# Patient Record
Sex: Female | Born: 1940 | Race: White | Hispanic: No | Marital: Married | Smoking: Never smoker
Health system: Southern US, Community
[De-identification: ages and names within clinical notes are randomized; demographics above are authoritative.]

## PROBLEM LIST (undated history)

## (undated) DIAGNOSIS — C801 Malignant (primary) neoplasm, unspecified: Secondary | ICD-10-CM

## (undated) DIAGNOSIS — Z923 Personal history of irradiation: Secondary | ICD-10-CM

## (undated) DIAGNOSIS — R112 Nausea with vomiting, unspecified: Secondary | ICD-10-CM

## (undated) DIAGNOSIS — E785 Hyperlipidemia, unspecified: Secondary | ICD-10-CM

## (undated) DIAGNOSIS — R002 Palpitations: Secondary | ICD-10-CM

## (undated) DIAGNOSIS — E1140 Type 2 diabetes mellitus with diabetic neuropathy, unspecified: Secondary | ICD-10-CM

## (undated) DIAGNOSIS — I10 Essential (primary) hypertension: Secondary | ICD-10-CM

## (undated) DIAGNOSIS — M24669 Ankylosis, unspecified knee: Secondary | ICD-10-CM

## (undated) DIAGNOSIS — D519 Vitamin B12 deficiency anemia, unspecified: Secondary | ICD-10-CM

## (undated) DIAGNOSIS — Z9889 Other specified postprocedural states: Secondary | ICD-10-CM

## (undated) DIAGNOSIS — M1990 Unspecified osteoarthritis, unspecified site: Secondary | ICD-10-CM

## (undated) HISTORY — PX: JOINT REPLACEMENT: SHX530

## (undated) HISTORY — DX: Personal history of irradiation: Z92.3

## (undated) HISTORY — DX: Malignant (primary) neoplasm, unspecified: C80.1

## (undated) HISTORY — PX: ABDOMINAL HYSTERECTOMY: SHX81

## (undated) HISTORY — PX: OTHER SURGICAL HISTORY: SHX169

## (undated) HISTORY — DX: Hyperlipidemia, unspecified: E78.5

---

## 1974-07-08 HISTORY — PX: TUBAL LIGATION: SHX77

## 1974-07-08 HISTORY — PX: URETHRAL DILATION: SUR417

## 1974-07-08 HISTORY — PX: OTHER SURGICAL HISTORY: SHX169

## 1987-07-09 HISTORY — PX: TOTAL ABDOMINAL HYSTERECTOMY W/ BILATERAL SALPINGOOPHORECTOMY: SHX83

## 1994-07-08 HISTORY — PX: CERVICAL FUSION: SHX112

## 1997-07-08 HISTORY — PX: KNEE ARTHROSCOPY: SUR90

## 1999-07-09 HISTORY — PX: CARDIAC CATHETERIZATION: SHX172

## 1999-09-13 ENCOUNTER — Ambulatory Visit (HOSPITAL_COMMUNITY): Admission: RE | Admit: 1999-09-13 | Discharge: 1999-09-13 | Payer: Self-pay | Admitting: Interventional Cardiology

## 1999-10-01 ENCOUNTER — Encounter: Admission: RE | Admit: 1999-10-01 | Discharge: 1999-12-30 | Payer: Self-pay

## 2000-06-11 ENCOUNTER — Other Ambulatory Visit: Admission: RE | Admit: 2000-06-11 | Discharge: 2000-06-11 | Payer: Self-pay | Admitting: Obstetrics and Gynecology

## 2001-08-27 ENCOUNTER — Encounter: Admission: RE | Admit: 2001-08-27 | Discharge: 2001-11-25 | Payer: Self-pay | Admitting: Internal Medicine

## 2004-08-15 ENCOUNTER — Ambulatory Visit (HOSPITAL_COMMUNITY): Admission: RE | Admit: 2004-08-15 | Discharge: 2004-08-15 | Payer: Self-pay | Admitting: Gastroenterology

## 2006-02-03 ENCOUNTER — Inpatient Hospital Stay (HOSPITAL_COMMUNITY): Admission: RE | Admit: 2006-02-03 | Discharge: 2006-02-06 | Payer: Self-pay | Admitting: Orthopedic Surgery

## 2006-02-03 HISTORY — PX: TOTAL KNEE ARTHROPLASTY: SHX125

## 2006-04-02 ENCOUNTER — Ambulatory Visit (HOSPITAL_COMMUNITY): Admission: RE | Admit: 2006-04-02 | Discharge: 2006-04-02 | Payer: Self-pay | Admitting: Orthopedic Surgery

## 2006-04-02 HISTORY — PX: MANIPULATION KNEE JOINT: SUR841

## 2007-07-08 ENCOUNTER — Encounter: Admission: RE | Admit: 2007-07-08 | Discharge: 2007-07-08 | Payer: Self-pay | Admitting: Internal Medicine

## 2007-07-16 ENCOUNTER — Encounter: Admission: RE | Admit: 2007-07-16 | Discharge: 2007-07-16 | Payer: Self-pay | Admitting: Internal Medicine

## 2007-07-27 ENCOUNTER — Ambulatory Visit (HOSPITAL_COMMUNITY): Admission: RE | Admit: 2007-07-27 | Discharge: 2007-07-27 | Payer: Self-pay | Admitting: General Surgery

## 2007-07-27 ENCOUNTER — Encounter (INDEPENDENT_AMBULATORY_CARE_PROVIDER_SITE_OTHER): Payer: Self-pay | Admitting: General Surgery

## 2007-07-27 HISTORY — PX: BREAST LUMPECTOMY: SHX2

## 2007-08-14 ENCOUNTER — Ambulatory Visit: Payer: Self-pay | Admitting: Oncology

## 2007-08-20 ENCOUNTER — Ambulatory Visit (HOSPITAL_COMMUNITY): Admission: RE | Admit: 2007-08-20 | Discharge: 2007-08-20 | Payer: Self-pay | Admitting: General Surgery

## 2007-08-25 ENCOUNTER — Ambulatory Visit: Admission: RE | Admit: 2007-08-25 | Discharge: 2007-09-22 | Payer: Self-pay | Admitting: Radiation Oncology

## 2007-09-14 LAB — COMPREHENSIVE METABOLIC PANEL
ALT: 15 U/L (ref 0–35)
AST: 17 U/L (ref 0–37)
Albumin: 3.8 g/dL (ref 3.5–5.2)
Alkaline Phosphatase: 56 U/L (ref 39–117)
BUN: 20 mg/dL (ref 6–23)
CO2: 22 mEq/L (ref 19–32)
Calcium: 8.9 mg/dL (ref 8.4–10.5)
Chloride: 104 mEq/L (ref 96–112)
Creatinine, Ser: 0.91 mg/dL (ref 0.40–1.20)
Glucose, Bld: 120 mg/dL — ABNORMAL HIGH (ref 70–99)
Potassium: 4.2 mEq/L (ref 3.5–5.3)
Sodium: 141 mEq/L (ref 135–145)
Total Bilirubin: 0.5 mg/dL (ref 0.3–1.2)
Total Protein: 6.5 g/dL (ref 6.0–8.3)

## 2007-09-14 LAB — CBC WITH DIFFERENTIAL/PLATELET
BASO%: 0.4 % (ref 0.0–2.0)
Basophils Absolute: 0 10*3/uL (ref 0.0–0.1)
EOS%: 1.7 % (ref 0.0–7.0)
Eosinophils Absolute: 0.1 10*3/uL (ref 0.0–0.5)
HCT: 34.2 % — ABNORMAL LOW (ref 34.8–46.6)
HGB: 11.8 g/dL (ref 11.6–15.9)
LYMPH%: 25.8 % (ref 14.0–48.0)
MCH: 30.2 pg (ref 26.0–34.0)
MCHC: 34.3 g/dL (ref 32.0–36.0)
MCV: 87.8 fL (ref 81.0–101.0)
MONO#: 0.5 10*3/uL (ref 0.1–0.9)
MONO%: 6.9 % (ref 0.0–13.0)
NEUT#: 5.1 10*3/uL (ref 1.5–6.5)
NEUT%: 65.2 % (ref 39.6–76.8)
Platelets: 295 10*3/uL (ref 145–400)
RBC: 3.9 10*6/uL (ref 3.70–5.32)
RDW: 13.9 % (ref 11.3–14.5)
WBC: 7.8 10*3/uL (ref 3.9–10.0)
lymph#: 2 10*3/uL (ref 0.9–3.3)

## 2007-09-15 LAB — VITAMIN D 25 HYDROXY (VIT D DEFICIENCY, FRACTURES): Vit D, 25-Hydroxy: 13 ng/mL — ABNORMAL LOW (ref 30–89)

## 2007-09-17 LAB — VITAMIN D 1,25 DIHYDROXY: Vit D, 1,25-Dihydroxy: 25 pg/mL (ref 6–62)

## 2007-09-29 ENCOUNTER — Ambulatory Visit (HOSPITAL_COMMUNITY): Admission: RE | Admit: 2007-09-29 | Discharge: 2007-09-30 | Payer: Self-pay | Admitting: General Surgery

## 2007-09-29 ENCOUNTER — Encounter (INDEPENDENT_AMBULATORY_CARE_PROVIDER_SITE_OTHER): Payer: Self-pay | Admitting: General Surgery

## 2007-09-29 HISTORY — PX: OTHER SURGICAL HISTORY: SHX169

## 2007-12-15 ENCOUNTER — Ambulatory Visit: Payer: Self-pay | Admitting: Oncology

## 2007-12-22 ENCOUNTER — Ambulatory Visit (HOSPITAL_COMMUNITY): Admission: RE | Admit: 2007-12-22 | Discharge: 2007-12-22 | Payer: Self-pay | Admitting: Oncology

## 2007-12-22 LAB — COMPREHENSIVE METABOLIC PANEL
ALT: 19 U/L (ref 0–35)
AST: 15 U/L (ref 0–37)
Albumin: 4.1 g/dL (ref 3.5–5.2)
Alkaline Phosphatase: 54 U/L (ref 39–117)
BUN: 17 mg/dL (ref 6–23)
CO2: 26 mEq/L (ref 19–32)
Calcium: 10.5 mg/dL (ref 8.4–10.5)
Chloride: 103 mEq/L (ref 96–112)
Creatinine, Ser: 0.88 mg/dL (ref 0.40–1.20)
Glucose, Bld: 142 mg/dL — ABNORMAL HIGH (ref 70–99)
Potassium: 4.1 mEq/L (ref 3.5–5.3)
Sodium: 142 mEq/L (ref 135–145)
Total Bilirubin: 0.5 mg/dL (ref 0.3–1.2)
Total Protein: 6.9 g/dL (ref 6.0–8.3)

## 2007-12-22 LAB — CBC WITH DIFFERENTIAL/PLATELET
BASO%: 0.4 % (ref 0.0–2.0)
Basophils Absolute: 0 10*3/uL (ref 0.0–0.1)
EOS%: 0.9 % (ref 0.0–7.0)
Eosinophils Absolute: 0.1 10*3/uL (ref 0.0–0.5)
HCT: 35.6 % (ref 34.8–46.6)
HGB: 12.3 g/dL (ref 11.6–15.9)
LYMPH%: 29.5 % (ref 14.0–48.0)
MCH: 30.4 pg (ref 26.0–34.0)
MCHC: 34.5 g/dL (ref 32.0–36.0)
MCV: 88.1 fL (ref 81.0–101.0)
MONO#: 0.6 10*3/uL (ref 0.1–0.9)
MONO%: 7.6 % (ref 0.0–13.0)
NEUT#: 5.2 10*3/uL (ref 1.5–6.5)
NEUT%: 61.6 % (ref 39.6–76.8)
Platelets: 283 10*3/uL (ref 145–400)
RBC: 4.04 10*6/uL (ref 3.70–5.32)
RDW: 14.6 % — ABNORMAL HIGH (ref 11.3–14.5)
WBC: 8.4 10*3/uL (ref 3.9–10.0)
lymph#: 2.5 10*3/uL (ref 0.9–3.3)

## 2007-12-23 LAB — VITAMIN D 25 HYDROXY (VIT D DEFICIENCY, FRACTURES): Vit D, 25-Hydroxy: 34 ng/mL (ref 30–89)

## 2008-01-19 ENCOUNTER — Encounter: Admission: RE | Admit: 2008-01-19 | Discharge: 2008-01-19 | Payer: Self-pay | Admitting: Oncology

## 2008-07-07 ENCOUNTER — Ambulatory Visit: Payer: Self-pay | Admitting: Oncology

## 2008-07-12 ENCOUNTER — Ambulatory Visit (HOSPITAL_COMMUNITY): Admission: RE | Admit: 2008-07-12 | Discharge: 2008-07-12 | Payer: Self-pay | Admitting: Oncology

## 2008-07-12 LAB — COMPREHENSIVE METABOLIC PANEL
ALT: 19 U/L (ref 0–35)
AST: 20 U/L (ref 0–37)
Albumin: 3.6 g/dL (ref 3.5–5.2)
Alkaline Phosphatase: 55 U/L (ref 39–117)
BUN: 14 mg/dL (ref 6–23)
CO2: 25 mEq/L (ref 19–32)
Calcium: 9 mg/dL (ref 8.4–10.5)
Chloride: 102 mEq/L (ref 96–112)
Creatinine, Ser: 0.86 mg/dL (ref 0.40–1.20)
Glucose, Bld: 207 mg/dL — ABNORMAL HIGH (ref 70–99)
Potassium: 3.6 mEq/L (ref 3.5–5.3)
Sodium: 138 mEq/L (ref 135–145)
Total Bilirubin: 0.7 mg/dL (ref 0.3–1.2)
Total Protein: 6.7 g/dL (ref 6.0–8.3)

## 2008-07-12 LAB — CBC WITH DIFFERENTIAL/PLATELET
BASO%: 0.3 % (ref 0.0–2.0)
Basophils Absolute: 0 10*3/uL (ref 0.0–0.1)
EOS%: 0.9 % (ref 0.0–7.0)
Eosinophils Absolute: 0.1 10*3/uL (ref 0.0–0.5)
HCT: 37.1 % (ref 34.8–46.6)
HGB: 12.5 g/dL (ref 11.6–15.9)
LYMPH%: 30.3 % (ref 14.0–48.0)
MCH: 30 pg (ref 26.0–34.0)
MCHC: 33.8 g/dL (ref 32.0–36.0)
MCV: 88.7 fL (ref 81.0–101.0)
MONO#: 0.6 10*3/uL (ref 0.1–0.9)
MONO%: 7.6 % (ref 0.0–13.0)
NEUT#: 4.6 10*3/uL (ref 1.5–6.5)
NEUT%: 60.9 % (ref 39.6–76.8)
Platelets: 282 10*3/uL (ref 145–400)
RBC: 4.18 10*6/uL (ref 3.70–5.32)
RDW: 14.8 % — ABNORMAL HIGH (ref 11.3–14.5)
WBC: 7.6 10*3/uL (ref 3.9–10.0)
lymph#: 2.3 10*3/uL (ref 0.9–3.3)

## 2008-07-13 LAB — VITAMIN D 25 HYDROXY (VIT D DEFICIENCY, FRACTURES): Vit D, 25-Hydroxy: 39 ng/mL (ref 30–89)

## 2008-07-25 ENCOUNTER — Ambulatory Visit (HOSPITAL_BASED_OUTPATIENT_CLINIC_OR_DEPARTMENT_OTHER): Admission: RE | Admit: 2008-07-25 | Discharge: 2008-07-26 | Payer: Self-pay | Admitting: Plastic Surgery

## 2008-07-25 ENCOUNTER — Encounter (INDEPENDENT_AMBULATORY_CARE_PROVIDER_SITE_OTHER): Payer: Self-pay | Admitting: Plastic Surgery

## 2008-11-28 ENCOUNTER — Ambulatory Visit (HOSPITAL_BASED_OUTPATIENT_CLINIC_OR_DEPARTMENT_OTHER): Admission: RE | Admit: 2008-11-28 | Discharge: 2008-11-29 | Payer: Self-pay | Admitting: Plastic Surgery

## 2008-11-28 ENCOUNTER — Encounter (INDEPENDENT_AMBULATORY_CARE_PROVIDER_SITE_OTHER): Payer: Self-pay | Admitting: Plastic Surgery

## 2008-11-28 HISTORY — PX: OTHER SURGICAL HISTORY: SHX169

## 2008-12-13 ENCOUNTER — Ambulatory Visit: Payer: Self-pay | Admitting: Oncology

## 2008-12-15 LAB — CBC WITH DIFFERENTIAL/PLATELET
BASO%: 0.4 % (ref 0.0–2.0)
Basophils Absolute: 0 10*3/uL (ref 0.0–0.1)
EOS%: 1.2 % (ref 0.0–7.0)
Eosinophils Absolute: 0.1 10*3/uL (ref 0.0–0.5)
HCT: 33.4 % — ABNORMAL LOW (ref 34.8–46.6)
HGB: 11.8 g/dL (ref 11.6–15.9)
LYMPH%: 31.1 % (ref 14.0–49.7)
MCH: 31.4 pg (ref 25.1–34.0)
MCHC: 35.2 g/dL (ref 31.5–36.0)
MCV: 89.1 fL (ref 79.5–101.0)
MONO#: 0.6 10*3/uL (ref 0.1–0.9)
MONO%: 8.6 % (ref 0.0–14.0)
NEUT#: 3.8 10*3/uL (ref 1.5–6.5)
NEUT%: 58.7 % (ref 38.4–76.8)
Platelets: 246 10*3/uL (ref 145–400)
RBC: 3.75 10*6/uL (ref 3.70–5.45)
RDW: 14.7 % — ABNORMAL HIGH (ref 11.2–14.5)
WBC: 6.5 10*3/uL (ref 3.9–10.3)
lymph#: 2 10*3/uL (ref 0.9–3.3)

## 2008-12-15 LAB — COMPREHENSIVE METABOLIC PANEL
ALT: 15 U/L (ref 0–35)
AST: 14 U/L (ref 0–37)
Albumin: 3.6 g/dL (ref 3.5–5.2)
Alkaline Phosphatase: 57 U/L (ref 39–117)
BUN: 14 mg/dL (ref 6–23)
CO2: 25 mEq/L (ref 19–32)
Calcium: 9 mg/dL (ref 8.4–10.5)
Chloride: 102 mEq/L (ref 96–112)
Creatinine, Ser: 0.8 mg/dL (ref 0.40–1.20)
Glucose, Bld: 272 mg/dL — ABNORMAL HIGH (ref 70–99)
Potassium: 4.4 mEq/L (ref 3.5–5.3)
Sodium: 140 mEq/L (ref 135–145)
Total Bilirubin: 0.4 mg/dL (ref 0.3–1.2)
Total Protein: 6.3 g/dL (ref 6.0–8.3)

## 2009-02-02 ENCOUNTER — Encounter: Admission: RE | Admit: 2009-02-02 | Discharge: 2009-02-02 | Payer: Self-pay | Admitting: Oncology

## 2009-07-11 ENCOUNTER — Ambulatory Visit (HOSPITAL_COMMUNITY): Admission: RE | Admit: 2009-07-11 | Discharge: 2009-07-11 | Payer: Self-pay | Admitting: Oncology

## 2009-07-11 ENCOUNTER — Ambulatory Visit: Payer: Self-pay | Admitting: Oncology

## 2009-07-11 LAB — CBC WITH DIFFERENTIAL/PLATELET
BASO%: 0.5 % (ref 0.0–2.0)
Basophils Absolute: 0 10*3/uL (ref 0.0–0.1)
EOS%: 1.5 % (ref 0.0–7.0)
Eosinophils Absolute: 0.1 10*3/uL (ref 0.0–0.5)
HCT: 37.2 % (ref 34.8–46.6)
HGB: 12.7 g/dL (ref 11.6–15.9)
LYMPH%: 31.1 % (ref 14.0–49.7)
MCH: 30.3 pg (ref 25.1–34.0)
MCHC: 34.2 g/dL (ref 31.5–36.0)
MCV: 88.6 fL (ref 79.5–101.0)
MONO#: 0.6 10*3/uL (ref 0.1–0.9)
MONO%: 7.3 % (ref 0.0–14.0)
NEUT#: 4.6 10*3/uL (ref 1.5–6.5)
NEUT%: 59.6 % (ref 38.4–76.8)
Platelets: 312 10*3/uL (ref 145–400)
RBC: 4.2 10*6/uL (ref 3.70–5.45)
RDW: 14.2 % (ref 11.2–14.5)
WBC: 7.7 10*3/uL (ref 3.9–10.3)
lymph#: 2.4 10*3/uL (ref 0.9–3.3)

## 2009-07-11 LAB — COMPREHENSIVE METABOLIC PANEL
ALT: 14 U/L (ref 0–35)
AST: 14 U/L (ref 0–37)
Albumin: 3.8 g/dL (ref 3.5–5.2)
Alkaline Phosphatase: 59 U/L (ref 39–117)
BUN: 10 mg/dL (ref 6–23)
CO2: 21 mEq/L (ref 19–32)
Calcium: 9.8 mg/dL (ref 8.4–10.5)
Chloride: 102 mEq/L (ref 96–112)
Creatinine, Ser: 0.68 mg/dL (ref 0.40–1.20)
Glucose, Bld: 128 mg/dL — ABNORMAL HIGH (ref 70–99)
Potassium: 4.2 mEq/L (ref 3.5–5.3)
Sodium: 140 mEq/L (ref 135–145)
Total Bilirubin: 0.4 mg/dL (ref 0.3–1.2)
Total Protein: 6.3 g/dL (ref 6.0–8.3)

## 2009-07-14 ENCOUNTER — Ambulatory Visit (HOSPITAL_COMMUNITY): Admission: RE | Admit: 2009-07-14 | Discharge: 2009-07-14 | Payer: Self-pay | Admitting: Oncology

## 2009-09-04 IMAGING — PT NM PET TUM IMG SKULL BASE T - THIGH
6 series · 25 of 25 positions shown · non-contrast
Comparison: Chest, abdomen, and pelvis same day.

CLINICAL DATA: Left breast cancer/sarcoma. 
FDG PET-CT TUMOR IMAGING (SKULL BASE TO THIGHS):

Fasting Blood Glucose:  159
TECHNIQUE: 16.9 mCi F-18 FDG were administered via the right antecubital fossa.  Full ring PET imaging was performed from the skull base through the mid-thighs 55 minutes after injection.  CT data was obtained and used for attenuation correction and anatomic localization only.  (This was not acquired as a diagnostic CT examination.)

[Series 1: pet ac · axial · 3.3mm · 4.69mm/px · z∈[-877,-7]mm · 6 of 267 slices shown]
[im 1/267]
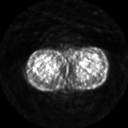
[im 54/267]
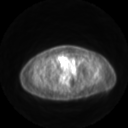
[im 107/267]
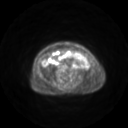
[im 160/267]
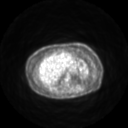
[im 213/267]
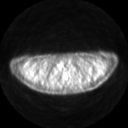
[im 267/267]
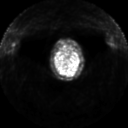

[Series 2: ct images · axial · 3.8mm · 0.98mm/px · z∈[-877,-8]mm · 6 of 267 slices shown]
[im 1/267]
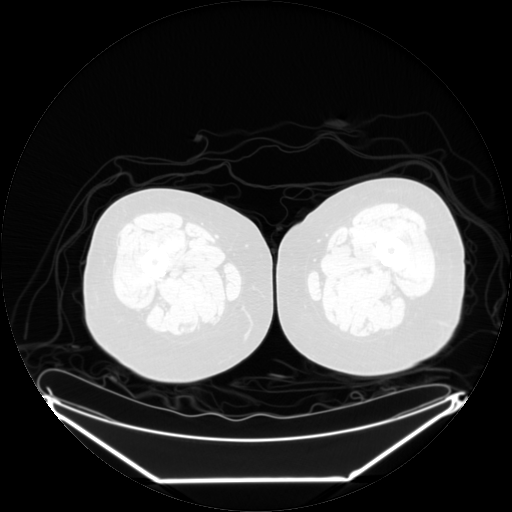
[im 54/267]
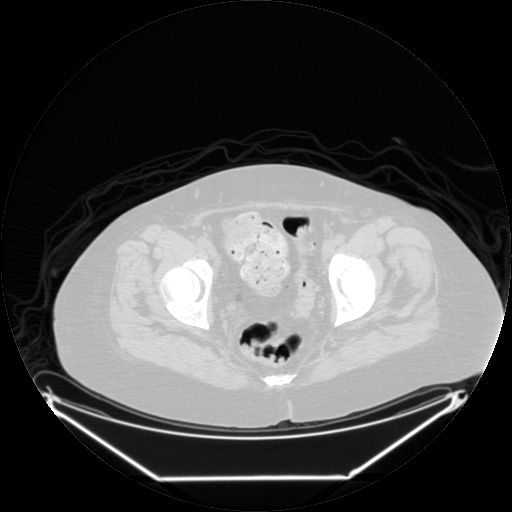
[im 107/267]
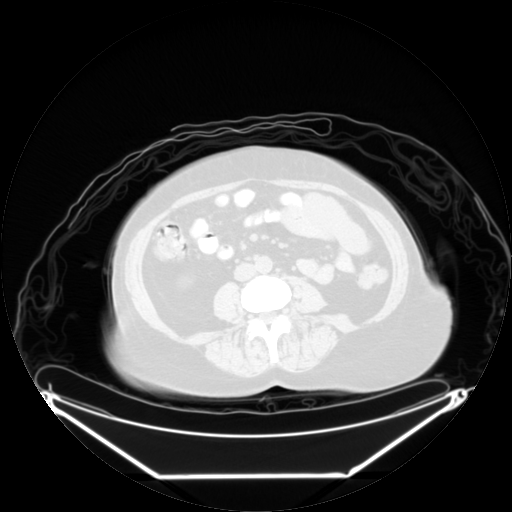
[im 160/267]
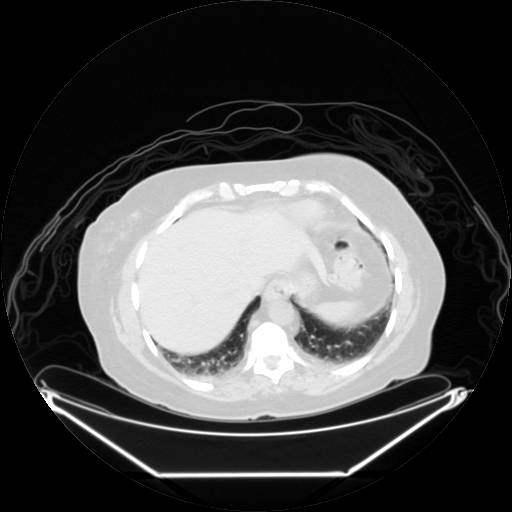
[im 213/267]
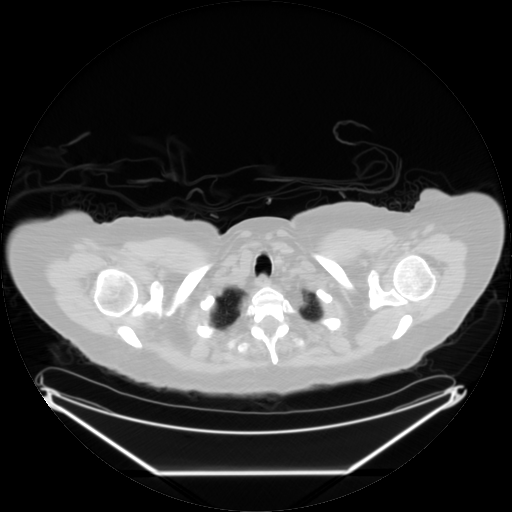
[im 267/267]
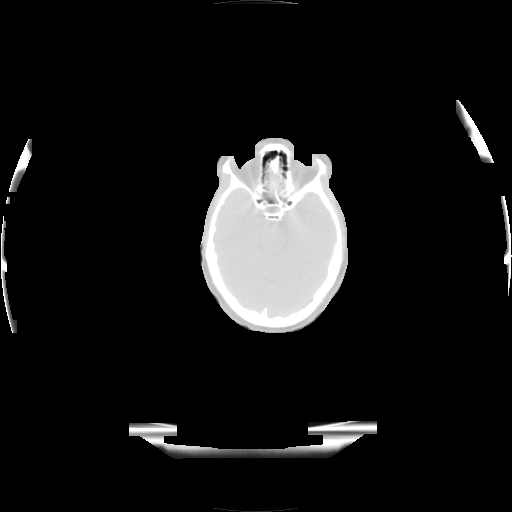

[Series 2: pet nac · axial · 3.3mm · 4.69mm/px · z∈[-877,-7]mm · 6 of 267 slices shown]
[im 1/267]
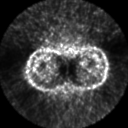
[im 54/267]
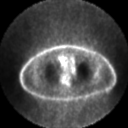
[im 107/267]
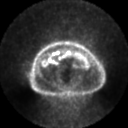
[im 160/267]
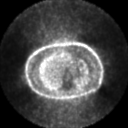
[im 213/267]
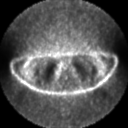
[im 267/267]
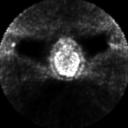

[Series 123: mip · coronal · 3.3mm · 4.69mm/px · 1 of 30 slices shown]
[im 1/30]
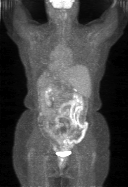

[Series 151: reformatted · axial · 3.3mm · 3.91mm/px · z∈[-786,-43]mm · 5 of 226 slices shown (1 of 2)]
[im 1/226]
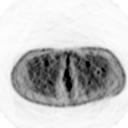
[im 57/226]
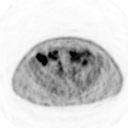
[im 113/226]
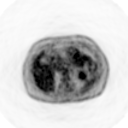
[im 169/226]
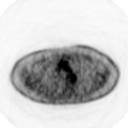
[im 226/226]
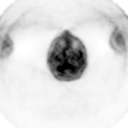

[Series 153: reformatted · coronal · 4.7mm · 6.98mm/px · 1 of 58 slices shown (2 of 2)]
[im 1/58]
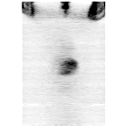

[25 of 25 positions shown; findings below may reference images not displayed]

FINDINGS: Neck:  No hypermetabolic lymph nodes within the neck. 
Chest: There is peripheral metabolic activity associated with the left breast postsurgical fluid collection, which is felt to be postsurgical inflammation.  No evidence of axillary lymphadenopathy.  No evidence of metastatic hypermetabolic lymphadenopathy.  
Abdomen:  No hypermetabolic focal activity within the solid organs. There is focal hypermetabolic activity within the second portion of the duodenum, which is intense (SUV max equal 7.8); however, there is no associated finding on the CT portion of the PET or the contrast enhanced CT of the same day. This is felt to be physiologic activity. Of note, there is intense physiologic activity in more distal small bowel additionally.
No hypermetabolic lymph nodes within the abdomen. 
Pelvis:  No hypermetabolic lymph nodes within the pelvis. 
Skeleton:  No focal activity within the skeleton to suggest skeletal metastasis.
IMPRESSION: 1.  No evidence of breast cancer metastasis. 
2.  Postsurgical hypermetabolic inflammation in the left breast.

## 2010-02-05 ENCOUNTER — Encounter: Admission: RE | Admit: 2010-02-05 | Discharge: 2010-02-05 | Payer: Self-pay | Admitting: Internal Medicine

## 2010-03-29 ENCOUNTER — Ambulatory Visit (HOSPITAL_COMMUNITY): Admission: RE | Admit: 2010-03-29 | Discharge: 2010-03-29 | Payer: Self-pay | Admitting: Gastroenterology

## 2010-05-10 ENCOUNTER — Ambulatory Visit: Payer: Self-pay | Admitting: Oncology

## 2010-05-16 LAB — CBC WITH DIFFERENTIAL/PLATELET
BASO%: 0.3 % (ref 0.0–2.0)
Basophils Absolute: 0 10*3/uL (ref 0.0–0.1)
EOS%: 1.1 % (ref 0.0–7.0)
Eosinophils Absolute: 0.1 10*3/uL (ref 0.0–0.5)
HCT: 38.3 % (ref 34.8–46.6)
HGB: 12.7 g/dL (ref 11.6–15.9)
LYMPH%: 29.3 % (ref 14.0–49.7)
MCH: 29.4 pg (ref 25.1–34.0)
MCHC: 33.1 g/dL (ref 31.5–36.0)
MCV: 88.8 fL (ref 79.5–101.0)
MONO#: 0.6 10*3/uL (ref 0.1–0.9)
MONO%: 7.6 % (ref 0.0–14.0)
NEUT#: 4.9 10*3/uL (ref 1.5–6.5)
NEUT%: 61.7 % (ref 38.4–76.8)
Platelets: 286 10*3/uL (ref 145–400)
RBC: 4.31 10*6/uL (ref 3.70–5.45)
RDW: 13.8 % (ref 11.2–14.5)
WBC: 7.9 10*3/uL (ref 3.9–10.3)
lymph#: 2.3 10*3/uL (ref 0.9–3.3)

## 2010-05-16 LAB — COMPREHENSIVE METABOLIC PANEL
ALT: 21 U/L (ref 0–35)
AST: 23 U/L (ref 0–37)
Albumin: 3.4 g/dL — ABNORMAL LOW (ref 3.5–5.2)
Alkaline Phosphatase: 64 U/L (ref 39–117)
BUN: 15 mg/dL (ref 6–23)
CO2: 28 mEq/L (ref 19–32)
Calcium: 9.6 mg/dL (ref 8.4–10.5)
Chloride: 102 mEq/L (ref 96–112)
Creatinine, Ser: 1.08 mg/dL (ref 0.40–1.20)
Glucose, Bld: 163 mg/dL — ABNORMAL HIGH (ref 70–99)
Potassium: 4 mEq/L (ref 3.5–5.3)
Sodium: 141 mEq/L (ref 135–145)
Total Bilirubin: 0.8 mg/dL (ref 0.3–1.2)
Total Protein: 6.5 g/dL (ref 6.0–8.3)

## 2010-05-17 LAB — VITAMIN D 25 HYDROXY (VIT D DEFICIENCY, FRACTURES): Vit D, 25-Hydroxy: 49 ng/mL (ref 30–89)

## 2010-05-21 ENCOUNTER — Ambulatory Visit (HOSPITAL_COMMUNITY): Admission: RE | Admit: 2010-05-21 | Discharge: 2010-05-21 | Payer: Self-pay | Admitting: Oncology

## 2010-10-16 LAB — GLUCOSE, CAPILLARY
Glucose-Capillary: 180 mg/dL — ABNORMAL HIGH (ref 70–99)
Glucose-Capillary: 190 mg/dL — ABNORMAL HIGH (ref 70–99)
Glucose-Capillary: 217 mg/dL — ABNORMAL HIGH (ref 70–99)
Glucose-Capillary: 228 mg/dL — ABNORMAL HIGH (ref 70–99)
Glucose-Capillary: 235 mg/dL — ABNORMAL HIGH (ref 70–99)
Glucose-Capillary: 327 mg/dL — ABNORMAL HIGH (ref 70–99)

## 2010-10-16 LAB — BASIC METABOLIC PANEL
BUN: 16 mg/dL (ref 6–23)
CO2: 26 mEq/L (ref 19–32)
Calcium: 9.2 mg/dL (ref 8.4–10.5)
Chloride: 102 mEq/L (ref 96–112)
Creatinine, Ser: 0.88 mg/dL (ref 0.4–1.2)
GFR calc Af Amer: >60 mL/min (ref >60)
GFR calc non Af Amer: >60 mL/min (ref >60)
Glucose, Bld: 293 mg/dL — ABNORMAL HIGH (ref 70–99)
Potassium: 4 mEq/L (ref 3.5–5.1)
Sodium: 139 mEq/L (ref 135–145)

## 2010-10-16 LAB — POCT HEMOGLOBIN-HEMACUE: Hemoglobin: 12.6 g/dL (ref 12.0–15.0)

## 2010-10-22 LAB — POCT I-STAT, CHEM 8
BUN: 17 mg/dL (ref 6–23)
Calcium, Ion: 1.17 mmol/L (ref 1.12–1.32)
Chloride: 105 mEq/L (ref 96–112)
Creatinine, Ser: 0.7 mg/dL (ref 0.4–1.2)
Glucose, Bld: 150 mg/dL — ABNORMAL HIGH (ref 70–99)
HCT: 37 % (ref 36.0–46.0)
Hemoglobin: 12.6 g/dL (ref 12.0–15.0)
Potassium: 3.9 mEq/L (ref 3.5–5.1)
Sodium: 140 mEq/L (ref 135–145)
TCO2: 26 mmol/L (ref 0–100)

## 2010-10-22 LAB — GLUCOSE, CAPILLARY: Glucose-Capillary: 143 mg/dL — ABNORMAL HIGH (ref 70–99)

## 2010-10-22 LAB — BASIC METABOLIC PANEL
BUN: 12 mg/dL (ref 6–23)
CO2: 27 mEq/L (ref 19–32)
Calcium: 9.6 mg/dL (ref 8.4–10.5)
Chloride: 104 mEq/L (ref 96–112)
Creatinine, Ser: 0.81 mg/dL (ref 0.4–1.2)
GFR calc Af Amer: >60 mL/min (ref >60)
GFR calc non Af Amer: >60 mL/min (ref >60)
Glucose, Bld: 205 mg/dL — ABNORMAL HIGH (ref 70–99)
Potassium: 4.2 mEq/L (ref 3.5–5.1)
Sodium: 143 mEq/L (ref 135–145)

## 2010-11-20 NOTE — Op Note (Signed)
NAMEPERCY, COMP                   ACCOUNT NO.:  [id number]   MEDICAL RECORD NO.:  [id number]          PATIENT TYPE:  AMB   LOCATION:  DSC                          FACILITY:  [facility]   PHYSICIAN:  Loreta Ave, MD DATE OF BIRTH:  [birth year]   DATE OF PROCEDURE:  11/28/2008  DATE OF DISCHARGE:                               OPERATIVE REPORT   PREOPERATIVE DIAGNOSIS:  Left breast cancer, status post delayed  placement of the tissue expander.   POSTOPERATIVE DIAGNOSIS:  Left breast cancer, status post delayed  placement of the tissue expander.   SURGEON:  Loreta Ave, MD.   PROCEDURE PERFORMED:  1. Exchange of left breast implant and capsulotomy.  2. Right mastopexy.   DRAINS:  None.   COMPLICATIONS:  None.   ESTIMATED BLOOD LOSS:  Minimal.   URINE OUTPUT:  Not recorded.   INTRAVENOUS FLUIDS:  Per Anesthesia.   COMPLICATIONS:  None.   CLINICAL INDICATIONS:  [name] is a 70 year old Caucasian female with  a history of left-sided breast cancer.  She underwent delayed breast  reconstruction with placement of a tissue expander a few months ago.  She has completed her expansion process and presents at this time for  exchange for a silicone breast implant on the left and a right mastopexy  to improve breast asymmetry.   After discussion of the risks of surgery, which included, but not  limited to bleeding, infection, damage to the nearby structures, bad  scarring, ongoing breast asymmetry, delayed healing, implant failure,  migration, capsular contracture or rupture, Carleigh understands these risks  and desires to proceed.   DESCRIPTION OF THE OPERATION:  The patient ambulated to the operating  room and was placed in the supine position on the operating room table.  Preoperatively, she received 1 g of IV antibiotic prophylaxis.  After  smooth and routine induction of general anesthesia with a laryngeal mask  airway, the patient's chest was prepped with ChloraPrep, 3  minutes  elapsed, and the patient's chest was draped in the usual sterile  fashion.  The patient had been marked in the preoperative holding area  for her right mastopexy.  Of note, her left inframammary fold was  approximately 1.5 cm higher than the lateral right and this was marked  as well preoperatively.  The left breast was operated first.   A 6 mL of 0.5% Marcaine plain were injected along the lateral aspect of  the left mastectomy scar.  The scar was excised with a 15-blade and  electrocautery was used to dissect inferiorly and moderately around the  inferior aspect of the pectoralis muscle.  The capsule was entered in  this position with electrocautery and the implant was removed from its  capsule with blunt finger dissection.  Next, electrocautery was used to  release the medial aspect of the capsule and the inferior aspect of the  capsule as well allowing the left breast to medialize and to equal the  location of the inframammary fold on the right side.  Next, a new trial  Allergan implant, reference [id number], serial [id number] was brought  onto  the field and maintained in a bath of bacitracin containing normal  saline.  The left breast pocket was irrigated with the same antibiotic  solution, which was not aspirated.  Gloves and instruments were wiped  clean with the same solution.  Next, the implant was placed in the left  breast defect.  After situating implant, the capsule was closed with  interrupted 3-0 Monocryl sutures.  Care was taken not to touch the  implant with a suture or needle.  At this point, the breast was  inspected and was found to be medialized with a lower inframammary fold  as well.  Next, attention was turned to the right breast.   The projected level of the nipple had been measured preoperatively to  correspond to the anterior most aspect of the reconstructed breast.  A  vertical mastopexy with a superior and medial ellipse was designed.  This allowed the  nipple to migrate medially and superiorly approximately  2.5 cm.  The mastopexy was tailor tacked with 2-0 silk sutures and the  skin marked again.  Next, the sutures were released.  A 38-mm nipple  ring was used to mark the outer aspect of the NAC.  Next, the skin was  incised around the nipple and around the marked areas, both above and  below the nipple-areolar complex.  The skin was then de-epithelialized  with a 10-blade taking care not to injure the nipple.  Next, the dermis  was incised on the parameter of the de-epithelialized portion of the  mastopexy to allow to be mobilized.  Next, the wound was irrigated with  normal saline and hemostasis verified with electrocautery.  Next, the  inferior margin of the mastopexy was closed with interrupted 3-0  Monocryl sutures.  The nipple was then rotated in a counterclockwise  direction superiorly and medially.  The nipple was sewn into place with  interrupted buried 3-0 Monocryl sutures.  Next, a 4-0 running  subcuticular Monocryl stitch was used to close the epidermis starting at  the base of the breast and winding around the nipple.  Sponge and needle  counts were reported as correct x2.  Dermabond and Steri-Strips were  applied to both breasts as well as the circumferential Ace wrap with an  ABD pad in the left axilla to help medialize the left breast.  The  patient was extubated without incident and transported to the recovery  room in stable condition.      Loreta Ave, MD  Electronically Signed     CF/MEDQ  D:  11/28/2008  T:  11/29/2008  Job:  [id number]

## 2010-11-20 NOTE — Group Therapy Note (Signed)
NAME:  [name], FERDINAND                   ACCOUNT NO.:  [id number]   MEDICAL RECORD NO.:  [id number]          PATIENT TYPE:  AMB   LOCATION:  DSC                          FACILITY:  [facility]   PHYSICIAN:  Loreta Ave, MD DATE OF BIRTH:  1941-03-25                                 PROGRESS NOTE   PREOPERATIVE DIAGNOSIS:  Left-sided breast cancer, status post acquired absence of the left  breast.   POSTOPERATIVE DIAGNOSIS:  Left-sided breast cancer, status post acquired absence of the left  breast.   PROCEDURE PERFORMED:  Delayed placement of left breast tissue expander.   SURGEON:  Loreta Ave, MD   ANESTHESIA:  General with LMA.   IV FLUIDS:  1200 cc of crystalloid.   ESTIMATED BLOOD LOSS:  5 cc.   COMPLICATIONS:  None.   DATA REVIEWED:  Jackson-Pratt x2 in the left breast.   CLINICAL IMPRESSION:  [name] is a 70 year old Caucasian female who is status post left  mastectomy.  She presents at this time seeking breast reconstruction via  placement of a tissue expander with subsequent exchange to a permanent  implant in the future.   After discussion of the risks of surgery which include but are not  limited to bleeding, infection, damage to the nearby structures, breast  asymmetry, implant failure or migration and the need for future surgery,  [name] understands these risks and desires to proceed.   DESCRIPTION OF OPERATION:  The patient was brought to the operating room and placed in the supine  position on the operating room table.  After a smooth routine induction  of general anesthesia, the chest was prepped with Betadine and scrub and  paint and draped into a sterile field.  SCDs were on and functioning at  induction.  One gram of Ancef antibiotic was given preoperatively.  Three cc of 0.5% Marcaine with 1:100,000 epinephrine were injected along  the left mastectomy scar.  After waiting for the hemostatic effects of  epinephrine to take place, the medial  portion of the left mastectomy  scar was excised and sent to the Pathology Laboratory labeled left  mastectomy scar.  Electrocautery was used to dissect down to the level  of the pectoralis muscle.  Dissection then proceeded inferiorly and  laterally around the lateral edge of the pectoralis muscle.  The  pectoralis muscle was then raised from lateral to medial, freeing its  costal attachments from lateral to medial to the level of its inferior  attachment at the sternum.  A subpectoral plane was then created with  electrocautery elevating the pectoralis major muscle off the pectoralis  minor muscle and the chest wall.  After assessing the size of the  subpectoral pocket, and confirming that it was large enough for the  proposed tissue expander, the pocket was irrigated with Bacitracin and  polymyxin-containing normal saline and hemostasis was assured with  electrocautery.  One 2-0 Vicryl stitch was placed over a bleeding vessel  at the superolateral aspect of the wound which bled despite  electrocautery.  Next, two 19 [place] round Blake drains were placed in  the  subpectoral plane, one superiorly and one inferiorly via separate  stab incisions.  These were secured to the skin with silk sutures.  Next, Allergan tissue expander, style , reference #[id number], serial  [id number], was brought onto the field and maintained in the same  antibiotic containing solution.  All residual air was aspirated via a 21  gauge butterfly needle on a 60 cc syringe.  The skin was cleansed with  this antibiotic solution and the pocket was once again irrigated without  aspirating the extra solution from the pocket.  Next, the implant was  placed subpectorally with a no-touch technique.  Next, interrupted 3-0  PDS sutures were used in a figure of eight fashion to tack the free edge  of the pectoralis muscle to the inferior mastectomy flap which had been  raised during dissection of the pectoralis muscle.   There was complete  coverage of the implant with muscle.  Care was taken not to touch the  implant with suture or needle at any time.  Next, the deep dermis was  closed with 3-0 PDS interrupted buried sutures.  Next, the superficial  dermis was closed with interrupted, buried 3-0 Monocryl sutures.  The  sponge and needle counts were reported as correct x2.  Dermabond and  Steri-Strips were applied.  The patient was extubated and transported to  the recovery room in stable condition.      Loreta Ave, MD  Electronically Signed     CF/MEDQ  D:  07/25/2008  T:  07/25/2008  Job:  [id number]

## 2010-11-20 NOTE — Op Note (Signed)
NAMEJANEAN, EISCHEN                   ACCOUNT NO.:  [id number]   MEDICAL RECORD NO.:  [id number]          PATIENT TYPE:  AMB   LOCATION:  DAY                          FACILITY:  [facility]   PHYSICIAN:  Sharlet Salina T. Hoxworth, M.D.DATE OF BIRTH:  10-Jan-1941   DATE OF PROCEDURE:  07/27/2007  DATE OF DISCHARGE:                               OPERATIVE REPORT   PREOPERATIVE DIAGNOSIS:  Phylloides tumor, left breast.   POSTOPERATIVE DIAGNOSIS:  Phylloides tumor, left breast.   SURGICAL PROCEDURES:  Left breast lumpectomy.   SURGEON:  Lorne Skeens. Hoxworth, M.D.   ANESTHESIA:  General.   BRIEF HISTORY:  Desarae Placide is a 70 year old female who recently presented  with a palpable mass in the upper left breast.  Imaging has revealed a  2.5-cm mass at the 11-12 o'clock position in the left breast, and large  core needle biopsy has revealed a phylloides tumor.  I have recommended  proceeding with lumpectomy was negative margins.  Nature of procedure,  indications, risks of bleeding, infection, and possible need for further  surgery based on final pathology findings have been discussed and  understood.  Patient now brought to the operating room for this  procedure.   DESCRIPTION OF OPERATION:  The patient brought to the operating room and  placed in supine position on the operating table, and laryngeal mask  general anesthesia was induced.  The left breast was widely sterilely  prepped and draped.  She received preoperative IV antibiotics.  Correct  patient and procedure were verified.  The mass was easily palpable at  the upper left breast.  A curvilinear incision was made directly over  the mass and dissection carried down into the subcutaneous tissue.  Using cautery dissection the mass was then completely excised with gross  normal margins in all directions.  The mass was fairly deep although not  fixed and it was dissected up off the pectoralis major taking the fascia  beneath the mass.  The  superior margin looked possibly a little closely  first and then I did take an extra margin of subcutaneous tissue as a  further margin here.  This was all attached to the specimen.  It  appeared grossly well excised.  Complete hemostasis was obtained in the  wound which was thoroughly irrigated.  The soft tissue was infiltrated  with Marcaine, no epinephrine.  The subcutaneous was then closed with  interrupted 4-0 Monocryl, and the skin with running subcuticular 4-0  Monocryl and Dermabond.  Sponge and needle counts correct.  The patient  taken to recovery in good condition.      Lorne Skeens. Hoxworth, M.D.  Electronically Signed     BTH/MEDQ  D:  07/27/2007  T:  07/27/2007  Job:  [id number]

## 2010-11-20 NOTE — Op Note (Signed)
[name], [name]                   ACCOUNT NO.:  [id number]   MEDICAL RECORD NO.:  [id number]          PATIENT TYPE:  OIB   LOCATION:  0098                         [facility]ACILITY:  [facility]   PHYSICIAN:  Sharlet Salina T. Hoxworth, M.D.DATE OF BIRTH:  1941-02-19   DATE OF PROCEDURE:  09/29/2007  DATE OF DISCHARGE:                               OPERATIVE REPORT   PREOPERATIVE DIAGNOSES:  Sarcoma of left breast.   POSTOPERATIVE DIAGNOSIS:  Sarcoma of left breast.   SURGICAL PROCEDURES:  Left total mastectomy.   SURGEON:  Lorne Skeens. Hoxworth, M.D.   ANESTHESIA:  General.   BRIEF HISTORY:  [name] is a 70 year old female.  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  The lumpectomy margins were negative, but after referral  to medical and radiation oncology, it was felt with this unusual high-  grade tumor that mastectomy offered the best chance at local control.  This has been discussed with the patient who is agreeable and she is now  brought to the operating room for left total mastectomy.  The nature of  the procedure, its indications, risks of bleeding, infection, expected  recovery were discussed and understood.   DESCRIPTION OF OPERATION:  The patient was brought to the operating room  and placed in the supine position on the operating table and general  endotracheal anesthesia was induced.  The left chest and arm were widely  sterilely prepped and draped.  She received preoperative IV antibiotics.  Correct patient and procedure were verified.  An elliptical incision was  used transversely encompassing the nipple-areolar complex and the  previous lumpectomy incision.  This dissection was carried down to the  subcutaneous tissue.  Skin and subcutaneous flaps were then raised  superiorly toward the clavicle, medially toward the sternum, inferiorly  toward the origin of the  rectus muscles and laterally out to the border  of the latissimus.  The breast and fascia were then reflected up off the  pectoral muscle working medial to lateral.  The chest was dissected off  the serratus muscles.  The edge of the pectoralis major was freed and  the pectoralis minor identified, the clavipectoral fascia incised and  the specimen freed from the pectoralis minor.  Further dissection  inferior and laterally was used to clear the specimen off of the  serratus and latissimus and it was freed up to the axilla and at the low  axilla the specimen was divided between [facility] clamps and removed and  tied with 3-0 Vicryl.  The wound was then thoroughly irrigated with warm  saline and complete hemostasis obtained.  Closed suction drain was left  through a lateral stab wound underneath both flaps and the skin was  closed with staples.  Sponge and needle counts correct.  Dry sterile  dressing was applied.  The patient taken recovery in good condition.     Lorne Skeens. Hoxworth, M.D.  Electronically Signed    BTH/MEDQ  D:  09/29/2007  T:  09/29/2007  Job:  [id number]

## 2010-11-23 NOTE — H&P (Signed)
[place]. [facility]  Patient:    [name], [name]                          MRN: [id number] Adm. Date:  [id number] Attending:  Lyn Records. Iii Dictator:   Anselm Lis, N.P. CC:         Pearla Dubonnet, M.D.                         History and Physical  PRIMARY CARE Kataleena Holsapple:  Pearla Dubonnet, M.D. DD:  09/13/99 TD:  09/13/99 Job: [id number]

## 2010-11-23 NOTE — Cardiovascular Report (Signed)
[place]. [facility]  Patient:    [name], [name]                            MRN: [id number] Proc. Date: 09/13/99 Attending:  Darci Needle, M.D. CC:         Pearla Dubonnet, M.D.             Retta Mac, M.D.; [facility] [place], [place]                        Cardiac Catheterization  CINE NO.:  [phone].  PROCEDURES PERFORMED: 1. Left heart catheterization. 2. Selective coronary angiography. 3. Left ventriculography. 4. Perclose arteriotomy closure.  INDICATIONS:  The patient has high cholesterol and had an abnormal stress echocardiogram study done, suggesting inferolateral ischemia.  This study is being done to rule out coronary disease.  DESCRIPTION OF PROCEDURE:  After an informed consent was obtained, a 6-French sheath was started in the right femoral artery using the modified Seldinger technique.  A 6-French A2 multipurpose catheter was used for hand injection of hemodynamic recordings, left ventriculography, and left coronary angiography.  Right coronary angiography was also performed.  Engagement of the right coronary artery caused proximal kinking at the catheter tip.  We ultimately used a no-torque 6-French right coronary catheter, and angiography was completed.  Perclose was sed for arteriotomy closure, without complications.  A right femoral angiogram was performed by hand injection through the sheath prior to Perclose; documenting absence of bifurcation or other anatomic abnormalities.  HEMODYNAMIC DATA: 1. Aortic pressure: 141/79. 2. Left ventricular pressure:  131/9 mmHg.  LEFT VENTRICULOGRAPHY:  Normal in size and normal contractility.  The ejection fraction was 65%.  No mitral regurgitation.  CORONARY ANGIOGRAPHY: 1. LEFT MAIN: Normal. 2. LEFT ANTERIOR DESCENDING:  Was a large vessel that wraps around the left    ventricular apex.  It gives origin to three small diagonal branches.  No    abnormalities are noted. 3. CIRCUMFLEX  ARTERY:  Is a large vessel, giving origin to a dominant obtuse    marginal branch.  No abnormalities are noted in the circumflex system. 4. RIGHT CORONARY:  A dominant vessel, giving origin to the PDA and a    bifurcating branch off the PDA.  No significant abnormalities are noted in    the right coronary.  CONCLUSIONS: 1. Essentially normal coronary arteries for age.  Minimal plaquing is noted in    the LAD, circumflex and right coronary.  No gradable lesions are present. 2. Normal left ventricular function. 3. Perclose arteriotomy closure.  RECOMMENDATION:  No further cardiac evaluation. DD:  09/13/99 TD:  09/14/99 Job: [id number]

## 2010-11-23 NOTE — H&P (Signed)
[name], [name]                   ACCOUNT NO.:  [id number]   MEDICAL RECORD NO.:  [id number]           PATIENT TYPE:   LOCATION:                                 FACILITY:   PHYSICIAN:  Ollen Gross, M.D.    DATE OF BIRTH:  1940/07/24   DATE OF ADMISSION:  02/03/2006  DATE OF DISCHARGE:                                HISTORY & PHYSICAL   CHIEF COMPLAINT:  Right knee pain.   HISTORY OF PRESENT ILLNESS:  This 70 year old female has been seen by Dr.  Lequita Halt for ongoing right knee pain.  She has known arthritis with bone-on-  bone in medial compartment with also patellofemoral changes and moderate  lateral compartment arthritis.  It has been refractory to nonoperative  management.  She is seen by Dr. Lequita Halt, would like to proceed to surgery.  Risks and benefits discussed.  The patient was subsequently admitted to the  hospital.  She has been seen preoperatively by Dr. Johnella Moloney and felt  medically stable for her right knee surgery.   ALLERGIES:  1. SULFA CAUSES A RASH.  2. IVP DYE CAUSES NAUSEA AND VOMITING.  3. BYETTA CAUSED SEVERE NAUSEA.   CURRENT MEDICATIONS:  Actos, Estrace, metformin, Crestor, glimepiride,  lisinopril, also a history of iron supplement, Claritin and aspirin.   PAST MEDICAL HISTORY:  1. Hypertension.  2. Hypercholesterolemia.  3. History of diverticulosis.  4. Noninsulin dependent diabetes mellitus.  5. Vitamin B12 deficiency anemia.  6. Postmenopausal, currently on hormone replacement therapy.  7. Mild obesity.  8. SEASONAL ALLERGIC RHINITIS.  9. Cervical degenerative disk disease.  10.History of actinic keratoses.   PAST SURGICAL HISTORY:  1. Colonoscopy, 2006.  2. Anterior cervical surgery, 1996.  3. Right knee surgery, which was an arthroscopy.  4. Hysterectomy.  5. Tubal ligation.  6. Breast biopsy.   SOCIAL HISTORY:  Married, retired, nonsmoker, no alcohol.  Two children.   FAMILY HISTORY:  Father deceased at age 12 with history of  stroke.  Mother  deceased at age 28 with MI and diabetes.  Sister deceased age 56 with  history of stomach cancer, another sister with diabetes and a niece with  ovarian cancer.   REVIEW OF SYSTEMS:  GENERAL:  No fevers, chills, night sweats.  NEURO:  No  seizures, syncope, paralysis.  RESPIRATORY:  No shortness of breath,  productive cough or hemoptysis.  CARDIOVASCULAR:  No chest pain, no  orthopnea.  GI:  No nausea, vomiting, diarrhea, constipation.  GU:  No  dysuria, hematuria or discharge.  MUSCULOSKELETAL:  Right knee.   PHYSICAL EXAM:  VITAL SIGNS:  Pulse 98, respirations 14, blood pressure  122/78.  GENERAL:  A 70 year old white female well-nourished and well-developed, in  no acute distress.  She is alert, oriented, cooperative.  HEENT:  Normocephalic, atraumatic.  Pupils are round and reactive.  Oropharynx clear, EOMs intact.  NECK:  Supple.  She does have a lower partial plate.  Neck supple.  CHEST:  Clear.  Anterior and posterior chest walls no rhonchi, rubs or  wheezing.  HEART:  Regular rate and  rhythm.  S1 and S2 noted.  No rubs, thrills,  palpitations or murmur.  ABDOMEN:  Soft, nontender, bowel sounds present.  Slightly round.  RECTAL/BREASTS/GENITALIA:  Not done.  Not pertinent to present illness.  EXTREMITIES:  Right knee.  Right knee shows range of motion from 5-110,  trace effusion, patellofemoral crepitus noted on passive range of motion.   IMPRESSION:  1. Osteoarthritis right knee.  2. Type 2 diabetes mellitus.  3. Hypertension.  4. Mild obesity.  5. Postmenopausal and currently on hormone replacement therapy.  6. Hypercholesterolemia.  7. SEASONAL ALLERGIC RHINITIS.  8. Cervical degenerative disk disease.  9. History of diverticulosis.  10.History of actinic keratoses.  11.History of a B12 deficiency, anemia.   PLAN:  Patient admitted to [facility] to undergo a right total  knee arthroplasty.  Surgery will be performed by Dr. Ollen Gross.   Her  medical physician is Dr. Johnella Moloney.  He will be notified of the  admission and be consulted, if needed, for medical assistance with the  patient throughout the hospital course.     Alexzandrew L. Julien Girt, P.A.      Ollen Gross, M.D.  Electronically Signed   ALP/MEDQ  D:  02/02/2006  T:  02/02/2006  Job:  [id number]

## 2010-11-23 NOTE — Discharge Summary (Signed)
[name], [name]                   ACCOUNT NO.:  [id number]   MEDICAL RECORD NO.:  [id number]          PATIENT TYPE:  INP   LOCATION:  1505                         FACILITY:  [facility]   PHYSICIAN:  Ollen Gross, M.D.    DATE OF BIRTH:  09-11-1940   DATE OF ADMISSION:  02/03/2006  DATE OF DISCHARGE:  02/06/2006                                 DISCHARGE SUMMARY   Make sure the H&P that is on her chart also goes to [employer].  I do not  see where it was sent over to him so copy of the H&P to [employer].   ADMITTING DIAGNOSES:  1. Osteoarthritis right knee.  2. Type 2 diabetes mellitus.  3. Hypertension.  4. Mild obesity.  5. Postmenopausal currently on hormone replacement therapy.  6. Hypercholesterolemia.  7. Seasonal allergic rhinitis.  8. Cervical degenerative disk disease.  9. History of diverticulosis.  10.History of actinic keratoses.  11.History of B12 deficiency anemia.   DISCHARGE DIAGNOSES:  1. Osteoarthritis right knee status post right total knee arthroplasty.  2. Mild acute blood loss anemia, did not require transfusion.  3. Postoperative hyponatremia, improving.  4. Type 2 diabetes mellitus.  5. Hypertension.  6. Mild obesity.  7. Postmenopausal currently on hormone replacement therapy.  8. Hypercholesterolemia.  9. Seasonal allergic rhinitis.  10.Cervical degenerative disk disease.  11.History of diverticulosis.  12.History of actinic keratoses.  13.History of B12 deficiency anemia.  14.Urethral stricture status post stricture dilatation.   PROCEDURES:  February 03, 2006 right total knee.  Surgeon Dr. Lequita Halt.  Assistant Avel Peace, P.A.-C.  Anesthesia spinal.   CONSULTS:  Urology, Dr. Vernie Ammons   BRIEF HISTORY:  [name] is a 70 year old female with end-stage arthritis of  the right knee with intractable pain failed on management.  Now presents for  total knee arthroplasty.   LABORATORY DATA:  CBC on admission showed hemoglobin 12.3, hematocrit of  36.6, white count of 6.3.  Serial CBCs were followed.  Hemoglobin dropped  down to 10 __________.  Last noted H&H was 9.3 and 27.4.  PT/PTT  preoperative 13.3 and 29, respectively.  INR 1.  Serial pro times followed.  Last noted PT/INR 20 and 1.6.  Chem panel on admission:  Elevated glucose  177, low albumin of 3.3.  Remaining chem panel all within normal limits.  Serial BMETs are followed.  Sodium did drop 139 to 133, back up to 134.  UA:  Trace hemoglobin, small leukocyte esterase, 3-6 white cells, 3-6 red cells,  rare epithelials.  Blood group type A+.   Chest x-ray two-views January 23, 2006:  No acute cardiopulmonary disease.   EKG January 23, 2006:  Normal sinus rhythm, low voltage QRS.  No previous EKGs  available.  Confirmed by Dr. Dani Gobble.   HOSPITAL COURSE:  Patient admitted to [facility].  Taken to the  OR.  Underwent above stated procedure without complication.  Patient  tolerated procedure well.  At the beginning of surgery she had a difficult  Foley placement, therefore, urology consult was called for difficult Foley  insertion.  Found to have urethral stricture.  She underwent dilatation and  placement of Foley.  I did not recommend any further treatment.  Later  returned to recovery room, then orthopedic floor.  Started on PCA and  analgesic pain control following surgery.  Seen on the morning of day one.  Had a little bit of low urinary output.  Was given fluids.  Had some  hyponatremia.  Fluids were switched over.  Hemovac drain placed at time of  surgery was pulled without difficulty.  Started getting up out of bed to a  chair.  Did a few steps.  By day two she was found to have been hurting in  the machine the night before and did not tolerate well so therefore we  discontinued the machine but she continued with physical therapy for  aggressive range of motion and gait training.  She actually did very well  with her physical therapy and ambulated  approximately 80 feet and then later  100 feet on day two.  Dressing was changed on day two.  Incision looked  good.  By day three she was doing well, tolerating her medications, and was  discharged home.   DISCHARGE PLAN:  Patient discharged home on February 06, 2006.   DISCHARGE DIAGNOSES:  Please see above.   DISCHARGE MEDICATIONS:  Coumadin, Percocet, Robaxin, Nu-Iron.   FOLLOW-UP:  Two weeks.   ACTIVITY:  Total knee protocol.  Weightbearing as tolerated.  Home health  PT, home health nursing.   DISPOSITION:  Home.   CONDITION ON DISCHARGE:  Improved.      Alexzandrew L. Julien Girt, P.A.      Ollen Gross, M.D.  Electronically Signed    ALP/MEDQ  D:  02/06/2006  T:  02/06/2006  Job:  [id number]   cc:   Candyce Churn, M.D.  Fax: [phone]   Veverly Fells. Vernie Ammons, M.D.  Fax: [phone]

## 2010-11-23 NOTE — Op Note (Signed)
NAMESTEPHANA, [name]                   ACCOUNT NO.:  [id number]   MEDICAL RECORD NO.:  [id number]          PATIENT TYPE:  INP   LOCATION:  0003                         [facility]ACILITY:  [facility]   PHYSICIAN:  Ollen Gross, M.D.    DATE OF BIRTH:  09-24-1940   DATE OF PROCEDURE:  02/03/2006  DATE OF DISCHARGE:                                 OPERATIVE REPORT   PREOPERATIVE DIAGNOSIS:  Osteoarthritis, right knee.   POSTOPERATIVE DIAGNOSIS:  Osteoarthritis, right knee.   PROCEDURE:  Right total knee arthroplasty.   SURGEON:  Ollen Gross, M.D.   ASSISTANT:  Alexzandrew L. Julien Girt, P.A.   ANESTHESIA:  Spinal.   ESTIMATED BLOOD LOSS:  Minimal.   DRAIN:  Hemovac x1.   TOURNIQUET TIME:  35 minutes 300 mmHg.   COMPLICATIONS:  None.   CONDITION:  Stable to recovery.   BRIEF CLINICAL NOTE:  [name] is a 70 year old female with end-stage  arthritis of the right knee with intractable pain.  She has failed  nonoperative management and presents for total knee arthroplasty.   PROCEDURE IN DETAIL:  After successful administration of spinal anesthetic,  a tourniquet placed on the right thigh and right lower extremity prepped and  draped in the usual sterile fashion.  Extremity was wrapped in Esmarch, knee  flexed, tourniquet inflated to 300 mmHg.  Midline incision is made with a 10  blade through subcutaneous tissue to the level of the extensor mechanism.  Fresh blade is used to make a medial parapatellar arthrotomy.  Soft tissue  of the proximal medial tibia was subperiosteally elevated to the joint line  with the knife and into the semimembranosus bursa with a Cobb elevator.  Soft tissue of the proximal lateral tibia is elevated attention being paid  to avoid patellar tendon on tibial tubercle.  Patella subluxed laterally,  knee flexed 90 degrees, ACL and PCL removed.  Drill was used for a starting  hole in the distal femur canal was thoroughly irrigated.  5 degrees right  valgus alignment  guide is placed referencing off the posterior condyles  rotations marked and a block pinned to remove 11 mm of the distal femur.  I  took 11 because of flexion contracture.  Distal femoral resection is made  with an oscillating saw.  Sizing blocks placed and size 3 is most  appropriate.  Size 3 cutting block is placed and the anterior, posterior and  chamfer cuts were made.   Tibia subluxed forward and the menisci removed.  Extramedullary tibial  alignment guide is placed referencing proximally at the medial aspect of the  tibial tubercle and distally along the second metatarsal axis and tibial  crest.  Blocks pinned to remove 10 mm off the non deficient lateral side.  Tibial resection is made with an oscillating saw.  Size 3 was the most  appropriate tibial component and the proximal tibia was prepared with the  modular drill and keel punch for a size 3.  Femoral preparation is completed  the intercondylar cut.   Size 3 mobile bearing tibial trial with a size 3 posterior  stabilized  femoral trial and a 10 mm posterior stabilized rotating platform insert  trial are placed.  With the 10, full extension was achieved with excellent  varus valgus balance throughout full range of motion.  Patella was everted,  thickness measured to be 17 mm.  She had a lot of central erosion of the  patella.  She also had a bipartite patella.  Resected down to 11 mm  thickness.  I removed the bipartite component.  35 patella was most  suitable.  A trial template is placed, lug holes were drilled, trial patella  was placed and it tracks normally.  Osteophytes and remove the posterior  femur with the trial placed.  All trials were removed and the cut bone  surfaces are prepared with pulsatile lavage.  Cement is mixed and once ready  for implantation, the size 3 mobile bearing tibial tray, size 3 posterior  stabilized femur and the 35 patella are cemented into place.  The patella  was held the clamp.  Trial  10-mm inserts placed, knee held in full extension  and all extruded cement removed.  Once the cement fully hardened then the  permanent 10 mm posterior stabilized rotating platform insert is placed into  the tibial tray.  Wound was copiously irrigated with saline solution and the  extensor mechanism closed over Hemovac drain with interrupted #1 PDS.  Flexion against gravity to 135 degrees.  Tourniquet was released with a  total time of 35 minutes.  Subcu closed with interrupted 2-0 Vicryl and  subcuticular running 4-0 Monocryl.  Incisions cleaned and dried and Steri-  Strips and bulky sterile dressing applied.  Drains hooked to suction and she  is placed into a knee immobilizer, awakened, transferred to recovery in  stable condition.      Ollen Gross, M.D.  Electronically Signed     FA/MEDQ  D:  02/03/2006  T:  02/03/2006  Job:  [id number]

## 2010-11-23 NOTE — H&P (Signed)
[place]  Patient:    [name], [name]                          MRN: [id number] Adm. Date:  [id number] Attending:  Lyn Records. Iii Dictator:   Anselm Lis, N.P. CC:         Pearla Dubonnet, M.D.             Darci Needle, M.D.                         History and Physical  PRIMARY CARE Keanon Bevins:  Pearla Dubonnet, M.D.  NARRATIVE:  Ms. Biddinger is a very pleasant 70 year old diabetic female with history of hypertension, who was seen by cardiologist in [facility] (Dr. Luberta Robertson) for management of dyslipidemia.  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  The patient was counseled o undergo diagnostic coronary angiography, with possible percutaneous intervention. She was referred to [facility] as she wished to have the procedure done here in [place] with Dr. Verdis Prime.  The patient is without symptoms of angina, nor does she have DOE; though she did have episode of atypical chest discomfort; described as anterior chest tightness with some radiation to left lateral side, not clearly related to exertion. Otherwise, denies orthopnea, PND, and pedal edema.  MEDICAL HISTORY: 1. Dyslipidemia (markedly elevated LDL greater than 2300; decreased HDL of 9.0,    corrected with Zocor and Niaspan). 2. Diabetes mellitus type 2 x 5 years. 3. Knee injury with subsequent right knee arthroscopy, March 1999 -- good results.    Cannot totally straighten that right leg out. 4. Hysterectomy 1989, with bilateral salpingo-oophorectomy, secondary to atypical    cells. 5. Left breast biopsy x 2 -- 1976. 6. Urethral dilation -- 1976. 7. Cervical fusion -- 1996; with donor from left hip site. 8. Denies problems with hypertension, thyroid disease nor asthma.  ALLERGIES:  IVP DYE (causing nausea and  emesis; sulfa causing a rash).  MEDICATIONS: 1. Enteric-coated aspirin 325 mg po q.d. (since September 06, 1999). 2. Toprol 50 mg 1/2 tablet q.d. (since September 06, 1999). 3. Estraderm patch twice weekly. 4. Glucophage 1 g p.o. b.i.d. (last dose evening of September 12, 1999). 5. Glucotrol 5 mg p.o. q.h.s. (last dose last evening). 6. Allegra p.r.n. 7. Niaspan 1 g q.h.s. 8. Zocor 20 mg q.h.s. 9. Prednisone 60 mg (last evening at 5 p.m. and 60 mg this morning;    pre-catheterization medication in view of history of prior nausea    and emesis, with exposure to IVP dye).  HABITS:  ETOH/tobacco negative.  Caffeine not excessive.  SOCIAL HISTORY:  The patient has been married for 27 years.  Has one child with  this marriage and a child from a previous marriage; without coronary artery disease.  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  FAMILY HISTORY:  Father is deceased at age 87 of stroke with hypertension. Mother deceased at age 33 of myocardial infarction; she did suffer with diabetes mellitus type 2 and may have had a prior MI.  Two brothers; one died at nine months of colitis, one alive at age 65 (is a bilateral amputee and has diabetes mellitus ype 1).  Three sisters; one died at 41 of stomach  cancer, the other two are doing fine but have high blood pressure.  REVIEW OF SYSTEMS:  Does wear glasses.  She denies problems with hearing. ______ ______ ______ ______ fluid.  Negative symptoms of GERD.  Negative constipation, diarrhea, melena, bright red blood per rectum.  Negative dysuria, no hematuria or PA.  Negative abdominal pain.  She does suffer from arthritis, both thumbs and the right knee.  Complains of numbness/tingling of the feet bilaterally, thought secondary to diabetes.  Negative for pedal edema, orthopnea and PND (as  described above).  PHYSICAL EXAMINATION:  VITAL SIGNS:  Blood pressure 129/67, heart rate 91, respiratory rate  18, afebrile. Height 5 feet 7.5 inches, weight 161 pounds.  GENERAL:  She is a well-nourished, pleasant and conversant 70 year old female; n no apparent distress.  She is accompanied today by her husband and friend.  HEENT:  Brisk bilateral carotid upstroke, without bruits.  NECK:  Supple, no evidence of JVD.  No thyromegaly.  CHEST:  Lung sounds clear after cough, with equal bilateral excursion. Negative CPA tenderness.  CARDIAC:  Regular rate and rhythm without murmurs, rubs or gallops.  Normal S1 nd S2.  ABDOMEN:  Soft, nondistended, normoactive bowel sounds.  Negative abdominal aorta. No left femoral bruit.  Negative masses or organomegaly.  Nontender to palpation.  EXTREMITIES:  Bilateral radial, femoral, dorsalis pedis pulses +2/4; and +1/4 bilateral posterior tibial.  Negative pedal edema.  NEUROLOGIC:  Cranial nerves II-XII grossly intact; alert and oriented x3.  GENITAL/RECTAL:  Deferred.  LABORATORY DATA:  Stress echocardiogram (September 06, 1999; by Dr. Retta Mac) -- Revealed questioned old inferobasilar and inferolateral scar.  Questioned anterolateral ischemia.  EKG (September 11, 1999) -- Revealed sinus tachycardia at 101 beats per minute, with  small Q-wave inferior leads; otherwise without ischemic changes.  (September 07, 1999) -- Hemoglobin A1C 6.2.  LFT within normal range.  TSH within normal range at 2.07.  (September 13, 1999) -- Prothrombin time 13.1, PTT 27, INR 1.0.  Hemoglobin 13.5, hematocrit 40.0, WC 12.4 (elevated from 8.1 from prior; probably secondary to prednisone), platelets 329.  Sodium 137, potassium 3.8, chloride 102, CO2 27, BUN 16, creatinine 0.8, glucose 150.  Calcium 9.4.  IMPRESSION: 76. A 70 year old diabetic female with history of dyslipidemia (corrected on    antihyperlipidemics), who is status post recent stress echocardiogram    (Dr. Luberta Robertson) which was suspicious for old inferobasilar and inferolateral scar    and possible  anterolateral wall ischemia. 2. Dyslipidemia.  Zocor and Niaspan. 3. Diabetes mellitus.  Recent hemoglobin A1C okay at 6.4.  4. Question allergy to IVP dye (has been premedicated with prednisone 60 mg last    night and again this morning; will get Benadryl on call).  PLAN:  The patient has been counseled to undergo, and has accepted plans for, cardiac catheterization and coronary angiography; with possible percutaneous intervention if indicated and able.  Risks of potential complications, benefits and alternatives of the procedure were discussed in detail.  Ms. Wilmouth and her husband indicate the questions and concerns have been addressed; are agreeable to proceed.  DD:  09/13/99 TD:  09/13/99 Job: [id number]

## 2010-11-23 NOTE — Op Note (Signed)
NAMEHAJER, DWYER                   ACCOUNT NO.:  [id number]   MEDICAL RECORD NO.:  [id number]          PATIENT TYPE:  AMB   LOCATION:  DAY                          FACILITY[facility]  [facility]   PHYSICIAN:  Ollen Gross, M.D.    DATE OF BIRTH:  13-Dec-1940   DATE OF PROCEDURE:  04/02/2006  DATE OF DISCHARGE:  04/02/2006                                 OPERATIVE REPORT   PREOPERATIVE DIAGNOSIS:  Right knee arthrofibrosis.   POSTOPERATIVE DIAGNOSIS:  Right knee arthrofibrosis.   PROCEDURE:  Right knee closed manipulation.   SURGEON:  Ollen Gross, M.D.   ANESTHESIA:  General.   COMPLICATIONS:  None.   PREMANIPULATION:  Range of motion at 15-90.   POST MANIPULATION:  Range of motion 0-130.   CONDITION:  Stable to recovery room.   BRIEF CLINICAL NOTE:  Ms. Berquist is a 70 year old female who had a right total  knee arthroplasty done approximately 8 weeks ago.  She had very good pain  relief but unfortunately with physical therapy, has not progressed with  range of motion.  She has been stuck at 90 degrees of flexion for the past 4  weeks.  She presents now for closed manipulation.   PROCEDURE IN DETAIL:  After successful administration of general anesthetic,  exam under anesthesia performed, range of motion 15-90.  I then placed my  chest on her proximal tibia and flexed the knee.  On easily able to achieve  130 degrees of flexion.  We then mobilized the patella and was able to get  her out to full extension.  We then checked flexion against gravity and it  was a 130.  She was then awakened and transported to recovery in stable  condition.      Ollen Gross, M.D.  Electronically Signed     FA/MEDQ  D:  04/02/2006  T:  04/04/2006  Job:  [id number]

## 2010-11-23 NOTE — Op Note (Signed)
NAMEKASHAUNA, CELMER                   ACCOUNT NO.:  [id number]   MEDICAL RECORD NO.:  [id number]          PATIENT TYPE:  AMB   LOCATION:  ENDO                         FACILITY:  [facility]   PHYSICIAN:  Danise Edge, M.D.   DATE OF BIRTH:  29-Dec-1940   DATE OF PROCEDURE:  08/15/2004  DATE OF DISCHARGE:                                 OPERATIVE REPORT   PROCEDURES:  Esophagogastroduodenoscopy and colonoscopy.   PROCEDURE INDICATIONS:  Ms. Salah Burlison is a 70 year old female born Jan 13, 1941.  Ms. Headings has colonic diverticulosis and has been treated for  diverticulitis. She has type 2 diabetes mellitus and has epigastric  discomfort.   ENDOSCOPIST:  Danise Edge, M.D.   PREMEDICATION:  Versed 8 mg, Demerol 70 mg.   PROCEDURE:  Diagnostic esophagogastroduodenoscopy:  After obtaining informed  consent, Ms. Penafiel was placed in the left lateral decubitus position.  I  administered intravenous Demerol and intravenous Versed to achieve conscious  sedation for the procedure.  The patient's blood pressure, oxygen saturation  and cardiac rhythm were monitored throughout the procedure and documented  medical record.   The Olympus gastroscope was passed through the posterior hypopharynx into  the proximal esophagus without difficulty.  The hypopharynx, larynx and  vocal cords appeared normal.   Esophagoscopy:  The proximal, mid and lower segments of the esophageal  mucosa appear normal.  The squamocolumnar junction and esophagogastric junction are noted at 40 cm  from the incisor teeth.   Gastroscopy:  Retroflexed view of the gastric cardia and fundus was normal.  The gastric body, antrum and pylorus appear normal.   Duodenoscopy:  The duodenal bulb and descending duodenum appear normal.   ASSESSMENT:  Normal esophagogastroduodenoscopy.  No endoscopic evidence for  the presence of Barrett's esophagus or peptic ulcer disease.   PROCEDURE:  Diagnostic colonoscopy:  Anal inspection and  digital rectal exam  were normal.  The Olympus adjustable pediatric colonoscope was introduced  into the rectum and advanced to the cecum.  Colonic preparation for the exam  today was excellent.   Rectum normal.  Sigmoid colon and descending colon:  Left colonic diverticulosis.  Splenic flexure normal.  Transverse colon normal.  Hepatic flexure normal.  Ascending colon normal.  Cecum and ileocecal valve normal.   ASSESSMENT:  Normal proctocolonoscopy to the cecum.  Left colonic  diverticulosis present.  No endoscopic evidence for the presence of  colorectal neoplasia.      MJ/MEDQ  D:  08/15/2004  T:  08/15/2004  Job:  [id number]   cc:   Candyce Churn, M.D.  [address] [place]  [place] [address]  Fax: [phone]   Reather Littler, M.D.  [address]  [place]  [place] [address]  Fax: [phone]

## 2011-01-22 ENCOUNTER — Other Ambulatory Visit: Payer: Self-pay | Admitting: Internal Medicine

## 2011-01-22 DIAGNOSIS — Z9010 Acquired absence of unspecified breast and nipple: Secondary | ICD-10-CM

## 2011-02-07 ENCOUNTER — Ambulatory Visit
Admission: RE | Admit: 2011-02-07 | Discharge: 2011-02-07 | Disposition: A | Discharge status: Home / Self Care | Payer: Medicare Other | Source: Ambulatory Visit | Attending: Internal Medicine | Admitting: Internal Medicine

## 2011-02-07 DIAGNOSIS — Z9010 Acquired absence of unspecified breast and nipple: Secondary | ICD-10-CM

## 2011-03-27 ENCOUNTER — Other Ambulatory Visit: Payer: Self-pay | Admitting: Orthopedic Surgery

## 2011-03-27 ENCOUNTER — Encounter (HOSPITAL_COMMUNITY): Payer: Medicare Other

## 2011-03-27 LAB — COMPREHENSIVE METABOLIC PANEL
ALT: 13 U/L (ref 0–35)
AST: 15 U/L (ref 0–37)
Albumin: 3.5 g/dL (ref 3.5–5.2)
Alkaline Phosphatase: 59 U/L (ref 39–117)
BUN: 18 mg/dL (ref 6–23)
CO2: 27 mEq/L (ref 19–32)
Calcium: 9.9 mg/dL (ref 8.4–10.5)
Chloride: 104 mEq/L (ref 96–112)
Creatinine, Ser: 0.73 mg/dL (ref 0.50–1.10)
GFR calc Af Amer: >60 mL/min (ref >60)
GFR calc non Af Amer: >60 mL/min (ref >60)
Glucose, Bld: 87 mg/dL (ref 70–99)
Potassium: 3.9 mEq/L (ref 3.5–5.1)
Sodium: 141 mEq/L (ref 135–145)
Total Bilirubin: 0.5 mg/dL (ref 0.3–1.2)
Total Protein: 7.1 g/dL (ref 6.0–8.3)

## 2011-03-27 LAB — CBC
HCT: 38.6 % (ref 36.0–46.0)
Hemoglobin: 13.2 g/dL (ref 12.0–15.0)
MCH: 29.5 pg (ref 26.0–34.0)
MCHC: 34.2 g/dL (ref 30.0–36.0)
MCV: 86.4 fL (ref 78.0–100.0)
Platelets: 294 10*3/uL (ref 150–400)
RBC: 4.47 MIL/uL (ref 3.87–5.11)
RDW: 13.5 % (ref 11.5–15.5)
WBC: 8.9 10*3/uL (ref 4.0–10.5)

## 2011-03-27 LAB — URINALYSIS, ROUTINE W REFLEX MICROSCOPIC
Bilirubin Urine: NEGATIVE
Glucose, UA: NEGATIVE mg/dL
Hgb urine dipstick: NEGATIVE
Ketones, ur: NEGATIVE mg/dL
Leukocytes, UA: NEGATIVE
Nitrite: NEGATIVE
Protein, ur: NEGATIVE mg/dL
Specific Gravity, Urine: 1.014 (ref 1.005–1.030)
Urobilinogen, UA: 0.2 mg/dL (ref 0.0–1.0)
pH: 5 (ref 5.0–8.0)

## 2011-03-27 LAB — PROTIME-INR
INR: 0.95 (ref 0.00–1.49)
Prothrombin Time: 12.9 seconds (ref 11.6–15.2)

## 2011-03-27 LAB — SURGICAL PCR SCREEN
MRSA, PCR: NEGATIVE
Staphylococcus aureus: NEGATIVE

## 2011-03-27 LAB — APTT: aPTT: 29 seconds (ref 24–37)

## 2011-03-28 LAB — COMPREHENSIVE METABOLIC PANEL
ALT: 16
AST: 37
Albumin: 3 — ABNORMAL LOW
Alkaline Phosphatase: 49
BUN: 15
CO2: 25
Calcium: 8.8
Chloride: 106
Creatinine, Ser: 0.8
GFR calc Af Amer: >60
GFR calc non Af Amer: >60
Glucose, Bld: 199 — ABNORMAL HIGH
Potassium: 4.1
Sodium: 140
Total Bilirubin: 0.8
Total Protein: 5.7 — ABNORMAL LOW

## 2011-03-28 LAB — URINALYSIS, ROUTINE W REFLEX MICROSCOPIC
Bilirubin Urine: NEGATIVE
Glucose, UA: NEGATIVE
Hgb urine dipstick: NEGATIVE
Ketones, ur: NEGATIVE
Nitrite: NEGATIVE
Protein, ur: NEGATIVE
Specific Gravity, Urine: 1.025
Urobilinogen, UA: 0.2
pH: 5.5

## 2011-03-28 LAB — CBC
HCT: 34.2 — ABNORMAL LOW
Hemoglobin: 11.8 — ABNORMAL LOW
MCHC: 34.4
MCV: 88.1
Platelets: 267
RBC: 3.88
RDW: 13.9
WBC: 5.9

## 2011-03-28 LAB — DIFFERENTIAL
Basophils Absolute: 0
Basophils Relative: 1
Eosinophils Absolute: 0.1
Eosinophils Relative: 2
Lymphocytes Relative: 31
Lymphs Abs: 1.8
Monocytes Absolute: 0.5
Monocytes Relative: 9
Neutro Abs: 3.4
Neutrophils Relative %: 58

## 2011-04-01 LAB — URINALYSIS, ROUTINE W REFLEX MICROSCOPIC
Bilirubin Urine: NEGATIVE
Glucose, UA: NEGATIVE
Hgb urine dipstick: NEGATIVE
Ketones, ur: NEGATIVE
Nitrite: NEGATIVE
Protein, ur: NEGATIVE
Specific Gravity, Urine: 1.024
Urobilinogen, UA: 0.2
pH: 5.5

## 2011-04-01 LAB — CBC
HCT: 34.8 — ABNORMAL LOW
Hemoglobin: 11.6 — ABNORMAL LOW
MCHC: 33.4
MCV: 88
Platelets: 301
RBC: 3.95
RDW: 14.3
WBC: 6.4

## 2011-04-01 LAB — BASIC METABOLIC PANEL
BUN: 15
CO2: 25
Calcium: 9.2
Chloride: 104
Creatinine, Ser: 0.79
GFR calc Af Amer: >60
GFR calc non Af Amer: >60
Glucose, Bld: 212 — ABNORMAL HIGH
Potassium: 4.4
Sodium: 138

## 2011-04-01 LAB — DIFFERENTIAL
Basophils Absolute: 0.1
Basophils Relative: 1
Eosinophils Absolute: 0.1
Eosinophils Relative: 2
Lymphocytes Relative: 31
Lymphs Abs: 2
Monocytes Absolute: 0.6
Monocytes Relative: 9
Neutro Abs: 3.7
Neutrophils Relative %: 58

## 2011-04-03 ENCOUNTER — Ambulatory Visit (HOSPITAL_COMMUNITY)
Admission: RE | Admit: 2011-04-03 | Discharge: 2011-04-04 | Disposition: A | Discharge status: Home Health | Payer: Medicare Other | Source: Ambulatory Visit | Attending: Orthopedic Surgery | Admitting: Orthopedic Surgery

## 2011-04-03 DIAGNOSIS — Z79899 Other long term (current) drug therapy: Secondary | ICD-10-CM | POA: Insufficient documentation

## 2011-04-03 DIAGNOSIS — Z01812 Encounter for preprocedural laboratory examination: Secondary | ICD-10-CM | POA: Insufficient documentation

## 2011-04-03 DIAGNOSIS — E119 Type 2 diabetes mellitus without complications: Secondary | ICD-10-CM | POA: Insufficient documentation

## 2011-04-03 DIAGNOSIS — M2469 Ankylosis, other specified joint: Secondary | ICD-10-CM | POA: Insufficient documentation

## 2011-04-03 DIAGNOSIS — Y831 Surgical operation with implant of artificial internal device as the cause of abnormal reaction of the patient, or of later complication, without mention of misadventure at the time of the procedure: Secondary | ICD-10-CM | POA: Insufficient documentation

## 2011-04-03 DIAGNOSIS — M24669 Ankylosis, unspecified knee: Secondary | ICD-10-CM | POA: Insufficient documentation

## 2011-04-03 DIAGNOSIS — Z96659 Presence of unspecified artificial knee joint: Secondary | ICD-10-CM | POA: Insufficient documentation

## 2011-04-03 DIAGNOSIS — I10 Essential (primary) hypertension: Secondary | ICD-10-CM | POA: Insufficient documentation

## 2011-04-03 DIAGNOSIS — Z01810 Encounter for preprocedural cardiovascular examination: Secondary | ICD-10-CM | POA: Insufficient documentation

## 2011-04-03 DIAGNOSIS — T8489XA Other specified complication of internal orthopedic prosthetic devices, implants and grafts, initial encounter: Secondary | ICD-10-CM | POA: Insufficient documentation

## 2011-04-03 DIAGNOSIS — M259 Joint disorder, unspecified: Secondary | ICD-10-CM | POA: Insufficient documentation

## 2011-04-03 HISTORY — PX: KNEE ARTHROTOMY: SUR107

## 2011-04-03 LAB — GLUCOSE, CAPILLARY
Glucose-Capillary: 115 mg/dL — ABNORMAL HIGH (ref 70–99)
Glucose-Capillary: 167 mg/dL — ABNORMAL HIGH (ref 70–99)
Glucose-Capillary: 182 mg/dL — ABNORMAL HIGH (ref 70–99)

## 2011-04-03 NOTE — Op Note (Signed)
NAMEKLARA, [name]                   ACCOUNT NO.:  [id number]  MEDICAL RECORD NO.:  [id number]  LOCATION:  DAYL                         FACILITY:  [facility]  PHYSICIAN:  Ollen Gross, M.D.    DATE OF BIRTH:  09-22-1940  DATE OF PROCEDURE:  04/03/2011 DATE OF DISCHARGE:                              OPERATIVE REPORT   PREOPERATIVE DIAGNOSIS:  Right knee arthrofibrosis.  POSTOPERATIVE DIAGNOSIS:  Right knee arthrofibrosis.  PROCEDURE:  Right knee arthrotomy, scar excision, polyethylene exchange.  SURGEON:  Ollen Gross, M.D.  ASSISTANT:  Alexzandrew L. Perkins, P.A.C.  ANESTHESIA:  Spinal.  ESTIMATED BLOOD LOSS:  Minimal.  DRAINS:  Hemovac x1.  TOURNIQUET TIME:  16 minutes at 300 mmHg.  COMPLICATIONS:  None.  CONDITION:  Stable to Recovery.  CLINICAL NOTE:  [name] is a 70 year old female, had a right total knee arthroplasty done several years ago and did fine for first few years and then has progressively developed scarring and decreased range of motion. Subsequently, she has had pain.  Workup has been negative for infection or loosening.  She presents now for arthrotomy and scar excision with revision of the tibial polyethylene to remove the posterior scar.  She has had nonoperative management including physical therapy which has not helped with the range of motion.  PROCEDURE IN DETAIL:  After successful administration of spinal anesthetic, a tourniquet was placed on her right thigh and her right lower extremity, prepped and draped in usual sterile fashion. Extremities wrapped in Esmarch and tourniquet inflated to 300 mmHg. Exam under anesthesia shows range of motion 5-80 degrees.  A midline incision was made with a 10 blade through subcutaneous tissue to the level of the extensor mechanism.  A fresh blade was used to make a medial parapatellar arthrotomy.  We did not encounter any fluid in the joint.  The tissues did not show signs of inflammation.  There was  thick exuberant scar underneath the extensor mechanism, and this was removed with a fresh blade.  Soft tissue on the proximal medial tibia was then subperiosteally elevated to joint line with the knife into the semimembranosus bursa with a Cobb elevator.  I was then able to flex the knee and evert the patella and then subluxed the tibia forward and removed the tibial polyethylene.  It was size 3, 10 mm thickness.  We removed the scar posteriorly carefully retracting down to avoid any of the neurovascular structures.  The surgical assistant provided this care for retraction.  The scar was excised and then a new 10-mm thick size 3 rotating platform posterior stabilized tibial polyethylene was placed. The knee was reduced.  I then everted the patella again and removed the scar from around the patella and in both the medial and lateral gutters. We were then able to flex against gravity and 125 degrees of flexion was achieved.  Full extension was also achieved.  The wound was then copiously irrigated with saline solution and the arthrotomy closed over Hemovac drain with interrupted #1 PDS.  Flexion against gravity again in the 125 degrees.  Tourniquet was released total time of 16 minutes. Subcu was closed with interrupted 2-0 Vicryl and subcuticular running  4- 0 Monocryl.  The drains hooked to suction.  Incision cleaned and dried, and Steri-Strips and bulky sterile dressing were applied.  She then placed into a knee immobilizer, awakened and transported to recovery in stable condition.  Please note that the use of the surgical assistant was a medical necessity to ensure the safe and expeditious performance of this procedure.  Surgical assistant was necessary to retract the ligaments and vital neurovascular structures to prevent injury to them while separating the scar from them and removing the scar.     Ollen Gross, M.D.     FA/MEDQ  D:  04/03/2011  T:  04/03/2011  Job:   [id number]  Electronically Signed by Ollen Gross M.D. on 04/03/2011 04:54:09 PM

## 2011-04-04 LAB — GLUCOSE, CAPILLARY
Glucose-Capillary: 167 mg/dL — ABNORMAL HIGH (ref 70–99)
Glucose-Capillary: 182 mg/dL — ABNORMAL HIGH (ref 70–99)
Glucose-Capillary: 189 mg/dL — ABNORMAL HIGH (ref 70–99)

## 2011-04-05 LAB — GLUCOSE, CAPILLARY: Glucose-Capillary: 127 mg/dL — ABNORMAL HIGH (ref 70–99)

## 2011-05-15 ENCOUNTER — Other Ambulatory Visit: Payer: Self-pay | Admitting: Oncology

## 2011-05-15 ENCOUNTER — Other Ambulatory Visit: Payer: Medicare Other | Admitting: Lab

## 2011-05-15 DIAGNOSIS — C50219 Malignant neoplasm of upper-inner quadrant of unspecified female breast: Secondary | ICD-10-CM

## 2011-05-15 LAB — COMPREHENSIVE METABOLIC PANEL
ALT: 10 U/L (ref 0–35)
AST: 12 U/L (ref 0–37)
Albumin: 4.1 g/dL (ref 3.5–5.2)
Alkaline Phosphatase: 50 U/L (ref 39–117)
BUN: 17 mg/dL (ref 6–23)
CO2: 26 mEq/L (ref 19–32)
Calcium: 9.6 mg/dL (ref 8.4–10.5)
Chloride: 103 mEq/L (ref 96–112)
Creatinine, Ser: 0.91 mg/dL (ref 0.50–1.10)
Glucose, Bld: 215 mg/dL — ABNORMAL HIGH (ref 70–99)
Potassium: 4.5 mEq/L (ref 3.5–5.3)
Sodium: 139 mEq/L (ref 135–145)
Total Bilirubin: 0.5 mg/dL (ref 0.3–1.2)
Total Protein: 7 g/dL (ref 6.0–8.3)

## 2011-05-15 LAB — CBC WITH DIFFERENTIAL/PLATELET
BASO%: 0.2 % (ref 0.0–2.0)
Basophils Absolute: 0 10*3/uL (ref 0.0–0.1)
EOS%: 0.8 % (ref 0.0–7.0)
Eosinophils Absolute: 0.1 10*3/uL (ref 0.0–0.5)
HCT: 37.3 % (ref 34.8–46.6)
HGB: 12.5 g/dL (ref 11.6–15.9)
LYMPH%: 24.5 % (ref 14.0–49.7)
MCH: 29.4 pg (ref 25.1–34.0)
MCHC: 33.5 g/dL (ref 31.5–36.0)
MCV: 87.7 fL (ref 79.5–101.0)
MONO#: 0.6 10*3/uL (ref 0.1–0.9)
MONO%: 7.3 % (ref 0.0–14.0)
NEUT#: 5.6 10*3/uL (ref 1.5–6.5)
NEUT%: 67.2 % (ref 38.4–76.8)
Platelets: 310 10*3/uL (ref 145–400)
RBC: 4.25 10*6/uL (ref 3.70–5.45)
RDW: 14.1 % (ref 11.2–14.5)
WBC: 8.4 10*3/uL (ref 3.9–10.3)
lymph#: 2.1 10*3/uL (ref 0.9–3.3)

## 2011-05-22 ENCOUNTER — Ambulatory Visit (HOSPITAL_BASED_OUTPATIENT_CLINIC_OR_DEPARTMENT_OTHER): Payer: Medicare Other | Admitting: Oncology

## 2011-05-22 ENCOUNTER — Telehealth: Payer: Self-pay | Admitting: Oncology

## 2011-05-22 VITALS — BP 121/76 | HR 81 | Temp 98.2°F | Ht 67.0 in | Wt 181.6 lb

## 2011-05-22 DIAGNOSIS — H9319 Tinnitus, unspecified ear: Secondary | ICD-10-CM

## 2011-05-22 DIAGNOSIS — E119 Type 2 diabetes mellitus without complications: Secondary | ICD-10-CM

## 2011-05-22 DIAGNOSIS — C50919 Malignant neoplasm of unspecified site of unspecified female breast: Secondary | ICD-10-CM

## 2011-05-22 DIAGNOSIS — C50219 Malignant neoplasm of upper-inner quadrant of unspecified female breast: Secondary | ICD-10-CM

## 2011-05-22 NOTE — Progress Notes (Signed)
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 The anal history significant for having had a right total knee replacement under Dr Lequita Halt. She is doing very well with rehabilitation and currently having very little pain. The other item of that is new is separation between her son and his wife. His her son is currently living with her. There is a 72-year-old grandson that she is spending a lot of time with her  ROS:  Otherwise detailed review of systems was noncontributory she needs new glasses she has tinnitus her dentures don't fit perfectly and of course she is continues to struggle with her diabetes although she tells me she is losing a little bit of weight and her most recent hemoglobin A1c was favorable. Certainly there have been no unusual headaches visual changes cough phlegm production pleurisy shortness of breath or change in bowel or bladder habits, no rash no bleeding no fever and no systemic symptoms  Medications: I have reviewed the patient's current medications.   No current outpatient prescriptions on file.     Objective: Vital signs in last 24 hours: BP 121/76  Pulse 81  Temp 98.2 F (36.8 C)  Ht 5\' 7"  (1.702 m)  Wt 181 lb 9.6 oz (82.373 kg)  BMI 28.44 kg/m2   Physical Exam:    Sclerae unicteric  Oropharynx clear  No peripheral adenopathy  Lungs clear -- no rales or rhonchi  Heart regular rate and rhythm  Abdomen benign  MSK no focal spinal tenderness, no peripheral edema  Neuro nonfocal  Breast exam: Right breast no suspicious finding left breast status post mastectomy with implant reconstruction no evidence of local recurrence  Lab Results:   BMET    Component Value Date/Time   NA 139 05/15/2011 1325   K 4.5 05/15/2011 1325   CL 103 05/15/2011 1325   CO2 26 05/15/2011 1325   GLUCOSE 215* 05/15/2011 1325   BUN 17 05/15/2011 1325   CREATININE 0.91 05/15/2011 1325   CALCIUM 9.6 05/15/2011 1325   GFRNONAA >60 03/27/2011  1110   GFRAA >60 03/27/2011 1110     CMP     Component Value Date/Time   NA 139 05/15/2011 1325   K 4.5 05/15/2011 1325   CL 103 05/15/2011 1325   CO2 26 05/15/2011 1325   GLUCOSE 215* 05/15/2011 1325   BUN 17 05/15/2011 1325   CREATININE 0.91 05/15/2011 1325   CALCIUM 9.6 05/15/2011 1325   PROT 7.0 05/15/2011 1325   ALBUMIN 4.1 05/15/2011 1325   AST 12 05/15/2011 1325   ALT 10 05/15/2011 1325   ALKPHOS 50 05/15/2011 1325   BILITOT 0.5 05/15/2011 1325   GFRNONAA >60 03/27/2011 1110   GFRAA >60 03/27/2011 1110    CBC    Component Value Date/Time   WBC 8.9 03/27/2011 1110   RBC 4.47 03/27/2011 1110   HGB 12.5 05/15/2011 1325   HGB 13.2 03/27/2011 1110   HCT 37.3 05/15/2011 1325   HCT 38.6 03/27/2011 1110   PLT 310 05/15/2011 1325   PLT 294 03/27/2011 1110   MCV 87.7 05/15/2011 1325   MCV 86.4 03/27/2011 1110   MCH 29.5 03/27/2011 1110   MCHC 34.2 03/27/2011 1110   RDW 13.5 03/27/2011 1110   LYMPHSABS 2.0 09/29/2007 1015   MONOABS 0.6 09/29/2007 1015   EOSABS 0.1 05/15/2011 1325   EOSABS 0.1 09/29/2007 1015   BASOSABS 0.0 05/15/2011 1325   BASOSABS 0.1 09/29/2007 1015  Studies/Results: She had a chest x-ray in November of last year which was unremarkable and a right mammogram this August which showed no suspicious finding   Assessment:  70 year old [place] woman status post left lumpectomy January of 2009 for a 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 I will see her then one more time in March of 2014, a little bit beyond her 5 year date. At this point I am delighted that there is no evidence of disease recurrence  [name]  C 05/22/2011

## 2011-05-22 NOTE — Telephone Encounter (Signed)
gv pt appt for aug2013.  Pt will have chest xray done @ [facility] as a walk-in

## 2011-06-14 ENCOUNTER — Ambulatory Visit (INDEPENDENT_AMBULATORY_CARE_PROVIDER_SITE_OTHER): Payer: Medicare Other | Admitting: General Surgery

## 2011-06-14 ENCOUNTER — Encounter (INDEPENDENT_AMBULATORY_CARE_PROVIDER_SITE_OTHER): Payer: Self-pay | Admitting: General Surgery

## 2011-06-14 ENCOUNTER — Other Ambulatory Visit (INDEPENDENT_AMBULATORY_CARE_PROVIDER_SITE_OTHER): Payer: Self-pay

## 2011-06-14 DIAGNOSIS — L989 Disorder of the skin and subcutaneous tissue, unspecified: Secondary | ICD-10-CM

## 2011-06-14 DIAGNOSIS — L820 Inflamed seborrheic keratosis: Secondary | ICD-10-CM

## 2011-06-14 DIAGNOSIS — C50919 Malignant neoplasm of unspecified site of unspecified female breast: Secondary | ICD-10-CM | POA: Insufficient documentation

## 2011-06-14 NOTE — Progress Notes (Signed)
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 She is over 2-1/2 years post surgical treatment. There was no adjuvant treatment. She reports she is feeling well. She's had no rest lumps or pain or changes in her reconstructed breast.  She does have an irritated skin lesion up near the left axilla which has been present for some time.  Exam: Gen.: Well-developed, appears well Skin: On the lateral aspect of her reconstructed breast near the axilla is a 1 cm x 0.5 cm raised slightly irritated pigmented skin lesion. Breasts: No masses in either breast. Reconstruction on the left without chest wall masses. Lymph nodes: No cervical, supraclavicular, or axillary nodes palpable Lungs: Clear without wheezing or increased work of breathing Abdomen: Soft and nontender without masses or organomegaly  Mammogram in August was negative.  Assessment and plan: Doing well following left mastectomy for sarcoma with no clinical evidence of recurrence at 2-1/2 years  Skin lesion left chest wall. This was excised under local anesthesia and sent for pathology. All color with results.  2 return in 6 months for followup.

## 2011-06-18 ENCOUNTER — Telehealth (INDEPENDENT_AMBULATORY_CARE_PROVIDER_SITE_OTHER): Payer: Self-pay

## 2011-06-18 NOTE — Telephone Encounter (Signed)
Called patient with her pathology results, patient aware 06/18/11

## 2011-06-18 NOTE — Progress Notes (Signed)
Patient aware of pathology results 06/18/11/cm

## 2011-07-18 ENCOUNTER — Other Ambulatory Visit: Payer: Self-pay | Admitting: Orthopedic Surgery

## 2011-07-26 ENCOUNTER — Encounter (HOSPITAL_BASED_OUTPATIENT_CLINIC_OR_DEPARTMENT_OTHER): Payer: Self-pay | Admitting: *Deleted

## 2011-07-26 NOTE — Progress Notes (Signed)
NPO AFTER MN. ARRIVES AT 0945. NEEDS ISTAT. CURRENT EKG W/ CHART , 03-27-2011.

## 2011-07-30 ENCOUNTER — Ambulatory Visit (HOSPITAL_COMMUNITY)
Admission: RE | Admit: 2011-07-30 | Discharge: 2011-07-30 | Disposition: A | Discharge status: Home / Self Care | Payer: Medicare Other | Source: Ambulatory Visit | Attending: Oncology | Admitting: Oncology

## 2011-07-30 DIAGNOSIS — C50919 Malignant neoplasm of unspecified site of unspecified female breast: Secondary | ICD-10-CM

## 2011-07-31 ENCOUNTER — Encounter (HOSPITAL_BASED_OUTPATIENT_CLINIC_OR_DEPARTMENT_OTHER): Payer: Self-pay | Admitting: Anesthesiology

## 2011-07-31 ENCOUNTER — Ambulatory Visit (HOSPITAL_BASED_OUTPATIENT_CLINIC_OR_DEPARTMENT_OTHER): Payer: Medicare Other | Admitting: Anesthesiology

## 2011-07-31 ENCOUNTER — Ambulatory Visit (HOSPITAL_BASED_OUTPATIENT_CLINIC_OR_DEPARTMENT_OTHER)
Admission: RE | Admit: 2011-07-31 | Discharge: 2011-07-31 | Disposition: A | Discharge status: Home / Self Care | Payer: Medicare Other | Source: Ambulatory Visit | Attending: Orthopedic Surgery | Admitting: Orthopedic Surgery

## 2011-07-31 ENCOUNTER — Encounter (HOSPITAL_BASED_OUTPATIENT_CLINIC_OR_DEPARTMENT_OTHER): Payer: Self-pay | Admitting: *Deleted

## 2011-07-31 ENCOUNTER — Encounter (HOSPITAL_BASED_OUTPATIENT_CLINIC_OR_DEPARTMENT_OTHER)
Admission: RE | Disposition: A | Discharge status: Home / Self Care | Payer: Self-pay | Source: Ambulatory Visit | Attending: Orthopedic Surgery

## 2011-07-31 DIAGNOSIS — Z853 Personal history of malignant neoplasm of breast: Secondary | ICD-10-CM | POA: Insufficient documentation

## 2011-07-31 DIAGNOSIS — M25561 Pain in right knee: Secondary | ICD-10-CM

## 2011-07-31 DIAGNOSIS — Z01818 Encounter for other preprocedural examination: Secondary | ICD-10-CM | POA: Insufficient documentation

## 2011-07-31 DIAGNOSIS — Z79899 Other long term (current) drug therapy: Secondary | ICD-10-CM | POA: Insufficient documentation

## 2011-07-31 DIAGNOSIS — E119 Type 2 diabetes mellitus without complications: Secondary | ICD-10-CM | POA: Insufficient documentation

## 2011-07-31 DIAGNOSIS — E785 Hyperlipidemia, unspecified: Secondary | ICD-10-CM | POA: Insufficient documentation

## 2011-07-31 DIAGNOSIS — E538 Deficiency of other specified B group vitamins: Secondary | ICD-10-CM | POA: Insufficient documentation

## 2011-07-31 DIAGNOSIS — M2469 Ankylosis, other specified joint: Secondary | ICD-10-CM | POA: Insufficient documentation

## 2011-07-31 DIAGNOSIS — Z7982 Long term (current) use of aspirin: Secondary | ICD-10-CM | POA: Insufficient documentation

## 2011-07-31 DIAGNOSIS — M24669 Ankylosis, unspecified knee: Secondary | ICD-10-CM | POA: Insufficient documentation

## 2011-07-31 DIAGNOSIS — I10 Essential (primary) hypertension: Secondary | ICD-10-CM | POA: Insufficient documentation

## 2011-07-31 HISTORY — DX: Other specified postprocedural states: R11.2

## 2011-07-31 HISTORY — DX: Essential (primary) hypertension: I10

## 2011-07-31 HISTORY — DX: Type 2 diabetes mellitus with diabetic neuropathy, unspecified: E11.40

## 2011-07-31 HISTORY — DX: Ankylosis, unspecified knee: M24.669

## 2011-07-31 HISTORY — DX: Other specified postprocedural states: Z98.890

## 2011-07-31 HISTORY — DX: Vitamin B12 deficiency anemia, unspecified: D51.9

## 2011-07-31 HISTORY — DX: Palpitations: R00.2

## 2011-07-31 HISTORY — PX: KNEE CLOSED REDUCTION: SHX995

## 2011-07-31 LAB — POCT I-STAT 4, (NA,K, GLUC, HGB,HCT)
Glucose, Bld: 164 mg/dL — ABNORMAL HIGH (ref 70–99)
HCT: 38 % (ref 36.0–46.0)
Hemoglobin: 12.9 g/dL (ref 12.0–15.0)
Potassium: 4.4 mEq/L (ref 3.5–5.1)
Sodium: 141 mEq/L (ref 135–145)

## 2011-07-31 LAB — GLUCOSE, CAPILLARY: Glucose-Capillary: 124 mg/dL — ABNORMAL HIGH (ref 70–99)

## 2011-07-31 SURGERY — MANIPULATION, KNEE, CLOSED
Anesthesia: General | Site: Knee | Laterality: Right | Wound class: Clean

## 2011-07-31 MED ORDER — FENTANYL CITRATE 0.05 MG/ML IJ SOLN
INTRAMUSCULAR | Status: DC | PRN
  Administered 2011-07-31: 100 ug via INTRAVENOUS

## 2011-07-31 MED ORDER — LACTATED RINGERS IV SOLN: INTRAVENOUS | Status: DC

## 2011-07-31 MED ORDER — PROMETHAZINE HCL 25 MG/ML IJ SOLN: 6.2500 mg | INTRAMUSCULAR | Status: DC | PRN

## 2011-07-31 MED ORDER — CHLORHEXIDINE GLUCONATE 4 % EX LIQD: 60.0000 mL | Freq: Once | CUTANEOUS | Status: DC

## 2011-07-31 MED ORDER — LACTATED RINGERS IV SOLN
INTRAVENOUS | Status: DC
  Administered 2011-07-31: 100 mL/h via INTRAVENOUS

## 2011-07-31 MED ORDER — FENTANYL CITRATE 0.05 MG/ML IJ SOLN
25.0000 ug | INTRAMUSCULAR | Status: DC | PRN
  Administered 2011-07-31 (×2): 25 ug via INTRAVENOUS

## 2011-07-31 MED ORDER — PROPOFOL 10 MG/ML IV EMUL
INTRAVENOUS | Status: DC | PRN
  Administered 2011-07-31: 200 mg via INTRAVENOUS

## 2011-07-31 MED ORDER — HYDROCODONE-ACETAMINOPHEN 5-325 MG PO TABS: 1.0000 | ORAL_TABLET | Freq: Four times a day (QID) | ORAL | Status: AC | PRN

## 2011-07-31 MED ORDER — LIDOCAINE HCL (CARDIAC) 20 MG/ML IV SOLN
INTRAVENOUS | Status: DC | PRN
  Administered 2011-07-31: 60 mg via INTRAVENOUS

## 2011-07-31 MED ORDER — METHOCARBAMOL 500 MG PO TABS: 500.0000 mg | ORAL_TABLET | Freq: Four times a day (QID) | ORAL | Status: AC

## 2011-07-31 MED ORDER — SODIUM CHLORIDE 0.9 % IV SOLN: INTRAVENOUS | Status: DC

## 2011-07-31 MED ORDER — HYDROCODONE-ACETAMINOPHEN 5-325 MG PO TABS
1.0000 | ORAL_TABLET | Freq: Four times a day (QID) | ORAL | Status: DC | PRN
  Administered 2011-07-31: 1 via ORAL

## 2011-07-31 MED ORDER — KETOROLAC TROMETHAMINE 30 MG/ML IJ SOLN
INTRAMUSCULAR | Status: DC | PRN
  Administered 2011-07-31: 30 mg via INTRAVENOUS

## 2011-07-31 MED ORDER — ONDANSETRON HCL 4 MG/2ML IJ SOLN
INTRAMUSCULAR | Status: DC | PRN
  Administered 2011-07-31: 4 mg via INTRAVENOUS

## 2011-07-31 NOTE — Anesthesia Procedure Notes (Signed)
Date/Time: 07/31/2011 11:40 AM Performed by: Huel Coventry Pre-anesthesia Checklist: Patient identified, Emergency Drugs available, Suction available, Patient being monitored and Timeout performed Patient Re-evaluated:Patient Re-evaluated prior to inductionOxygen Delivery Method: Simple face mask and Circle System Utilized Preoxygenation: Pre-oxygenation with 100% oxygen Intubation Type: IV induction Ventilation: Mask ventilation without difficulty, Mask ventilation throughout procedure and Oral airway inserted - appropriate to patient size Placement Confirmation: positive ETCO2

## 2011-07-31 NOTE — H&P (Signed)
CC- [name] is a 71 y.o. female who presents with right knee stiffness.  HPI- . Knee Pain: Patient presents with stiffness involving the  right knee. Onset of the symptoms was several months ago. Inciting event: none known. Current symptoms include stiffness. Pain is aggravated by rising after sitting.  Patient has had prior knee problems. Evaluation to date: PT evaluation attempted to increase ROM but failed. Treatment to date: PT which was not very effective.  Past Medical History  Diagnosis Date  . Hyperlipidemia   . 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 Diabetes mellitus ORAL AND INSULIN MEDS  . Heart palpitations SINCE 2008 TAKES BETA BLOCKER  . PONV (postoperative nausea and vomiting)   . B12 deficiency anemia   . Hypertension   . Neuropathy, diabetic RIGHT FOOT MILD NUMBNESS  . Fibrosis of knee joint RIGHT    Past Surgical History  Procedure Date  . Knee arthrotomy 04-03-2011    RIGHT KNEE/ SCAR EXCISION/ POLYTHYLENE EXCHANGE  . Exchange left breast implant/ capsulotomy/ right mastopexy 11-28-2008  . Total left mastectomy 09-29-2007  . Breast lumpectomy 07-27-2007    LEFT   . Manipulation knee joint 04-02-2006    CLOSED --  RIGHT   . Total knee arthroplasty 02-03-2006    RIGHT  . Cardiac catheterization 2001    MIMINAL PLAQUE RCA, CIRCUMFLEX, LAD;   NORMAL LVF  . Knee arthroscopy 1999    RIGHT  . Total abdominal hysterectomy w/ bilateral salpingoophorectomy 1989    AND APPENDECTOMY  . Left breast bx ;  x2 1976  . Tubal ligation 1976  . Urethral dilation 1976  . Cervical fusion 1996    C3 - 5    Prior to Admission medications   Medication Sig Start Date End Date Taking? Authorizing Provider  aspirin 81 MG tablet Take 81 mg by mouth daily.    Yes Historical Provider, MD  cholecalciferol (VITAMIN D) 400 UNITS TABS Take 2,000 Units by mouth daily.    Yes  Historical Provider, MD  Cyanocobalamin (VITAMIN B-12 IJ) Inject as directed every 30 (thirty) days.   Yes Historical Provider, MD  estradiol (ESTRACE) 0.5 MG tablet Take 0.5 mg by mouth daily.    Yes Historical Provider, MD  furosemide (LASIX) 20 MG tablet Take 20 mg by mouth daily.    Yes Historical Provider, MD  insulin glargine (LANTUS) 100 UNIT/ML injection Inject 65 Units into the skin every morning.    Yes Historical Provider, MD  metFORMIN (GLUCOPHAGE-XR) 500 MG 24 hr tablet Take 500 mg by mouth 2 (two) times daily.    Yes Historical Provider, MD  metoprolol (LOPRESSOR) 50 MG tablet Take 50 mg by mouth every evening.    Yes Historical Provider, MD  rosuvastatin (CRESTOR) 10 MG tablet Take 10 mg by mouth every evening.    Yes Historical Provider, MD  sitaGLIPtin (JANUVIA) 100 MG tablet Take 100 mg by mouth daily.    Yes Historical Provider, MD   Knee Exam Range of motion 5-85. No warmth, swelling or tenderness.  Physical Examination: General appearance - alert, well appearing, and in no distress Mental status - alert, oriented to person, place, and time Chest - clear to auscultation, no wheezes, rales or rhonchi, symmetric air entry Heart - normal rate, regular rhythm, normal S1, S2, no murmurs, rubs, clicks or gallops Abdomen - soft, nontender, nondistended,  no masses or organomegaly Neurological - alert, oriented, normal speech, no focal findings or movement disorder noted   Asessment/Plan--- Right knee arthrofibrosis- - Plan right knee closed manipulation. Procedure risks and potential comps discussed with patient who elects to proceed. Goals are decreased pain and increased function with a high likelihood of achieving both

## 2011-07-31 NOTE — Interval H&P Note (Signed)
History and Physical Interval Note:  07/31/2011 11:30 AM  [name]  has presented today for surgery, with the diagnosis of ARTHOFIBROSIS OF THE RIGHT KNEE  The various methods of treatment have been discussed with the patient and family. After consideration of risks, benefits and other options for treatment, the patient has consented to  Procedure(s): CLOSED MANIPULATION KNEE as a surgical intervention .  The patients' history has been reviewed, patient examined, no change in status, stable for surgery.  I have reviewed the patients' chart and labs.  Questions were answered to the patient's satisfaction.     Loanne Drilling

## 2011-07-31 NOTE — Brief Op Note (Signed)
07/31/2011  11:45 AM  PATIENT:  Rodolph Bong  71 y.o. female  PRE-OPERATIVE DIAGNOSIS:  ARTHOFIBROSIS OF THE RIGHT KNEE  POST-OPERATIVE DIAGNOSIS:  ARTHROFIBROSIS OF THE RIGHT KNEE  PROCEDURE:  Procedure(s): CLOSED MANIPULATION KNEE-Right  SURGEON:  Surgeon(s): Loanne Drilling, MD  PHYSICIAN ASSISTANT:   ASSISTANTS: none   ANESTHESIA:   general  Pre-manipulation ROM-  10-90 Post-manipulation ROM-  5-125  DICTATION: .Other Dictation: Dictation Number [id number]  PLAN OF CARE: Discharge to home after PACU  PATIENT DISPOSITION:  PACU - hemodynamically stable.   Gus Rankin Ethin Drummond, MD    07/31/2011, 11:46 AM

## 2011-07-31 NOTE — Transfer of Care (Signed)
Immediate Anesthesia Transfer of Care Note  Patient: [name]  Procedure(s) Performed:  CLOSED MANIPULATION KNEE  Patient Location: PACU  Anesthesia Type: General  Level of Consciousness: awake, sedated, patient cooperative and responds to stimulation  Airway & Oxygen Therapy: Patient Spontanous Breathing and Patient connected to face mask oxygen  Post-op Assessment: Report given to PACU RN, Post -op Vital signs reviewed and stable and Patient moving all extremities  Post vital signs: Reviewed and stable  Complications: No apparent anesthesia complications

## 2011-07-31 NOTE — Anesthesia Preprocedure Evaluation (Signed)
Anesthesia Evaluation  Patient identified by MRN, date of birth, ID band Patient awake    Reviewed: Allergy & Precautions, H&P , NPO status , Patient's Chart, lab work & pertinent test results, reviewed documented beta blocker date and time   History of Anesthesia Complications (+) PONV  Airway Mallampati: II TM Distance: >3 FB     Dental  (+) Caps and Dental Advisory Given,    Pulmonary neg pulmonary ROS,  clear to auscultation  Pulmonary exam normal       Cardiovascular hypertension, Pt. on medications Regular Normal    Neuro/Psych Negative Neurological ROS  Negative Psych ROS   GI/Hepatic negative GI ROS, Neg liver ROS,   Endo/Other  Diabetes mellitus-, Well Controlled, Type 2, Insulin Dependent and Oral Hypoglycemic Agents  Renal/GU negative Renal ROS  Genitourinary negative   Musculoskeletal negative musculoskeletal ROS (+)   Abdominal   Peds negative pediatric ROS (+)  Hematology negative hematology ROS (+)   Anesthesia Other Findings   Reproductive/Obstetrics negative OB ROS                           Anesthesia Physical Anesthesia Plan  ASA: III  Anesthesia Plan: General   Post-op Pain Management:    Induction: Intravenous  Airway Management Planned: LMA  Additional Equipment:   Intra-op Plan:   Post-operative Plan:   Informed Consent: I have reviewed the patients History and Physical, chart, labs and discussed the procedure including the risks, benefits and alternatives for the proposed anesthesia with the patient or authorized representative who has indicated his/her understanding and acceptance.   Dental advisory given  Plan Discussed with: CRNA  Anesthesia Plan Comments:         Anesthesia Quick Evaluation

## 2011-08-01 ENCOUNTER — Encounter (HOSPITAL_BASED_OUTPATIENT_CLINIC_OR_DEPARTMENT_OTHER): Payer: Self-pay | Admitting: Orthopedic Surgery

## 2011-08-01 NOTE — Anesthesia Postprocedure Evaluation (Signed)
Anesthesia Post Note  Patient: [name]  Procedure(s) Performed:  CLOSED MANIPULATION KNEE  Anesthesia type: General  Patient location: PACU  Post pain: Pain level controlled  Post assessment: Post-op Vital signs reviewed  Last Vitals:  Filed Vitals:   07/31/11 1305  BP: 117/65  Pulse: 62  Temp: 36.1 C  Resp: 14    Post vital signs: Reviewed  Level of consciousness: sedated  Complications: No apparent anesthesia complications

## 2011-08-01 NOTE — Op Note (Signed)
[name], [name]                   ACCOUNT NO.:  [id number]  MEDICAL RECORD NO.:  [id number]  LOCATION:                               FACILITY:  [facility]  PHYSICIAN:  Ollen Gross, M.D.    DATE OF BIRTH:  03-28-1941  DATE OF PROCEDURE:  07/31/2011 DATE OF DISCHARGE:                              OPERATIVE REPORT   PREOPERATIVE DIAGNOSIS:  Arthrofibrosis, right knee.  POSTOPERATIVE DIAGNOSIS:  Arthrofibrosis, right knee.  PROCEDURE:  Right knee closed manipulation.  SURGEON:  Ollen Gross, MD  ASSISTANT:  None.  ANESTHESIA:  General.  COMPLICATIONS:  None.  CONDITION:  Stable to Recovery.  Pre-manipulation range of motion is 10-90.  Post-manipulation range of motion 5-125.  PROCEDURE IN DETAIL:  After successful administration of general anesthetic, exam under anesthesia was performed showing range of motion 10-90 degrees.  I then placed my chest against her proximal tibia, flexing the knee with audible lysis of adhesions.  I was easily able to get her to 125 degrees.  I then put her back in extension and with some patellar manipulation and gentle pressure got within 5 degrees of full extension.  She was subsequently awakened and transported to Recovery in stable condition.     Ollen Gross, M.D.     FA/MEDQ  D:  07/31/2011  T:  07/31/2011  Job:  [id number]

## 2012-01-06 ENCOUNTER — Ambulatory Visit (INDEPENDENT_AMBULATORY_CARE_PROVIDER_SITE_OTHER): Payer: Medicare Other | Admitting: General Surgery

## 2012-01-27 ENCOUNTER — Ambulatory Visit (INDEPENDENT_AMBULATORY_CARE_PROVIDER_SITE_OTHER): Payer: Medicare Other | Admitting: General Surgery

## 2012-01-27 ENCOUNTER — Encounter (INDEPENDENT_AMBULATORY_CARE_PROVIDER_SITE_OTHER): Payer: Self-pay | Admitting: General Surgery

## 2012-01-27 VITALS — BP 124/70 | HR 76 | Temp 97.7°F | Resp 14 | Ht 67.0 in | Wt 188.6 lb

## 2012-01-27 DIAGNOSIS — C50919 Malignant neoplasm of unspecified site of unspecified female breast: Secondary | ICD-10-CM

## 2012-01-27 NOTE — Progress Notes (Signed)
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 She has been on clinical followup only. She reports no problems, specifically no chest wall masses or skin change or arm swelling or unusual pain.  Exam: Gen.: Well-appearing Caucasian female Lymph nodes: No cervical, supraclavicular, or axillary nodes palpable Lungs: Clear equal breath sounds bilaterally Breasts: Reconstructed left chest wall without masses or skin changes. Right breast negative.  Assessment and plan: Continues to do well with no evidence of recurrence. She is to return in 6-9 months.

## 2012-02-27 ENCOUNTER — Other Ambulatory Visit (HOSPITAL_BASED_OUTPATIENT_CLINIC_OR_DEPARTMENT_OTHER): Payer: Medicare Other | Admitting: Lab

## 2012-02-27 DIAGNOSIS — E119 Type 2 diabetes mellitus without complications: Secondary | ICD-10-CM

## 2012-02-27 DIAGNOSIS — C50219 Malignant neoplasm of upper-inner quadrant of unspecified female breast: Secondary | ICD-10-CM

## 2012-02-27 DIAGNOSIS — C50919 Malignant neoplasm of unspecified site of unspecified female breast: Secondary | ICD-10-CM

## 2012-02-27 LAB — CBC WITH DIFFERENTIAL/PLATELET
BASO%: 0.3 % (ref 0.0–2.0)
Basophils Absolute: 0 10*3/uL (ref 0.0–0.1)
EOS%: 1.8 % (ref 0.0–7.0)
Eosinophils Absolute: 0.1 10*3/uL (ref 0.0–0.5)
HCT: 37.5 % (ref 34.8–46.6)
HGB: 12.7 g/dL (ref 11.6–15.9)
LYMPH%: 36.7 % (ref 14.0–49.7)
MCH: 29.1 pg (ref 25.1–34.0)
MCHC: 33.9 g/dL (ref 31.5–36.0)
MCV: 85.8 fL (ref 79.5–101.0)
MONO#: 0.7 10*3/uL (ref 0.1–0.9)
MONO%: 8.2 % (ref 0.0–14.0)
NEUT#: 4.2 10*3/uL (ref 1.5–6.5)
NEUT%: 53 % (ref 38.4–76.8)
Platelets: 253 10*3/uL (ref 145–400)
RBC: 4.37 10*6/uL (ref 3.70–5.45)
RDW: 13.6 % (ref 11.2–14.5)
WBC: 7.9 10*3/uL (ref 3.9–10.3)
lymph#: 2.9 10*3/uL (ref 0.9–3.3)

## 2012-02-28 LAB — VITAMIN D 25 HYDROXY (VIT D DEFICIENCY, FRACTURES): Vit D, 25-Hydroxy: 51 ng/mL (ref 30–89)

## 2012-03-05 ENCOUNTER — Ambulatory Visit (HOSPITAL_BASED_OUTPATIENT_CLINIC_OR_DEPARTMENT_OTHER): Payer: Medicare Other | Admitting: Oncology

## 2012-03-05 VITALS — BP 112/70 | HR 85 | Temp 98.1°F | Resp 20 | Ht 67.0 in | Wt 190.5 lb

## 2012-03-05 DIAGNOSIS — C50219 Malignant neoplasm of upper-inner quadrant of unspecified female breast: Secondary | ICD-10-CM

## 2012-03-05 DIAGNOSIS — C50919 Malignant neoplasm of unspecified site of unspecified female breast: Secondary | ICD-10-CM

## 2012-03-05 NOTE — Progress Notes (Signed)
ID: Rodolph Bong   DOB: 1941-02-25  MR#: [id number]  NFA#:[id number]  PCP: Pearla Dubonnet, MD GYN:  SU: Jaclynn Guarneri MD OTHER MD: Antony Blackbird, Theola Sequin, Shirlean Kelly   HISTORY OF PRESENT ILLNESS: The patient palpated a mass in her left breast shortly before Christmas, December 2008.  She brought this to Dr. Kevan Ny attention, and he set her up for a left diagnostic mammogram and ultrasound at the [facility] July 08, 2007.  Dr. Roswell Nickel was able to palpate a firm mass at the 11 o'clock position in the left breast, which on mammography measured 2.5 cm, and was microlobulated.  This was new as compared to the prior mammogram in January 2008.  There was abundant microcalcification associated with this, and by ultrasound this proved to be a hypoechoic mass with posterior shadowing.  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    Bilateral breast MRIs were obtained at [facility].  This showed a 2.8 cm macrolobulated mass in the left breast, corresponding to the recently biopsied tumor.  There were no other masses or areas of abnormal enhancement in either breast, and no abnormal appearing lymph nodes.  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    The patient was then staged with details as noted above, but showing no metastatic disease.  Her case was presented at the Multidisciplinary Wednesday morning Breast Cancer Conference, and although the possibility of proceeding with radiation since clear margins were obtained was discussed, the consensus was the patient would be best served with proceeding to mastectomy since the closest margin was less than 1 cm  (specifically 5 mm).  Her subsequent history is as detailed below  INTERVAL HISTORY: Tekesha returns today for followup of her phyllodes tumor. The interval history is quite unremarkable. Her son in [place] is separating from his wife, and she gets to see her grandson who is 40 years old now more frequently. She is unable to exercise as much as she would like because of knee pain.  REVIEW OF SYSTEMS: She does describe herself as mildly fatigued. Occasionally she has some itching in her ears, or ankles Maxwell, and she has difficulty walking. She never learned to swim. She tells me her sugars are well-controlled. A detailed review of systems was entirely negative otherwise than as stated  PAST MEDICAL HISTORY: Past Medical History  Diagnosis Date  . Hyperlipidemia   . 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 Diabetes mellitus ORAL AND INSULIN MEDS  . Heart palpitations SINCE 2008 TAKES BETA BLOCKER  . PONV (postoperative nausea and vomiting)   . B12 deficiency anemia   . Hypertension   . Neuropathy, diabetic RIGHT FOOT MILD NUMBNESS  . Fibrosis of knee joint RIGHT  The past medical history is significant for diabetes mellitus, hypercholesterolemia, hypertension, history of a TAH/BSO at age 71, history of bilateral cataract surgery, history of cervical fusion, history of right knee replacement status post appendectomy, status post removal of a "benign lump" from her left breast remotely (the patient tells me her physician described this as a "milk gland".  This developed while she was pregnant with her son, who is currently 3, so it could have been duct ectasia or some other benign process.    PAST SURGICAL HISTORY: Past Surgical History  Procedure Date  . Knee arthrotomy 04-03-2011    RIGHT KNEE/ SCAR EXCISION/ POLYTHYLENE EXCHANGE  . Exchange left breast implant/ capsulotomy/ right  mastopexy 11-28-2008  . Total left mastectomy 09-29-2007  . Breast lumpectomy 07-27-2007    LEFT   . Manipulation knee joint 04-02-2006    CLOSED --  RIGHT   . Total knee arthroplasty 02-03-2006    RIGHT  . Cardiac catheterization 2001    MIMINAL PLAQUE RCA, CIRCUMFLEX, LAD;   NORMAL LVF  . Knee arthroscopy 1999    RIGHT  . Total abdominal hysterectomy w/ bilateral salpingoophorectomy 1989    AND APPENDECTOMY  . Left breast bx ;  x2 1976  . Tubal ligation 1976  . Urethral dilation 1976  . Cervical fusion 1996    C3 - 5  . Knee closed reduction 07/31/2011    Procedure: CLOSED MANIPULATION KNEE;  Surgeon: Loanne Drilling, MD;  Location: [facility];  Service: Orthopedics;  Laterality: Right;    FAMILY HISTORY Family History  Problem Relation Age of Onset  . Heart disease Mother   . Stroke Father   . Cancer Sister     stomach  . Cancer Brother     colon  The patient's father died at the age of 82 from a stroke.  The patient's mother died at the age of 61 from a myocardial infarction in the setting of diabetes.  The patient has a brother in his 37s, who was recently diagnosed with colon cancer.  He has severe diabetes.  One sister died at the age of 33 shortly after being diagnosed with stomach cancer.  (She had a total gastrectomy, but lived less than one month after surgery.)  Two sisters in their seventies survive with no cancer.    GYNECOLOGIC HISTORY: She is GX P2, first pregnancy age 71.  As noted, hysterectomy age 71.  She took hormone replacement until recently.  After quitting she did not notice a significant increase in hot flashes.   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  Her husband , Jerilynn Som, did the same thing for [employer].  Currently they are both retired.  They have [place], two dogs, and they generally keep busy.  Their son, Barbara Cower, lives in [place], works  for a Adult nurse in [employer], and has a young son.  Their daughter, Zella Ball, lives in [place], works for [employer] and has two children.  In addition to these three grandchildren, Jerilynn Som has two children from a prior marriage, in particular a son who lives in [place], and whose wife runs the computers for the [employer] (she has been diagnosed with colon cancer and is being treated at [facility]).  She has one grandchild from that son. Calvin's daughter, Shanda Bumps, works in [place] for [employer].    ADVANCED DIRECTIVES:  HEALTH MAINTENANCE: History  Substance Use Topics  . Smoking status: Never Smoker   . Smokeless tobacco: Never Used  . Alcohol Use: No     Colonoscopy:  PAP:  Bone density:  Lipid panel:  Allergies  Allergen Reactions  . Contrast Media (Iodinated Diagnostic Agents) Nausea And Vomiting  . Epinephrine Other (See Comments)    DIZZY AND ALMOST SYNCOPE  .  Sulfa Antibiotics Hives    Current Outpatient Prescriptions  Medication Sig Dispense Refill  . aspirin 81 MG tablet Take 81 mg by mouth daily.       . cholecalciferol (VITAMIN D) 400 UNITS TABS Take 2,000 Units by mouth daily.       . Cyanocobalamin (VITAMIN B-12 IJ) Inject as directed every 30 (thirty) days.      Marland Kitchen estradiol (ESTRACE) 0.5 MG tablet Take 0.5 mg by mouth daily.       . furosemide (LASIX) 10 MG/ML solution Take by mouth daily.      . insulin glargine (LANTUS) 100 UNIT/ML injection Inject 70 Units into the skin at bedtime.       . metFORMIN (GLUCOPHAGE-XR) 500 MG 24 hr tablet Take 500 mg by mouth 2 (two) times daily.       . metoprolol (LOPRESSOR) 50 MG tablet Take 50 mg by mouth 2 (two) times daily.       . rosuvastatin (CRESTOR) 10 MG tablet Take 10 mg by mouth every evening.       . sitaGLIPtin (JANUVIA) 100 MG tablet Take 100 mg by mouth daily.       . TOPROL XL 50 MG 24 hr tablet         OBJECTIVE: Middle-aged white woman who appears well Filed Vitals:   03/05/12 1020  BP: 112/70  Pulse: 85    Temp: 98.1 F (36.7 C)  Resp: 20     Body mass index is 29.84 kg/(m^2).    ECOG FS: 1  Sclerae unicteric Oropharynx clear No cervical or supraclavicular adenopathy Lungs no rales or rhonchi Heart regular rate and rhythm Abd benign MSK no focal spinal tenderness, no peripheral edema Neuro: nonfocal Breasts: The right breast is unremarkable. The left breast is status post mastectomy with reconstruction. There is no evidence of local recurrence. The left axilla is benign.  LAB RESULTS: Lab Results  Component Value Date   WBC 7.9 02/27/2012   NEUTROABS 4.2 02/27/2012   HGB 12.7 02/27/2012   HCT 37.5 02/27/2012   MCV 85.8 02/27/2012   PLT 253 02/27/2012      Chemistry      Component Value Date/Time   NA 141 07/31/2011 1023   K 4.4 07/31/2011 1023   CL 103 05/15/2011 1325   CO2 26 05/15/2011 1325   BUN 17 05/15/2011 1325   CREATININE 0.91 05/15/2011 1325      Component Value Date/Time   CALCIUM 9.6 05/15/2011 1325   ALKPHOS 50 05/15/2011 1325   AST 12 05/15/2011 1325   ALT 10 05/15/2011 1325   BILITOT 0.5 05/15/2011 1325       No results found for this basename: LABCA2    No components found with this basename: LABCA125    No results found for this basename: INR:1;PROTIME:1 in the last 168 hours  Urinalysis    Component Value Date/Time   COLORURINE YELLOW 03/27/2011 1105   APPEARANCEUR CLEAR 03/27/2011 1105   LABSPEC 1.014 03/27/2011 1105   PHURINE 5.0 03/27/2011 1105   GLUCOSEU NEGATIVE 03/27/2011 1105   HGBUR NEGATIVE 03/27/2011 1105   BILIRUBINUR NEGATIVE 03/27/2011 1105   KETONESUR NEGATIVE 03/27/2011 1105   PROTEINUR NEGATIVE 03/27/2011 1105   UROBILINOGEN 0.2 03/27/2011 1105   NITRITE NEGATIVE 03/27/2011 1105   LEUKOCYTESUR NEGATIVE 03/27/2011 1105    STUDIES: No results found. Last mammogram August of 2012. Last chest x-ray January 2013.  ASSESSMENT: 71 y.o. [place] woman status post left lumpectomy January 2009 for a  high-grade phyllodes tumor with an area of  osteosarcoma focally arising out of it, status post definitive left mastectomy March 2009 with no residual disease in the breast, with implant placement  PLAN: Tyjah is doing quite well. She will schedule her mammogram in the next couple of weeks. She will see me one more time, January of 2014, and before that visit we will repeat a chest x-ray and lab work. I have encouraged her to participate in the [employer] program at to see if that increases her functional status some. She knows to call for any problems that may develop before the next visit.   MAGRINAT,GUSTAV C    03/05/2012

## 2012-03-06 ENCOUNTER — Telehealth: Payer: Self-pay | Admitting: *Deleted

## 2012-03-06 NOTE — Telephone Encounter (Signed)
Gave patient appointment for 08-03-2012 at 11:30am gave patient instructions of going to get chest x-ray done a few days before

## 2012-03-23 ENCOUNTER — Other Ambulatory Visit: Payer: Self-pay | Admitting: Oncology

## 2012-04-02 ENCOUNTER — Ambulatory Visit
Admission: RE | Admit: 2012-04-02 | Discharge: 2012-04-02 | Disposition: A | Discharge status: Home / Self Care | Payer: Medicare Other | Source: Ambulatory Visit | Attending: Oncology | Admitting: Oncology

## 2012-04-02 ENCOUNTER — Other Ambulatory Visit: Payer: Self-pay | Admitting: Oncology

## 2012-04-02 DIAGNOSIS — Z9012 Acquired absence of left breast and nipple: Secondary | ICD-10-CM

## 2012-04-02 DIAGNOSIS — Z1231 Encounter for screening mammogram for malignant neoplasm of breast: Secondary | ICD-10-CM

## 2012-07-20 ENCOUNTER — Other Ambulatory Visit (HOSPITAL_BASED_OUTPATIENT_CLINIC_OR_DEPARTMENT_OTHER): Payer: Medicare Other | Admitting: Lab

## 2012-07-20 DIAGNOSIS — C50919 Malignant neoplasm of unspecified site of unspecified female breast: Secondary | ICD-10-CM

## 2012-07-20 DIAGNOSIS — C50219 Malignant neoplasm of upper-inner quadrant of unspecified female breast: Secondary | ICD-10-CM

## 2012-07-20 LAB — COMPREHENSIVE METABOLIC PANEL (CC13)
ALT: 15 U/L (ref 0–55)
AST: 15 U/L (ref 5–34)
Albumin: 3.1 g/dL — ABNORMAL LOW (ref 3.5–5.0)
Alkaline Phosphatase: 56 U/L (ref 40–150)
BUN: 16 mg/dL (ref 7.0–26.0)
CO2: 23 mEq/L (ref 22–29)
Calcium: 9.2 mg/dL (ref 8.4–10.4)
Chloride: 110 mEq/L — ABNORMAL HIGH (ref 98–107)
Creatinine: 0.8 mg/dL (ref 0.6–1.1)
Glucose: 146 mg/dl — ABNORMAL HIGH (ref 70–99)
Potassium: 3.9 mEq/L (ref 3.5–5.1)
Sodium: 141 mEq/L (ref 136–145)
Total Bilirubin: 0.5 mg/dL (ref 0.20–1.20)
Total Protein: 6.7 g/dL (ref 6.4–8.3)

## 2012-07-20 LAB — CBC & DIFF AND RETIC
BASO%: 0.3 % (ref 0.0–2.0)
Basophils Absolute: 0 10*3/uL (ref 0.0–0.1)
EOS%: 2.9 % (ref 0.0–7.0)
Eosinophils Absolute: 0.2 10*3/uL (ref 0.0–0.5)
HCT: 38.3 % (ref 34.8–46.6)
HGB: 12.9 g/dL (ref 11.6–15.9)
Immature Retic Fract: 13.2 % — ABNORMAL HIGH (ref 1.60–10.00)
LYMPH%: 33.9 % (ref 14.0–49.7)
MCH: 29.7 pg (ref 25.1–34.0)
MCHC: 33.7 g/dL (ref 31.5–36.0)
MCV: 88 fL (ref 79.5–101.0)
MONO#: 0.6 10*3/uL (ref 0.1–0.9)
MONO%: 7.4 % (ref 0.0–14.0)
NEUT#: 4.2 10*3/uL (ref 1.5–6.5)
NEUT%: 55.5 % (ref 38.4–76.8)
Platelets: 258 10*3/uL (ref 145–400)
RBC: 4.35 10*6/uL (ref 3.70–5.45)
RDW: 13.8 % (ref 11.2–14.5)
Retic %: 1.89 % (ref 0.70–2.10)
Retic Ct Abs: 82.22 10*3/uL (ref 33.70–90.70)
WBC: 7.5 10*3/uL (ref 3.9–10.3)
lymph#: 2.5 10*3/uL (ref 0.9–3.3)

## 2012-08-03 ENCOUNTER — Ambulatory Visit (HOSPITAL_BASED_OUTPATIENT_CLINIC_OR_DEPARTMENT_OTHER): Payer: Medicare Other | Admitting: Oncology

## 2012-08-03 VITALS — BP 134/70 | HR 75 | Temp 98.0°F | Resp 20 | Ht 67.0 in | Wt 190.3 lb

## 2012-08-03 DIAGNOSIS — C50919 Malignant neoplasm of unspecified site of unspecified female breast: Secondary | ICD-10-CM

## 2012-08-03 DIAGNOSIS — C50219 Malignant neoplasm of upper-inner quadrant of unspecified female breast: Secondary | ICD-10-CM

## 2012-08-03 NOTE — Progress Notes (Signed)
ID: [name]   DOB: 1941/05/06  MR#: [id number]  WUJ#:[id number]  PCP: Pearla Dubonnet, MD GYN:  SU: Jaclynn Guarneri MD OTHER MD: Antony Blackbird, Theola Sequin, Shirlean Kelly   HISTORY OF PRESENT ILLNESS: The patient palpated a mass in her left breast shortly before Christmas, December 2008.  She brought this to Dr. Kevan Ny attention, and he set her up for a left diagnostic mammogram and ultrasound at the [facility] July 08, 2007.  Dr. Roswell Nickel was able to palpate a firm mass at the 11 o'clock position in the left breast, which on mammography measured 2.5 cm, and was microlobulated.  This was new as compared to the prior mammogram in January 2008.  There was abundant microcalcification associated with this, and by ultrasound this proved to be a hypoechoic mass with posterior shadowing.  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    Bilateral breast MRIs were obtained at [facility].  This showed a 2.8 cm macrolobulated mass in the left breast, corresponding to the recently biopsied tumor.  There were no other masses or areas of abnormal enhancement in either breast, and no abnormal appearing lymph nodes.  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    The patient was then staged with details as noted above, but showing no metastatic disease.  Her case was presented at the Multidisciplinary Wednesday morning Breast Cancer Conference, and although the possibility of proceeding with radiation since clear margins were obtained was discussed, the consensus was the patient would be best served with proceeding to mastectomy since the closest margin was less than 1 cm  (specifically 5 mm).  Her subsequent history is as detailed below  INTERVAL HISTORY: [name] returns today for followup of her phyllodes tumor. The interval history is quite unremarkable. Since the last visit here she participated in the [employer] program and really enjoyed it. She has recently signed up for [employer] sne[employer].   REVIEW OF SYSTEMS: Her worse problem is her right knee. At this keeps her from walking as much as she would like, but she is planning to do a variety of exercises that do not involve the knee. She tells me her diabetes is well-controlled. She has seen no changes in her breast, and has had no unusual headaches, visual changes, cough, phlegm production, pleurisy, or shortness of breath. There has been no change in bowel or bladder habits. A detailed review of systems today was noncontributory.  PAST MEDICAL HISTORY: Past Medical History  Diagnosis Date  . Hyperlipidemia   . 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 Diabetes mellitus ORAL AND INSULIN MEDS  . Heart palpitations SINCE 2008 TAKES BETA BLOCKER  . PONV (postoperative nausea and vomiting)   . B12 deficiency anemia   . Hypertension   . Neuropathy, diabetic RIGHT FOOT MILD NUMBNESS  . Fibrosis of knee joint RIGHT  The past medical history is significant for diabetes mellitus, hypercholesterolemia, hypertension, history of a TAH/BSO at age 48, history of bilateral cataract surgery, history of cervical fusion, history of right knee replacement status post appendectomy, status post  removal of a "benign lump" from her left breast remotely (the patient tells me her physician described this as a "milk gland".  This developed while she was pregnant with her son, who is currently 22, so it could have been duct ectasia or some other benign process.    PAST SURGICAL HISTORY: Past Surgical History  Procedure Date  . Knee  arthrotomy 04-03-2011    RIGHT KNEE/ SCAR EXCISION/ POLYTHYLENE EXCHANGE  . Exchange left breast implant/ capsulotomy/ right mastopexy 11-28-2008  . Total left mastectomy 09-29-2007  . Breast lumpectomy 07-27-2007    LEFT   . Manipulation knee joint 04-02-2006    CLOSED --  RIGHT   . Total knee arthroplasty 02-03-2006    RIGHT  . Cardiac catheterization 2001    MIMINAL PLAQUE RCA, CIRCUMFLEX, LAD;   NORMAL LVF  . Knee arthroscopy 1999    RIGHT  . Total abdominal hysterectomy w/ bilateral salpingoophorectomy 1989    AND APPENDECTOMY  . Left breast bx ;  x2 1976  . Tubal ligation 1976  . Urethral dilation 1976  . Cervical fusion 1996    C3 - 5  . Knee closed reduction 07/31/2011    Procedure: CLOSED MANIPULATION KNEE;  Surgeon: Loanne Drilling, MD;  Location: [facility];  Service: Orthopedics;  Laterality: Right;    FAMILY HISTORY Family History  Problem Relation Age of Onset  . Heart disease Mother   . Stroke Father   . Cancer Sister     stomach  . Cancer Brother     colon  The patient's father died at the age of 7 from a stroke.  The patient's mother died at the age of 77 from a myocardial infarction in the setting of diabetes.  The patient has a brother in his 15s, who was recently diagnosed with colon cancer.  He has severe diabetes.  One sister died at the age of 65 shortly after being diagnosed with stomach cancer.  (She had a total gastrectomy, but lived less than one month after surgery.)  Two sisters in their seventies survive with no cancer.    GYNECOLOGIC HISTORY: She is GX P2, first pregnancy age 39.  As noted, hysterectomy age 34.  She took hormone replacement until recently.  After quitting she did not notice a significant increase in hot flashes.   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  Her husband , [name], did the same thing for [employer].  Currently they are both  retired.  They have [place], two dogs, and they generally keep busy.  Their son, Barbara Cower, lives in [place], works for a Adult nurse in [employer], and has a young son.  Their daughter, Zella Ball, lives in [place], works for [employer] and has two children.  In addition to these three grandchildren, [name] has two children from a prior marriage, in particular a son who lives in [place], and whose wife runs the computers for the [employer] (she has been diagnosed with colon cancer and is being treated at [facility]).  She has one grandchild from that son. Calvin's daughter, Shanda Bumps, works in [place] for [employer].    ADVANCED DIRECTIVES:  HEALTH MAINTENANCE: History  Substance Use Topics  . Smoking status: Never Smoker   . Smokeless tobacco: Never Used  . Alcohol Use: No     Colonoscopy:  PAP:  Bone density:  Lipid panel:  Allergies  Allergen Reactions  .  Contrast Media (Iodinated Diagnostic Agents) Nausea And Vomiting  . Epinephrine Other (See Comments)    DIZZY AND ALMOST SYNCOPE  . Sulfa Antibiotics Hives    Current Outpatient Prescriptions  Medication Sig Dispense Refill  . aspirin 81 MG tablet Take 81 mg by mouth daily.       . cholecalciferol (VITAMIN D) 400 UNITS TABS Take 2,000 Units by mouth daily.       . Cyanocobalamin (VITAMIN B-12 IJ) Inject as directed every 30 (thirty) days.      Marland Kitchen estradiol (ESTRACE) 0.5 MG tablet Take 0.5 mg by mouth daily.       . furosemide (LASIX) 10 MG/ML solution Take by mouth daily.      . insulin glargine (LANTUS) 100 UNIT/ML injection Inject 70 Units into the skin at bedtime.       . metFORMIN (GLUCOPHAGE-XR) 500 MG 24 hr tablet Take 500 mg by mouth 2 (two) times daily.       . metoprolol (LOPRESSOR) 50 MG tablet Take 50 mg by mouth 2 (two) times daily.       . rosuvastatin (CRESTOR) 10 MG tablet Take 10 mg by mouth every evening.       . sitaGLIPtin (JANUVIA) 100 MG tablet Take 100 mg by mouth daily.       . TOPROL XL 50 MG 24 hr  tablet         OBJECTIVE: Middle-aged white woman in no acute distress Filed Vitals:   08/03/12 1135  BP: 134/70  Pulse: 75  Temp: 98 F (36.7 C)  Resp: 20     Body mass index is 29.81 kg/(m^2).    ECOG FS: 1  Sclerae unicteric Oropharynx clear No cervical or supraclavicular adenopathy Lungs no rales or rhonchi Heart regular rate and rhythm Abd benign MSK mild kyphosis but no focal spinal tenderness, no peripheral edema Neuro: nonfocal, well oriented, positive affect Breasts: The right breast is unremarkable. The left breast is status post mastectomy with reconstruction. There is no evidence of local recurrence. The left axilla is benign.  LAB RESULTS: Lab Results  Component Value Date   WBC 7.5 07/20/2012   NEUTROABS 4.2 07/20/2012   HGB 12.9 07/20/2012   HCT 38.3 07/20/2012   MCV 88.0 07/20/2012   PLT 258 07/20/2012      Chemistry      Component Value Date/Time   NA 141 07/20/2012 0959   NA 141 07/31/2011 1023   K 3.9 07/20/2012 0959   K 4.4 07/31/2011 1023   CL 110* 07/20/2012 0959   CL 103 05/15/2011 1325   CO2 23 07/20/2012 0959   CO2 26 05/15/2011 1325   BUN 16.0 07/20/2012 0959   BUN 17 05/15/2011 1325   CREATININE 0.8 07/20/2012 0959   CREATININE 0.91 05/15/2011 1325      Component Value Date/Time   CALCIUM 9.2 07/20/2012 0959   CALCIUM 9.6 05/15/2011 1325   ALKPHOS 56 07/20/2012 0959   ALKPHOS 50 05/15/2011 1325   AST 15 07/20/2012 0959   AST 12 05/15/2011 1325   ALT 15 07/20/2012 0959   ALT 10 05/15/2011 1325   BILITOT 0.50 07/20/2012 0959   BILITOT 0.5 05/15/2011 1325       No results found for this basename: LABCA2    No components found with this basename: LABCA125    No results found for this basename: INR:1;PROTIME:1 in the last 168 hours  Urinalysis    Component Value Date/Time   COLORURINE YELLOW 03/27/2011 1105  APPEARANCEUR CLEAR 03/27/2011 1105   LABSPEC 1.014 03/27/2011 1105   PHURINE 5.0 03/27/2011 1105   GLUCOSEU NEGATIVE 03/27/2011 1105   HGBUR  NEGATIVE 03/27/2011 1105   BILIRUBINUR NEGATIVE 03/27/2011 1105   KETONESUR NEGATIVE 03/27/2011 1105   PROTEINUR NEGATIVE 03/27/2011 1105   UROBILINOGEN 0.2 03/27/2011 1105   NITRITE NEGATIVE 03/27/2011 1105   LEUKOCYTESUR NEGATIVE 03/27/2011 1105    STUDIES: Most recent mammogram 04/02/2012 was unremarkable.  ASSESSMENT: 72 y.o. [place] woman status post left lumpectomy January 2009 for a high-grade phyllodes tumor with an area of osteosarcoma focally arising out of it, status post definitive left mastectomy March 2009 with no residual disease in the breast, with implant placement  PLAN: Ashana is doing fine as far as her phyllodes tumor is concerned, with no evidence of disease activity or recurrence. At this point uncomfortable releasing her from followup here, and she is "graduating" today. She knows that we will always be glad to see her on an as needed basis in the future.   MAGRINAT,GUSTAV C    08/03/2012

## 2012-08-04 ENCOUNTER — Telehealth: Payer: Self-pay | Admitting: Oncology

## 2012-08-04 NOTE — Telephone Encounter (Signed)
Sent letter to Dr. Marden Noble office from Dr. Darnelle Catalan

## 2013-01-28 ENCOUNTER — Ambulatory Visit (INDEPENDENT_AMBULATORY_CARE_PROVIDER_SITE_OTHER): Payer: Medicare Other | Admitting: General Surgery

## 2013-01-28 ENCOUNTER — Encounter (INDEPENDENT_AMBULATORY_CARE_PROVIDER_SITE_OTHER): Payer: Self-pay | Admitting: General Surgery

## 2013-01-28 VITALS — BP 128/70 | HR 72 | Resp 14 | Ht 67.0 in | Wt 178.6 lb

## 2013-01-28 DIAGNOSIS — C50919 Malignant neoplasm of unspecified site of unspecified female breast: Secondary | ICD-10-CM

## 2013-01-28 DIAGNOSIS — C50912 Malignant neoplasm of unspecified site of left female breast: Secondary | ICD-10-CM

## 2013-01-28 NOTE — Progress Notes (Signed)
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 She has been on clinical followup only. She reports no problems, specifically no chest wall masses or skin change or arm swelling or unusual pain.  Exam: Gen.: Well-appearing Caucasian female Lymph nodes: No cervical, supraclavicular, or axillary nodes palpable Lungs: Clear equal breath sounds bilaterally Breasts: Reconstructed left chest wall without masses or skin changes. Right breast negative.  Assessment and plan: Continues to do well with no evidence of recurrence. Over 5 years following treatment. At this point she is discharged to return as needed.

## 2013-03-02 ENCOUNTER — Other Ambulatory Visit: Payer: Self-pay

## 2013-03-02 DIAGNOSIS — Z1231 Encounter for screening mammogram for malignant neoplasm of breast: Secondary | ICD-10-CM

## 2013-03-02 DIAGNOSIS — Z9012 Acquired absence of left breast and nipple: Secondary | ICD-10-CM

## 2013-04-06 ENCOUNTER — Ambulatory Visit
Admission: RE | Admit: 2013-04-06 | Discharge: 2013-04-06 | Disposition: A | Discharge status: Home / Self Care | Payer: Medicare Other | Source: Ambulatory Visit

## 2013-04-06 DIAGNOSIS — Z1231 Encounter for screening mammogram for malignant neoplasm of breast: Secondary | ICD-10-CM

## 2013-04-06 DIAGNOSIS — Z9012 Acquired absence of left breast and nipple: Secondary | ICD-10-CM

## 2013-12-27 ENCOUNTER — Other Ambulatory Visit: Payer: Self-pay | Admitting: Internal Medicine

## 2013-12-27 DIAGNOSIS — R339 Retention of urine, unspecified: Secondary | ICD-10-CM

## 2014-01-17 ENCOUNTER — Encounter (INDEPENDENT_AMBULATORY_CARE_PROVIDER_SITE_OTHER): Payer: Self-pay

## 2014-01-17 ENCOUNTER — Ambulatory Visit
Admission: RE | Admit: 2014-01-17 | Discharge: 2014-01-17 | Disposition: A | Discharge status: Home / Self Care | Payer: Medicare Other | Source: Ambulatory Visit | Attending: Internal Medicine | Admitting: Internal Medicine

## 2014-01-17 DIAGNOSIS — R339 Retention of urine, unspecified: Secondary | ICD-10-CM

## 2014-01-18 ENCOUNTER — Other Ambulatory Visit: Payer: Self-pay | Admitting: Internal Medicine

## 2014-01-18 DIAGNOSIS — R339 Retention of urine, unspecified: Secondary | ICD-10-CM

## 2014-01-20 ENCOUNTER — Other Ambulatory Visit: Payer: Self-pay | Admitting: Internal Medicine

## 2014-01-20 ENCOUNTER — Ambulatory Visit
Admission: RE | Admit: 2014-01-20 | Discharge: 2014-01-20 | Disposition: A | Discharge status: Home / Self Care | Payer: Medicare Other | Source: Ambulatory Visit | Attending: Internal Medicine | Admitting: Internal Medicine

## 2014-01-20 DIAGNOSIS — R339 Retention of urine, unspecified: Secondary | ICD-10-CM

## 2014-01-31 ENCOUNTER — Other Ambulatory Visit: Payer: Self-pay | Admitting: Dermatology

## 2014-05-18 ENCOUNTER — Other Ambulatory Visit: Payer: Self-pay

## 2014-05-18 DIAGNOSIS — Z1231 Encounter for screening mammogram for malignant neoplasm of breast: Secondary | ICD-10-CM

## 2014-05-20 ENCOUNTER — Ambulatory Visit
Admission: RE | Admit: 2014-05-20 | Discharge: 2014-05-20 | Disposition: A | Discharge status: Home / Self Care | Payer: Medicare Other | Source: Ambulatory Visit

## 2014-05-20 DIAGNOSIS — Z1231 Encounter for screening mammogram for malignant neoplasm of breast: Secondary | ICD-10-CM

## 2014-05-23 ENCOUNTER — Other Ambulatory Visit: Payer: Self-pay | Admitting: Oncology

## 2014-05-23 DIAGNOSIS — R928 Other abnormal and inconclusive findings on diagnostic imaging of breast: Secondary | ICD-10-CM

## 2014-06-08 ENCOUNTER — Ambulatory Visit
Admission: RE | Admit: 2014-06-08 | Discharge: 2014-06-08 | Disposition: A | Discharge status: Home / Self Care | Payer: Medicare Other | Source: Ambulatory Visit | Attending: Oncology | Admitting: Oncology

## 2014-06-08 DIAGNOSIS — R928 Other abnormal and inconclusive findings on diagnostic imaging of breast: Secondary | ICD-10-CM

## 2015-05-17 ENCOUNTER — Other Ambulatory Visit: Payer: Self-pay | Admitting: Gastroenterology

## 2015-05-17 DIAGNOSIS — R1310 Dysphagia, unspecified: Secondary | ICD-10-CM

## 2015-05-22 ENCOUNTER — Other Ambulatory Visit: Payer: Self-pay

## 2015-05-22 DIAGNOSIS — Z1231 Encounter for screening mammogram for malignant neoplasm of breast: Secondary | ICD-10-CM

## 2015-05-24 ENCOUNTER — Ambulatory Visit
Admission: RE | Admit: 2015-05-24 | Discharge: 2015-05-24 | Disposition: A | Discharge status: Home / Self Care | Payer: Medicare Other | Source: Ambulatory Visit | Attending: Gastroenterology | Admitting: Gastroenterology

## 2015-05-24 DIAGNOSIS — R1310 Dysphagia, unspecified: Secondary | ICD-10-CM

## 2015-05-25 ENCOUNTER — Other Ambulatory Visit: Payer: Self-pay | Admitting: Gastroenterology

## 2015-05-29 ENCOUNTER — Encounter (HOSPITAL_COMMUNITY): Payer: Self-pay | Admitting: *Deleted

## 2015-06-11 NOTE — Anesthesia Preprocedure Evaluation (Addendum)
Anesthesia Evaluation  Patient identified by MRN, date of birth, ID band Patient awake    Reviewed: Allergy & Precautions, NPO status , Patient's Chart, lab work & pertinent test results  History of Anesthesia Complications (+) PONV and history of anesthetic complications  Airway Mallampati: III  TM Distance: >3 FB Neck ROM: Full    Dental no notable dental hx. (+) Partial Lower   Pulmonary neg pulmonary ROS,    Pulmonary exam normal breath sounds clear to auscultation       Cardiovascular hypertension, Pt. on medications negative cardio ROS Normal cardiovascular exam Rhythm:Regular Rate:Normal     Neuro/Psych negative neurological ROS  negative psych ROS   GI/Hepatic negative GI ROS, Neg liver ROS,   Endo/Other  diabetes, Type 2, Insulin Dependent, Oral Hypoglycemic Agents  Renal/GU negative Renal ROS  negative genitourinary   Musculoskeletal negative musculoskeletal ROS (+)   Abdominal   Peds negative pediatric ROS (+)  Hematology negative hematology ROS (+)   Anesthesia Other Findings   Reproductive/Obstetrics negative OB ROS                            Anesthesia Physical Anesthesia Plan  ASA: III  Anesthesia Plan: MAC   Post-op Pain Management:    Induction: Intravenous  Airway Management Planned:   Additional Equipment:   Intra-op Plan:   Post-operative Plan:   Informed Consent: I have reviewed the patients History and Physical, chart, labs and discussed the procedure including the risks, benefits and alternatives for the proposed anesthesia with the patient or authorized representative who has indicated his/her understanding and acceptance.   Dental advisory given  Plan Discussed with: CRNA  Anesthesia Plan Comments:         Anesthesia Quick Evaluation

## 2015-06-12 ENCOUNTER — Ambulatory Visit (HOSPITAL_COMMUNITY)
Admission: RE | Admit: 2015-06-12 | Discharge: 2015-06-12 | Disposition: A | Discharge status: Home / Self Care | Payer: Medicare Other | Source: Ambulatory Visit | Attending: Gastroenterology | Admitting: Gastroenterology

## 2015-06-12 ENCOUNTER — Encounter (HOSPITAL_COMMUNITY)
Admission: RE | Disposition: A | Discharge status: Home / Self Care | Payer: Self-pay | Source: Ambulatory Visit | Attending: Gastroenterology

## 2015-06-12 ENCOUNTER — Ambulatory Visit (HOSPITAL_COMMUNITY): Payer: Medicare Other | Admitting: Anesthesiology

## 2015-06-12 ENCOUNTER — Encounter (HOSPITAL_COMMUNITY): Payer: Self-pay

## 2015-06-12 DIAGNOSIS — E7800 Pure hypercholesterolemia, unspecified: Secondary | ICD-10-CM | POA: Diagnosis not present

## 2015-06-12 DIAGNOSIS — E119 Type 2 diabetes mellitus without complications: Secondary | ICD-10-CM | POA: Diagnosis not present

## 2015-06-12 DIAGNOSIS — Z794 Long term (current) use of insulin: Secondary | ICD-10-CM | POA: Diagnosis not present

## 2015-06-12 DIAGNOSIS — Z96651 Presence of right artificial knee joint: Secondary | ICD-10-CM | POA: Insufficient documentation

## 2015-06-12 DIAGNOSIS — I10 Essential (primary) hypertension: Secondary | ICD-10-CM | POA: Diagnosis not present

## 2015-06-12 DIAGNOSIS — K222 Esophageal obstruction: Secondary | ICD-10-CM | POA: Insufficient documentation

## 2015-06-12 DIAGNOSIS — R1310 Dysphagia, unspecified: Secondary | ICD-10-CM | POA: Diagnosis present

## 2015-06-12 DIAGNOSIS — K219 Gastro-esophageal reflux disease without esophagitis: Secondary | ICD-10-CM | POA: Diagnosis not present

## 2015-06-12 HISTORY — PX: BALLOON DILATION: SHX5330

## 2015-06-12 HISTORY — PX: ESOPHAGOGASTRODUODENOSCOPY (EGD) WITH PROPOFOL: SHX5813

## 2015-06-12 LAB — GLUCOSE, CAPILLARY: Glucose-Capillary: 146 mg/dL — ABNORMAL HIGH (ref 65–99)

## 2015-06-12 SURGERY — BALLOON DILATION
Anesthesia: Monitor Anesthesia Care

## 2015-06-12 MED ORDER — LACTATED RINGERS IV SOLN
INTRAVENOUS | Status: DC
  Administered 2015-06-12: 08:00:00 via INTRAVENOUS
  Administered 2015-06-12: 1000 mL via INTRAVENOUS

## 2015-06-12 MED ORDER — PROPOFOL 10 MG/ML IV BOLUS
INTRAVENOUS | Status: AC
  Filled 2015-06-12: qty 40

## 2015-06-12 MED ORDER — LIDOCAINE HCL (CARDIAC) 20 MG/ML IV SOLN
INTRAVENOUS | Status: DC | PRN
  Administered 2015-06-12: 50 mg via INTRAVENOUS

## 2015-06-12 MED ORDER — SODIUM CHLORIDE 0.9 % IV SOLN: INTRAVENOUS | Status: DC

## 2015-06-12 MED ORDER — LIDOCAINE HCL 1 % IJ SOLN
INTRAMUSCULAR | Status: AC
  Filled 2015-06-12: qty 20

## 2015-06-12 MED ORDER — PROPOFOL 500 MG/50ML IV EMUL
INTRAVENOUS | Status: DC | PRN
  Administered 2015-06-12: 50 ug/kg/min via INTRAVENOUS

## 2015-06-12 SURGICAL SUPPLY — 15 items

## 2015-06-12 NOTE — Anesthesia Postprocedure Evaluation (Signed)
Anesthesia Post Note  Patient: [name]  Procedure(s) Performed: Procedure(s) (LRB): ESOPHAGOGASTRODUODENOSCOPY (EGD) WITH PROPOFOL (N/A) BALLOON DILATION (N/A)  Patient location during evaluation: PACU Anesthesia Type: MAC Level of consciousness: awake and alert Pain management: pain level controlled Vital Signs Assessment: post-procedure vital signs reviewed and stable Respiratory status: spontaneous breathing, nonlabored ventilation, respiratory function stable and patient connected to nasal cannula oxygen Cardiovascular status: blood pressure returned to baseline and stable Postop Assessment: no signs of nausea or vomiting Anesthetic complications: no    Last Vitals:  Filed Vitals:   06/12/15 0820 06/12/15 0830  BP: 120/50 112/53  Pulse: 68 72  Temp:    Resp: 20 20    Last Pain:  Filed Vitals:   06/12/15 0835  PainSc: 5                  Leslie Langille, [name]

## 2015-06-12 NOTE — Transfer of Care (Signed)
Immediate Anesthesia Transfer of Care Note  Patient: [name]  Procedure(s) Performed: Procedure(s): ESOPHAGOGASTRODUODENOSCOPY (EGD) WITH PROPOFOL (N/A) BALLOON DILATION (N/A)  Patient Location: PACU  Anesthesia Type:MAC  Level of Consciousness: awake, alert  and oriented  Airway & Oxygen Therapy: Patient Spontanous Breathing  Post-op Assessment: Report given to RN and Post -op Vital signs reviewed and stable  Post vital signs: Reviewed and stable  Last Vitals:  Filed Vitals:   06/12/15 0628  BP: 122/51  Pulse: 74  Temp: 36.6 C  Resp: 18    Complications: No apparent anesthesia complications

## 2015-06-12 NOTE — H&P (Signed)
  Problem: Esophageal dysphagia. 05/24/2015 barium esophagram showed narrowing of the distal esophagus consistent with an esophageal stricture. Normal screening colonoscopies performed in 2006 and 2011.  History: The patient is a 74 year old female born October 21, 1940. She has had intermittent solid food and liquid esophageal dysphagia unassociated with odynophagia, nausea, or vomiting for the past 6 years. She has a history of chronic gastroesophageal reflux associated with a normal esophagogastroduodenoscopy performed in 2006. After losing weight, she was able to discontinue her proton pump inhibitor therapy.  The patient is scheduled to undergo diagnostic esophagogastroduodenoscopy with esophageal stricture dilation today.  Past medical history: Total right knee replacement surgery. Cervical disc surgery. Total abdominal hysterectomy. Bilateral salpingo-oophorectomy. Tubal ligation. Left mastectomy. Osteosarcoma of the left breast. Hypercholesterolemia. Type 2 diabetes mellitus. Vitamin D deficiency. Hypertension. Seasonal allergic rhinitis. Vitamin B 12 deficiency.  Medication allergies: Sulfa drugs cause rash.  Exam: The patient is alert and lying comfortably on the endoscopy stretcher. Abdomen is soft and nontender to palpation. Cardiac exam reveals a regular rhythm. Lungs are clear to auscultation.  Plan: Proceed with diagnostic esophagogastroduodenoscopy with esophageal stricture dilation

## 2015-06-12 NOTE — Op Note (Signed)
Problem: Esophageal dysphagia. Barium esophagram showed a short segment distal esophageal stricture  Endoscopist: Earle Gell  Premedication: Propofol administered by anesthesia  Procedure: Diagnostic esophagogastroduodenoscopy with balloon esophageal stricture dilation The patient was placed in the left lateral decubitus position. The Pentax gastroscope was passed through the posterior hypopharynx into the proximal esophagus without difficulty. The hypopharynx, larynx, and vocal cords appeared normal.  Esophagoscopy: The proximal and mid segments of the esophageal mucosa appeared normal. The squamocolumnar junction was noted at 40 cm from the incisor teeth. There was no endoscopic evidence for the presence of erosive esophagitis, varices esophagus, or obvious esophageal stricture at the esophagogastric junction. I was easily able to traverse the entire length of the esophagus and entered the stomach. Using the esophageal balloon dilator, the balloon was inflated at the esophagogastric junction from 15 mm to 16.5 mm causing esophageal mucosal disruption consistent with esophageal stricture dilation.  Gastroscopy: Retroflex view of the gastric cardia and fundus was normal. The gastric body, antrum, and pylorus appeared normal.  Duodenoscopy: The duodenal bulb and descending duodenum appeared normal.  Assessment: Short segment stricture at the esophagogastric junction dilated from 15 mm to 16.5 mm without apparent complications. Otherwise normal esophagogastroduodenoscopy.  Recommendation: If the patient experiences esophageal dysphagia despite apparent esophageal stricture dilation, schedule a high resolution esophageal manometry rule out achalasia.

## 2015-06-12 NOTE — Discharge Instructions (Signed)

## 2015-06-13 ENCOUNTER — Encounter (HOSPITAL_COMMUNITY): Payer: Self-pay | Admitting: Gastroenterology

## 2015-06-26 ENCOUNTER — Ambulatory Visit
Admission: RE | Admit: 2015-06-26 | Discharge: 2015-06-26 | Disposition: A | Discharge status: Home / Self Care | Payer: Medicare Other | Source: Ambulatory Visit

## 2015-06-26 DIAGNOSIS — Z1231 Encounter for screening mammogram for malignant neoplasm of breast: Secondary | ICD-10-CM

## 2015-06-28 ENCOUNTER — Telehealth: Payer: Self-pay

## 2015-06-28 ENCOUNTER — Other Ambulatory Visit: Payer: Self-pay | Admitting: Internal Medicine

## 2015-06-28 DIAGNOSIS — R928 Other abnormal and inconclusive findings on diagnostic imaging of breast: Secondary | ICD-10-CM

## 2015-06-28 NOTE — Telephone Encounter (Signed)
Received cardiac clearance request from [name] for Dr.Smith Pt has not been seen in at leatst 2+ years.Pt has not been seen at this location. lmon for pt to call back and scheduled a pre-op cardiac clearance appt

## 2015-07-05 ENCOUNTER — Ambulatory Visit
Admission: RE | Admit: 2015-07-05 | Discharge: 2015-07-05 | Disposition: A | Discharge status: Home / Self Care | Payer: Medicare Other | Source: Ambulatory Visit | Attending: Internal Medicine | Admitting: Internal Medicine

## 2015-07-05 DIAGNOSIS — R928 Other abnormal and inconclusive findings on diagnostic imaging of breast: Secondary | ICD-10-CM

## 2015-08-24 DIAGNOSIS — Z01810 Encounter for preprocedural cardiovascular examination: Secondary | ICD-10-CM | POA: Insufficient documentation

## 2015-08-25 ENCOUNTER — Encounter: Payer: Self-pay | Admitting: Interventional Cardiology

## 2015-08-25 ENCOUNTER — Ambulatory Visit (INDEPENDENT_AMBULATORY_CARE_PROVIDER_SITE_OTHER): Payer: Medicare Other | Admitting: Interventional Cardiology

## 2015-08-25 VITALS — BP 120/74 | HR 79 | Ht 67.0 in | Wt 170.8 lb

## 2015-08-25 DIAGNOSIS — I10 Essential (primary) hypertension: Secondary | ICD-10-CM | POA: Diagnosis not present

## 2015-08-25 DIAGNOSIS — Z01810 Encounter for preprocedural cardiovascular examination: Secondary | ICD-10-CM | POA: Diagnosis not present

## 2015-08-25 DIAGNOSIS — E1151 Type 2 diabetes mellitus with diabetic peripheral angiopathy without gangrene: Secondary | ICD-10-CM | POA: Diagnosis not present

## 2015-08-25 DIAGNOSIS — E785 Hyperlipidemia, unspecified: Secondary | ICD-10-CM | POA: Diagnosis not present

## 2015-08-25 NOTE — Patient Instructions (Signed)
Medication Instructions:  Your physician recommends that you continue on your current medications as directed. Please refer to the Current Medication list given to you today.  Labwork: NONE  Testing/Procedures: NONE  Follow-Up: Your physician wants you to follow-up as needed.  If you need a refill on your cardiac medications before your next appointment, please call your pharmacy.

## 2015-08-25 NOTE — Progress Notes (Signed)
Cardiology Office Note   Date:  08/25/2015   ID:  EMARY ZALAR, DOB [birth year], MRN [id number]  PCP:  Henrine Screws, MD  Cardiologist:  Sinclair Grooms, MD   Chief Complaint  Patient presents with  . Pre-op Exam      History of Present Illness: [name] is a 75 y.o. female who presents for preoperative cardiovascular clearance.  [name] is very pleasant 75 year old who has no history of cardiovascular disease but is asked to come for cardiovascular clearance. In 2002 she underwent coronary angiography and had an essentially normal vessels at that time. She was having atypical chest pain as the source and had a false positive cardial perfusion study. She is totally asymptomatic with reference to chest pain, dyspnea, palpitations, edema, orthopnea, and other cardiac symptoms.  Risk factors include diabetes, hyperlipidemia, and hypertension. Both parents lived to be greater than 1 years of age.  Past Medical History  Diagnosis Date  . Hyperlipidemia   . 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 Diabetes mellitus ORAL AND INSULIN MEDS  . Heart palpitations SINCE 2008 TAKES BETA BLOCKER  . PONV (postoperative nausea and vomiting)   . B12 deficiency anemia   . Hypertension   . Neuropathy, diabetic ([place]) RIGHT FOOT MILD NUMBNESS  . Fibrosis of knee joint RIGHT    Past Surgical History  Procedure Laterality Date  . Knee arthrotomy  04-03-2011    RIGHT KNEE/ SCAR EXCISION/ POLYTHYLENE EXCHANGE  . Exchange left breast implant/ capsulotomy/ right mastopexy  11-28-2008  . Total left mastectomy  09-29-2007  . Breast lumpectomy  07-27-2007    LEFT   . Manipulation knee joint  04-02-2006    CLOSED --  RIGHT   . Total knee arthroplasty  02-03-2006    RIGHT  . Cardiac catheterization  2001    MIMINAL PLAQUE RCA, CIRCUMFLEX, LAD;   NORMAL LVF  . Knee arthroscopy   1999    RIGHT  . Total abdominal hysterectomy w/ bilateral salpingoophorectomy  1989    AND APPENDECTOMY  . Left breast bx ;  x2  1976  . Tubal ligation  1976  . Urethral dilation  1976  . Cervical fusion  1996    C3 - 5  . Knee closed reduction  07/31/2011    Procedure: CLOSED MANIPULATION KNEE;  Surgeon: [name], MD;  Location: [facility];  Service: Orthopedics;  Laterality: Right;  . Abdominal hysterectomy    . Esophagogastroduodenoscopy (egd) with propofol N/A 06/12/2015    Procedure: ESOPHAGOGASTRODUODENOSCOPY (EGD) WITH PROPOFOL;  Surgeon: [name], MD;  Location: [facility];  Service: Endoscopy;  Laterality: N/A;  . Balloon dilation N/A 06/12/2015    Procedure: BALLOON DILATION;  Surgeon: [name], MD;  Location: [facility];  Service: Endoscopy;  Laterality: N/A;     Current Outpatient Prescriptions  Medication Sig Dispense Refill  . aspirin 81 MG tablet Take 81 mg by mouth daily.     . cetirizine (ZYRTEC) 10 MG tablet Take 10 mg by mouth daily.    . Cholecalciferol (VITAMIN D3) 2000 UNITS TABS Take 2,000 Units by mouth daily.    . Cyanocobalamin (VITAMIN B-12 IJ) Inject as directed every 30 (thirty) days.    Marland Kitchen estradiol (ESTRACE) 0.5 MG tablet Take 0.5 mg by mouth daily.     . furosemide (LASIX)  40 MG tablet Take 20 mg by mouth daily.    . insulin glargine (LANTUS) 100 UNIT/ML injection Inject 40 Units into the skin daily.     Marland Kitchen JANUMET 50-500 MG tablet Take 1 tablet by mouth 2 (two) times daily.    Marland Kitchen lisinopril (PRINIVIL,ZESTRIL) 5 MG tablet Take 5 mg by mouth daily.    . metoprolol succinate (TOPROL-XL) 50 MG 24 hr tablet Take 50 mg by mouth daily. Take with or immediately following a meal.    . rosuvastatin (CRESTOR) 10 MG tablet Take 10 mg by mouth daily.    Marland Kitchen tobramycin-dexamethasone (TOBRADEX) ophthalmic solution Place 1 drop into the left eye daily as needed. (redness)     No current facility-administered medications for this  visit.    Allergies:   Contrast media; Epinephrine; and Sulfa antibiotics    Social History:  The patient  reports that she has never smoked. She has never used smokeless tobacco. She reports that she does not drink alcohol or use illicit drugs.   Family History:  The patient's family history includes Cancer in her brother and sister; Heart disease in her mother; Stroke in her father.    ROS:  Please see the history of present illness.   Otherwise, review of systems are positive for mentation of physical activity due to bilateral knee problems. She is preparing for left total knee replacement. Right knee is immobile. Decreased hearing is also a complaint..   All other systems are reviewed and negative.    PHYSICAL EXAM: VS:  BP 120/74 mmHg  Pulse 79  Ht '5\' 7"'$  (1.702 m)  Wt 170 lb 12.8 oz (77.474 kg)  BMI 26.74 kg/m2 , BMI Body mass index is 26.74 kg/(m^2). GEN: Well nourished, well developed, in no acute distress HEENT: normal Neck: no JVD, carotid bruits, or masses Cardiac: RRR.  There is no murmur, rub, or gallop. There is no edema. Respiratory:  clear to auscultation bilaterally, normal work of breathing. GI: soft, nontender, nondistended, + BS MS: no deformity or atrophy Skin: warm and dry, no rash Neuro:  Strength and sensation are intact Psych: euthymic mood, full affect   EKG:  EKG is ordered today. The ekg reveals normal sinus rhythm and low voltage. No acute changes are noted.  Recent Labs: No results found for requested labs within last 365 days.    Lipid Panel No results found for: CHOL, TRIG, HDL, CHOLHDL, VLDL, LDLCALC, LDLDIRECT    Wt Readings from Last 3 Encounters:  08/25/15 170 lb 12.8 oz (77.474 kg)  06/12/15 164 lb (74.39 kg)  01/28/13 178 lb 9.6 oz (81.012 kg)      Other studies Reviewed: Additional studies/ records that were reviewed today include: At cardiac catheterization in 2002. The findings include  CONCLUSIONS: 1. Essentially normal  coronary arteries for age. Minimal plaquing is noted in  the LAD, circumflex and right coronary. No gradable lesions are present. 2. Normal left ventricular function..    ASSESSMENT AND PLAN:  1. Pre-operative cardiovascular examination There are no cardiovascular complaints. No specific workup is required. Risk for knee replacement and rehabilitation should be low.  The patient is cleared for the upcoming surgical procedure.  2. Hyperlipidemia  followed by primary care and treated with Crestor.  3. Type 2 diabetes mellitus with complications  the most recent A1c was less than 6.0  4. Essential hypertension  Well-controlled  5. History of left breast osteosarcoma 2009  there is no history of recurrence   Current medicines are  reviewed at length with the patient today.  The patient has the following concerns regarding medicines:  None   The following changes/actions have been instituted:     Cleared for upcoming orthopedic surgery by Dr. Wynelle Link  No cardiovascular workup is required. Risks should be low.    Labs/ tests ordered today include:  No orders of the defined types were placed in this encounter.     Disposition:   FU with HS in prn   Signed, Sinclair Grooms, MD  08/25/2015 3:32 PM    [facility] [place], [place], [place]  [address] Phone: [phone]; Fax: [phone]

## 2015-10-05 ENCOUNTER — Ambulatory Visit: Payer: Self-pay | Admitting: Orthopedic Surgery

## 2015-10-05 NOTE — H&P (Signed)
[name] DOB: Mar 13, 1941 Married / Language: English / Race: White Female Date of Admission:  10/23/2015 CC:  Left knee pain History of Present Illness The patient is a 75 year old female who comes in for a preoperative History and Physical. The patient is scheduled for a left total knee arthroplasty to be performed by Dr. Dione Plover. Aluisio, MD at [facility] on 10-23-2015. The patient is a 75 year old female who presents today for follow up of their knee. The patient is being followed for their left knee pain and osteoarthritis. They are out from a cortisone injection. Symptoms reported today include: pain, instability and difficulty ambulating (worse with going down steps). The patient feels that they are doing poorly and report their pain level to be moderate to severe. The following medication has been used for pain control: antiinflammatory medication in the form of ibuprofen. Pt. state's the cortisone injection helped in the past but only temporary. She has had several injections in the past. Viscosupplementation has not been effective. She says when it wears off, she is in significant pain. She had an injection several weeks ago, and obviously is having pain again. She is now a t a point where she would like to get the knee fixed at this time. They have been treated conservatively in the past for the above stated problem and despite conservative measures, they continue to have progressive pain and severe functional limitations and dysfunction. They have failed non-operative management including home exercise, medications, and injections. It is felt that they would benefit from undergoing total joint replacement. Risks and benefits of the procedure have been discussed with the patient and they elect to proceed with surgery. There are no active contraindications to surgery such as ongoing infection or rapidly progressive neurological disease.  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 Hypertension  mother and sister Rheumatoid Arthritis  father  Social History Tobacco use  Never smoker. 11/11/2014 No alcohol use  Alcohol use  never consumed alcohol Children  2 Current work status  retired Engineer, agricultural (Currently)  no Exercise  Exercises rarely Illicit drug use  no Living situation  live with spouse Marital status  married Never consumed alcohol  11/11/2014: Never consumed alcohol No history of drug/alcohol rehab  Not under pain contract  Number of flights of stairs before winded  2-3 Pain Contract  no Previously in rehab  no Tobacco / smoke exposure  11/11/2014: no Post-Surgical Plans  HOME  Medication History  Janumet (50-'1000MG'$  Tablet, Oral) Active. Furosemide ('40MG'$  Tablet, Oral) Active. Lantus (100UNIT/ML Solution, Subcutaneous) Active. Toprol XL ('50MG'$  Tablet ER 24HR, Oral) Active. Estradiol (0.'5MG'$  Tablet, Oral) Active. Aspirin ('81MG'$  Tablet Chewable, Oral) Active. Vitamin D3 (2000UNIT Tablet Chewable, Oral) Active.   Past Surgical History Mastectomy - Bilateral  left Mammoplasty; Reduction  right Hysterectomy  complete (non-cancerous) Total Knee Replacement  Date: 01/2006. right Spinal Fusion  neck Neck Disc Surgery  Breast Biopsy  left Arthroscopy of Knee   right Appendectomy  Cataract Surgery  bilateral Breast Reconstruction  left Breast Mass; Local Excision  left Revision Right Total Knee  Oophorectomy  bilateral  Review of Systems General Not Present- Chills, Fatigue, Fever, Memory Loss, Night Sweats, Weight Gain and Weight Loss. Skin Not Present- Eczema, Hives, Itching, Lesions and Rash. HEENT Not Present- Dentures, Double Vision, Headache, Hearing Loss, Tinnitus and Visual Loss. Respiratory Not Present- Allergies, Chronic Cough, Coughing up blood, Shortness of breath at rest and Shortness of breath with exertion. Cardiovascular Not Present- Chest Pain, Difficulty Breathing Lying Down, Murmur, Palpitations, Racing/skipping heartbeats and Swelling. Gastrointestinal Not Present- Abdominal Pain, Bloody Stool, Constipation, Diarrhea, Difficulty Swallowing, Heartburn, Jaundice, Loss of appetitie, Nausea and Vomiting. Female Genitourinary Not Present- Blood in Urine, Discharge, Flank Pain, Incontinence, Painful Urination, Urgency, Urinary frequency, Urinary Retention, Urinating at Night and Weak urinary stream. Musculoskeletal Present- Joint Pain and Morning Stiffness. Not Present- Back Pain, Joint Swelling, Muscle Pain, Muscle Weakness and Spasms. Neurological Not Present- Blackout spells, Difficulty with balance, Dizziness, Paralysis, Tremor and Weakness. Psychiatric Not Present- Insomnia.  Vitals  Weight: 169 lb Height: 67in Body Surface Area: 1.88 m Body Mass Index: 26.47 kg/m  Pulse: 64 (Regular)  BP: 138/70 (Sitting, Right Arm, Standard) AVOID LEFT ARM FOR IV'S AND BP'S  Physical Exam  General Mental Status -Alert, cooperative and good historian. General Appearance-pleasant, Not in acute distress. Orientation-Oriented X3. Build & Nutrition-Well nourished and Well developed.  Head and Neck Head-normocephalic, atraumatic . Neck Global Assessment - supple, no bruit auscultated on the right, no bruit  auscultated on the left.  Eye Vision-Wears corrective lenses. Pupil - Bilateral-Regular and Round. Motion - Bilateral-EOMI.  ENMT Note: Partial Lower Denture Plate  Chest and Lung Exam Auscultation Breath sounds - clear at anterior chest wall and clear at posterior chest wall. Adventitious sounds - No Adventitious sounds.  Cardiovascular Auscultation Rhythm - Regular rate and rhythm. Heart Sounds - S1 WNL and S2 WNL. Murmurs & Other Heart Sounds - Auscultation of the heart reveals - No Murmurs.  Abdomen Palpation/Percussion Tenderness - Abdomen is non-tender to palpation. Rigidity (guarding) - Abdomen is soft. Auscultation Auscultation of the abdomen reveals - Bowel sounds normal.  Female Genitourinary Note: Not done, not pertinent to present illness  Musculoskeletal Note: She is in no distress. Left knee, no effusion. No marked crepitus on range of motion, range about 10 to 120. There is no instability. She is tender, medial greater than lateral.  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 Inda Merlin - Patient has been seen preoperatively and felt to be stable for surgery. Cards: Dr. Tamala Julian - Patient has been seen preoperatively and felt to be stable for surgery.  Topical TXA - Breast Cancer  Anesthesia Issues: None exccept for nausea one time.  Signed electronically by Ok Edwards, III PA-C

## 2015-10-10 ENCOUNTER — Ambulatory Visit: Payer: Self-pay | Admitting: Orthopedic Surgery

## 2015-10-10 NOTE — Progress Notes (Signed)
Preoperative surgical orders have been place into the Epic hospital system for [name] on 10/10/2015, 9:50 AM  by Mickel Crow for surgery on 10-23-15.  Preop Total Knee orders including Experal, IV Tylenol, and IV Decadron as long as there are no contraindications to the above medications. Arlee Muslim, PA-C

## 2015-10-16 ENCOUNTER — Encounter (HOSPITAL_COMMUNITY)
Admission: RE | Admit: 2015-10-16 | Discharge: 2015-10-16 | Disposition: A | Discharge status: Home / Self Care | Payer: Medicare Other | Source: Ambulatory Visit | Attending: Orthopedic Surgery | Admitting: Orthopedic Surgery

## 2015-10-16 ENCOUNTER — Encounter (HOSPITAL_COMMUNITY): Payer: Self-pay

## 2015-10-16 DIAGNOSIS — Z01812 Encounter for preprocedural laboratory examination: Secondary | ICD-10-CM | POA: Diagnosis not present

## 2015-10-16 DIAGNOSIS — Z96651 Presence of right artificial knee joint: Secondary | ICD-10-CM | POA: Insufficient documentation

## 2015-10-16 DIAGNOSIS — M1712 Unilateral primary osteoarthritis, left knee: Secondary | ICD-10-CM | POA: Diagnosis not present

## 2015-10-16 HISTORY — DX: Unspecified osteoarthritis, unspecified site: M19.90

## 2015-10-16 LAB — COMPREHENSIVE METABOLIC PANEL
ALT: 22 U/L (ref 14–54)
AST: 21 U/L (ref 15–41)
Albumin: 3.7 g/dL (ref 3.5–5.0)
Alkaline Phosphatase: 43 U/L (ref 38–126)
Anion gap: 7 (ref 5–15)
BUN: 18 mg/dL (ref 6–20)
CO2: 27 mmol/L (ref 22–32)
Calcium: 9.3 mg/dL (ref 8.9–10.3)
Chloride: 105 mmol/L (ref 101–111)
Creatinine, Ser: 0.73 mg/dL (ref 0.44–1.00)
GFR calc Af Amer: >60 mL/min (ref >60)
GFR calc non Af Amer: >60 mL/min (ref >60)
Glucose, Bld: 93 mg/dL (ref 65–99)
Potassium: 4.4 mmol/L (ref 3.5–5.1)
Sodium: 139 mmol/L (ref 135–145)
Total Bilirubin: 0.8 mg/dL (ref 0.3–1.2)
Total Protein: 6.5 g/dL (ref 6.5–8.1)

## 2015-10-16 LAB — CBC
HCT: 38.8 % (ref 36.0–46.0)
Hemoglobin: 12.6 g/dL (ref 12.0–15.0)
MCH: 29.2 pg (ref 26.0–34.0)
MCHC: 32.5 g/dL (ref 30.0–36.0)
MCV: 90 fL (ref 78.0–100.0)
Platelets: 285 10*3/uL (ref 150–400)
RBC: 4.31 MIL/uL (ref 3.87–5.11)
RDW: 13.8 % (ref 11.5–15.5)
WBC: 7.3 10*3/uL (ref 4.0–10.5)

## 2015-10-16 LAB — URINALYSIS, ROUTINE W REFLEX MICROSCOPIC
Bilirubin Urine: NEGATIVE
Glucose, UA: NEGATIVE mg/dL
Hgb urine dipstick: NEGATIVE
Ketones, ur: NEGATIVE mg/dL
Leukocytes, UA: NEGATIVE
Nitrite: NEGATIVE
Protein, ur: NEGATIVE mg/dL
Specific Gravity, Urine: 1.011 (ref 1.005–1.030)
pH: 5 (ref 5.0–8.0)

## 2015-10-16 LAB — PROTIME-INR
INR: 1.02 (ref 0.00–1.49)
Prothrombin Time: 13.2 seconds (ref 11.6–15.2)

## 2015-10-16 LAB — APTT: aPTT: 29 seconds (ref 24–37)

## 2015-10-16 LAB — SURGICAL PCR SCREEN
MRSA, PCR: NEGATIVE
Staphylococcus aureus: NEGATIVE

## 2015-10-16 NOTE — Patient Instructions (Addendum)
[name]  10/16/2015   Your procedure is scheduled on: Monday 10/23/2015  Report to [facility] Main  Entrance take [place]  elevators to 3rd floor to  [place] at  [place] AM.  Call this number if you have problems the morning of surgery [phone]   Remember: ONLY 1 PERSON MAY GO WITH YOU TO SHORT STAY TO GET  READY MORNING OF [place].   Do not eat food or drink liquids :After Midnight.     Take these medicines the morning of surgery with A SIP OF WATER: Metoprolol              DO NOT TAKE ANY DIABETIC MEDICATIONS DAY OF YOUR SURGERY!                               You may not have any metal on your body including hair pins and              piercings  Do not wear jewelry, make-up, lotions, powders or perfumes, deodorant             Do not wear nail polish.  Do not shave  48 hours prior to surgery.              Men may shave face and neck.   Do not bring valuables to the hospital. [place].  Contacts, dentures or bridgework may not be worn into surgery.  Leave suitcase in the car. After surgery it may be brought to your room.                  Please read over the following fact sheets you were given: _____________________________________________________________________             [facility] - Preparing for Surgery Before surgery, you can play an important role.  Because skin is not sterile, your skin needs to be as free of germs as possible.  You can reduce the number of germs on your skin by washing with CHG (chlorahexidine gluconate) soap before surgery.  CHG is an antiseptic cleaner which kills germs and bonds with the skin to continue killing germs even after washing. Please DO NOT use if you have an allergy to CHG or antibacterial soaps.  If your skin becomes reddened/irritated stop using the CHG and inform your nurse when you arrive at Short Stay. Do not shave (including legs and  underarms) for at least 48 hours prior to the first CHG shower.  You may shave your face/neck. Please follow these instructions carefully:  1.  Shower with CHG Soap the night before surgery and the  morning of Surgery.  2.  If you choose to wash your hair, wash your hair first as usual with your  normal  shampoo.  3.  After you shampoo, rinse your hair and body thoroughly to remove the  shampoo.                           4.  Use CHG as you would any other liquid soap.  You can apply chg directly  to the skin and wash  Gently with a scrungie or clean washcloth.  5.  Apply the CHG Soap to your body ONLY FROM THE NECK DOWN.   Do not use on face/ open                           Wound or open sores. Avoid contact with eyes, ears mouth and genitals (private parts).                       Wash face,  Genitals (private parts) with your normal soap.             6.  Wash thoroughly, paying special attention to the area where your surgery  will be performed.  7.  Thoroughly rinse your body with warm water from the neck down.  8.  DO NOT shower/wash with your normal soap after using and rinsing off  the CHG Soap.                9.  Pat yourself dry with a clean towel.            10.  Wear clean pajamas.            11.  Place clean sheets on your bed the night of your first shower and do not  sleep with pets. Day of Surgery : Do not apply any lotions/deodorants the morning of surgery.  Please wear clean clothes to the hospital/surgery center.  FAILURE TO FOLLOW THESE INSTRUCTIONS MAY RESULT IN THE CANCELLATION OF YOUR SURGERY PATIENT SIGNATURE_________________________________  NURSE SIGNATURE__________________________________  ________________________________________________________________________   Adam Phenix  An incentive spirometer is a tool that can help keep your lungs clear and active. This tool measures how well you are filling your lungs with each breath. Taking  long deep breaths may help reverse or decrease the chance of developing breathing (pulmonary) problems (especially infection) following:  A long period of time when you are unable to move or be active. BEFORE THE PROCEDURE   If the spirometer includes an indicator to show your best effort, your nurse or respiratory therapist will set it to a desired goal.  If possible, sit up straight or lean slightly forward. Try not to slouch.  Hold the incentive spirometer in an upright position. INSTRUCTIONS FOR USE   Sit on the edge of your bed if possible, or sit up as far as you can in bed or on a chair.  Hold the incentive spirometer in an upright position.  Breathe out normally.  Place the mouthpiece in your mouth and seal your lips tightly around it.  Breathe in slowly and as deeply as possible, raising the piston or the ball toward the top of the column.  Hold your breath for 3-5 seconds or for as long as possible. Allow the piston or ball to fall to the bottom of the column.  Remove the mouthpiece from your mouth and breathe out normally.  Rest for a few seconds and repeat Steps 1 through 7 at least 10 times every 1-2 hours when you are awake. Take your time and take a few normal breaths between deep breaths.  The spirometer may include an indicator to show your best effort. Use the indicator as a goal to work toward during each repetition.  After each set of 10 deep breaths, practice coughing to be sure your lungs are clear. If you have an incision (the cut made at the time of surgery),  support your incision when coughing by placing a pillow or rolled up towels firmly against it. Once you are able to get out of bed, walk around indoors and cough well. You may stop using the incentive spirometer when instructed by your caregiver.  RISKS AND COMPLICATIONS  Take your time so you do not get dizzy or light-headed.  If you are in pain, you may need to take or ask for pain medication before  doing incentive spirometry. It is harder to take a deep breath if you are having pain. AFTER USE  Rest and breathe slowly and easily.  It can be helpful to keep track of a log of your progress. Your caregiver can provide you with a simple table to help with this. If you are using the spirometer at home, follow these instructions: [place] IF:   You are having difficultly using the spirometer.  You have trouble using the spirometer as often as instructed.  Your pain medication is not giving enough relief while using the spirometer.  You develop fever of 100.5 F (38.1 C) or higher. SEEK IMMEDIATE MEDICAL CARE IF:   You cough up bloody sputum that had not been present before.  You develop fever of 102 F (38.9 C) or greater.  You develop worsening pain at or near the incision site. MAKE SURE YOU:   Understand these instructions.  Will watch your condition.  Will get help right away if you are not doing well or get worse. Document Released: 11/04/2006 Document Revised: 09/16/2011 Document Reviewed: 01/05/2007 ExitCare Patient Information 2014 ExitCare, [place].   ________________________________________________________________________  WHAT IS A BLOOD TRANSFUSION? Blood Transfusion Information  A transfusion is the replacement of blood or some of its parts. Blood is made up of multiple cells which provide different functions.  Red blood cells carry oxygen and are used for blood loss replacement.  White blood cells fight against infection.  Platelets control bleeding.  Plasma helps clot blood.  Other blood products are available for specialized needs, such as hemophilia or other clotting disorders. BEFORE THE TRANSFUSION  Who gives blood for transfusions?   Healthy volunteers who are fully evaluated to make sure their blood is safe. This is blood bank blood. Transfusion therapy is the safest it has ever been in the practice of medicine. Before blood is taken  from a donor, a complete history is taken to make sure that person has no history of diseases nor engages in risky social behavior (examples are intravenous drug use or sexual activity with multiple partners). The donor's travel history is screened to minimize risk of transmitting infections, such as malaria. The donated blood is tested for signs of infectious diseases, such as HIV and hepatitis. The blood is then tested to be sure it is compatible with you in order to minimize the chance of a transfusion reaction. If you or a relative donates blood, this is often done in anticipation of surgery and is not appropriate for emergency situations. It takes many days to process the donated blood. RISKS AND COMPLICATIONS Although transfusion therapy is very safe and saves many lives, the main dangers of transfusion include:   Getting an infectious disease.  Developing a transfusion reaction. This is an allergic reaction to something in the blood you were given. Every precaution is taken to prevent this. The decision to have a blood transfusion has been considered carefully by your caregiver before blood is given. Blood is not given unless the benefits outweigh the risks. AFTER THE TRANSFUSION  Right after receiving a blood transfusion, you will usually feel much better and more energetic. This is especially true if your red blood cells have gotten low (anemic). The transfusion raises the level of the red blood cells which carry oxygen, and this usually causes an energy increase.  The nurse administering the transfusion will monitor you carefully for complications. HOME CARE INSTRUCTIONS  No special instructions are needed after a transfusion. You may find your energy is better. Speak with your caregiver about any limitations on activity for underlying diseases you may have. SEEK MEDICAL CARE IF:   Your condition is not improving after your transfusion.  You develop redness or irritation at the  intravenous (IV) site. SEEK IMMEDIATE MEDICAL CARE IF:  Any of the following symptoms occur over the next 12 hours:  Shaking chills.  You have a temperature by mouth above 102 F (38.9 C), not controlled by medicine.  Chest, back, or muscle pain.  People around you feel you are not acting correctly or are confused.  Shortness of breath or difficulty breathing.  Dizziness and fainting.  You get a rash or develop hives.  You have a decrease in urine output.  Your urine turns a dark color or changes to pink, red, or brown. Any of the following symptoms occur over the next 10 days:  You have a temperature by mouth above 102 F (38.9 C), not controlled by medicine.  Shortness of breath.  Weakness after normal activity.  The white part of the eye turns yellow (jaundice).  You have a decrease in the amount of urine or are urinating less often.  Your urine turns a dark color or changes to pink, red, or brown. Document Released: 06/21/2000 Document Revised: 09/16/2011 Document Reviewed: 02/08/2008 [facility] Patient Information 2014 [place], [place].  _______________________________________________________________________

## 2015-10-17 LAB — HEMOGLOBIN A1C
Hgb A1c MFr Bld: 7.1 % — ABNORMAL HIGH (ref 4.8–5.6)
Mean Plasma Glucose: 157 mg/dL

## 2015-10-22 NOTE — Anesthesia Preprocedure Evaluation (Addendum)
Anesthesia Evaluation  Patient identified by MRN, date of birth, ID band Patient awake    Reviewed: Allergy & Precautions, NPO status , Patient's Chart, lab work & pertinent test results  History of Anesthesia Complications (+) PONV and history of anesthetic complications  Airway Mallampati: I  TM Distance: >3 FB Neck ROM: Full    Dental  (+) Teeth Intact, Caps, Dental Advisory Given,    Pulmonary neg pulmonary ROS,    breath sounds clear to auscultation       Cardiovascular hypertension,  Rhythm:Regular Rate:Normal     Neuro/Psych Hx of cervical fusion negative neurological ROS     GI/Hepatic   Endo/Other  diabetes  Renal/GU      Musculoskeletal  (+) Arthritis ,   Abdominal   Peds  Hematology   Anesthesia Other Findings   Reproductive/Obstetrics                           Anesthesia Physical Anesthesia Plan  ASA: III  Anesthesia Plan: Spinal   Post-op Pain Management:    Induction: Intravenous  Airway Management Planned: Simple Face Mask  Additional Equipment:   Intra-op Plan:   Post-operative Plan:   Informed Consent: I have reviewed the patients History and Physical, chart, labs and discussed the procedure including the risks, benefits and alternatives for the proposed anesthesia with the patient or authorized representative who has indicated his/her understanding and acceptance.     Plan Discussed with: CRNA and Surgeon  Anesthesia Plan Comments:        Anesthesia Quick Evaluation

## 2015-10-23 ENCOUNTER — Inpatient Hospital Stay (HOSPITAL_COMMUNITY): Payer: Medicare Other | Admitting: Anesthesiology

## 2015-10-23 ENCOUNTER — Encounter (HOSPITAL_COMMUNITY): Payer: Self-pay | Admitting: *Deleted

## 2015-10-23 ENCOUNTER — Encounter (HOSPITAL_COMMUNITY)
Admission: RE | Disposition: A | Discharge status: Home / Self Care | Payer: Self-pay | Source: Ambulatory Visit | Attending: Orthopedic Surgery

## 2015-10-23 ENCOUNTER — Inpatient Hospital Stay (HOSPITAL_COMMUNITY)
Admission: RE | Admit: 2015-10-23 | Discharge: 2015-10-25 | DRG: 470 | Disposition: A | Discharge status: Home / Self Care | Payer: Medicare Other | Source: Ambulatory Visit | Attending: Orthopedic Surgery | Admitting: Orthopedic Surgery

## 2015-10-23 DIAGNOSIS — E1140 Type 2 diabetes mellitus with diabetic neuropathy, unspecified: Secondary | ICD-10-CM | POA: Diagnosis not present

## 2015-10-23 DIAGNOSIS — M1712 Unilateral primary osteoarthritis, left knee: Principal | ICD-10-CM | POA: Diagnosis present

## 2015-10-23 DIAGNOSIS — Z7982 Long term (current) use of aspirin: Secondary | ICD-10-CM | POA: Diagnosis not present

## 2015-10-23 DIAGNOSIS — Z96651 Presence of right artificial knee joint: Secondary | ICD-10-CM | POA: Diagnosis present

## 2015-10-23 DIAGNOSIS — Z79899 Other long term (current) drug therapy: Secondary | ICD-10-CM | POA: Diagnosis not present

## 2015-10-23 DIAGNOSIS — Z981 Arthrodesis status: Secondary | ICD-10-CM | POA: Diagnosis not present

## 2015-10-23 DIAGNOSIS — Z01812 Encounter for preprocedural laboratory examination: Secondary | ICD-10-CM

## 2015-10-23 DIAGNOSIS — Z853 Personal history of malignant neoplasm of breast: Secondary | ICD-10-CM

## 2015-10-23 DIAGNOSIS — M179 Osteoarthritis of knee, unspecified: Secondary | ICD-10-CM | POA: Diagnosis present

## 2015-10-23 DIAGNOSIS — I10 Essential (primary) hypertension: Secondary | ICD-10-CM | POA: Diagnosis present

## 2015-10-23 DIAGNOSIS — M25562 Pain in left knee: Secondary | ICD-10-CM | POA: Diagnosis present

## 2015-10-23 DIAGNOSIS — M1710 Unilateral primary osteoarthritis, unspecified knee: Secondary | ICD-10-CM | POA: Diagnosis present

## 2015-10-23 HISTORY — PX: TOTAL KNEE ARTHROPLASTY: SHX125

## 2015-10-23 LAB — TYPE AND SCREEN
ABO/RH(D): A POS
Antibody Screen: NEGATIVE

## 2015-10-23 LAB — GLUCOSE, CAPILLARY
Glucose-Capillary: 162 mg/dL — ABNORMAL HIGH (ref 65–99)
Glucose-Capillary: 198 mg/dL — ABNORMAL HIGH (ref 65–99)
Glucose-Capillary: 280 mg/dL — ABNORMAL HIGH (ref 65–99)

## 2015-10-23 SURGERY — ARTHROPLASTY, KNEE, TOTAL
Anesthesia: Spinal | Site: Knee | Laterality: Left

## 2015-10-23 MED ORDER — PHENYLEPHRINE HCL 10 MG/ML IJ SOLN
INTRAMUSCULAR | Status: DC | PRN
  Administered 2015-10-23: 40 ug via INTRAVENOUS
  Administered 2015-10-23: 80 ug via INTRAVENOUS

## 2015-10-23 MED ORDER — FLEET ENEMA 7-19 GM/118ML RE ENEM: 1.0000 | ENEMA | Freq: Once | RECTAL | Status: DC | PRN

## 2015-10-23 MED ORDER — RIVAROXABAN 10 MG PO TABS
10.0000 mg | ORAL_TABLET | Freq: Every day | ORAL | Status: DC
  Administered 2015-10-24 – 2015-10-25 (×2): 10 mg via ORAL
  Filled 2015-10-23 (×3): qty 1

## 2015-10-23 MED ORDER — BUPIVACAINE HCL (PF) 0.25 % IJ SOLN
INTRAMUSCULAR | Status: AC
Start: 2015-10-23 — End: 2015-10-23
  Filled 2015-10-23: qty 30

## 2015-10-23 MED ORDER — MENTHOL 3 MG MT LOZG: 1.0000 | LOZENGE | OROMUCOSAL | Status: DC | PRN

## 2015-10-23 MED ORDER — PROPOFOL 10 MG/ML IV BOLUS
INTRAVENOUS | Status: AC
  Filled 2015-10-23: qty 60

## 2015-10-23 MED ORDER — METOCLOPRAMIDE HCL 5 MG/ML IJ SOLN: 5.0000 mg | Freq: Three times a day (TID) | INTRAMUSCULAR | Status: DC | PRN

## 2015-10-23 MED ORDER — FUROSEMIDE 20 MG PO TABS
20.0000 mg | ORAL_TABLET | Freq: Every day | ORAL | Status: DC
  Administered 2015-10-24 – 2015-10-25 (×2): 20 mg via ORAL
  Filled 2015-10-23 (×2): qty 1

## 2015-10-23 MED ORDER — ONDANSETRON HCL 4 MG/2ML IJ SOLN: 4.0000 mg | Freq: Four times a day (QID) | INTRAMUSCULAR | Status: DC | PRN

## 2015-10-23 MED ORDER — ACETAMINOPHEN 325 MG PO TABS
650.0000 mg | ORAL_TABLET | Freq: Four times a day (QID) | ORAL | Status: DC | PRN
Start: 2015-10-24 — End: 2015-10-25
  Administered 2015-10-24 – 2015-10-25 (×4): 650 mg via ORAL
  Filled 2015-10-23 (×4): qty 2

## 2015-10-23 MED ORDER — LINAGLIPTIN 5 MG PO TABS
5.0000 mg | ORAL_TABLET | Freq: Every day | ORAL | Status: DC
  Administered 2015-10-24 – 2015-10-25 (×2): 5 mg via ORAL
  Filled 2015-10-23 (×3): qty 1

## 2015-10-23 MED ORDER — METOCLOPRAMIDE HCL 10 MG PO TABS
5.0000 mg | ORAL_TABLET | Freq: Three times a day (TID) | ORAL | Status: DC | PRN
Start: 2015-10-23 — End: 2015-10-25

## 2015-10-23 MED ORDER — METHOCARBAMOL 1000 MG/10ML IJ SOLN
500.0000 mg | Freq: Four times a day (QID) | INTRAVENOUS | Status: DC | PRN
  Administered 2015-10-23: 500 mg via INTRAVENOUS
  Filled 2015-10-23 (×2): qty 5

## 2015-10-23 MED ORDER — DEXAMETHASONE SODIUM PHOSPHATE 10 MG/ML IJ SOLN
10.0000 mg | Freq: Once | INTRAMUSCULAR | Status: AC
  Administered 2015-10-24: 10 mg via INTRAVENOUS
  Filled 2015-10-23: qty 1

## 2015-10-23 MED ORDER — METHOCARBAMOL 500 MG PO TABS
500.0000 mg | ORAL_TABLET | Freq: Four times a day (QID) | ORAL | Status: DC | PRN
  Administered 2015-10-23 – 2015-10-25 (×6): 500 mg via ORAL
  Filled 2015-10-23 (×6): qty 1

## 2015-10-23 MED ORDER — METFORMIN HCL 500 MG PO TABS
500.0000 mg | ORAL_TABLET | Freq: Two times a day (BID) | ORAL | Status: DC
  Filled 2015-10-23 (×5): qty 1

## 2015-10-23 MED ORDER — PHENOL 1.4 % MT LIQD: 1.0000 | OROMUCOSAL | Status: DC | PRN

## 2015-10-23 MED ORDER — LACTATED RINGERS IV SOLN
INTRAVENOUS | Status: DC | PRN
  Administered 2015-10-23 (×2): via INTRAVENOUS

## 2015-10-23 MED ORDER — LORATADINE 10 MG PO TABS
10.0000 mg | ORAL_TABLET | Freq: Every evening | ORAL | Status: DC
  Administered 2015-10-23 – 2015-10-24 (×2): 10 mg via ORAL
  Filled 2015-10-23 (×3): qty 1

## 2015-10-23 MED ORDER — BUPIVACAINE LIPOSOME 1.3 % IJ SUSP
INTRAMUSCULAR | Status: DC | PRN
  Administered 2015-10-23: 20 mL

## 2015-10-23 MED ORDER — LIDOCAINE HCL (CARDIAC) 20 MG/ML IV SOLN
INTRAVENOUS | Status: DC | PRN
  Administered 2015-10-23: 50 mg via INTRAVENOUS

## 2015-10-23 MED ORDER — TRANEXAMIC ACID 1000 MG/10ML IV SOLN
2000.0000 mg | INTRAVENOUS | Status: DC | PRN
  Administered 2015-10-23: 2000 mg via TOPICAL

## 2015-10-23 MED ORDER — ONDANSETRON HCL 4 MG/2ML IJ SOLN: 4.0000 mg | Freq: Once | INTRAMUSCULAR | Status: DC

## 2015-10-23 MED ORDER — MORPHINE SULFATE (PF) 10 MG/ML IV SOLN: 1.0000 mg | INTRAVENOUS | Status: DC | PRN

## 2015-10-23 MED ORDER — TRANEXAMIC ACID 1000 MG/10ML IV SOLN
2000.0000 mg | Freq: Once | INTRAVENOUS | Status: DC
  Filled 2015-10-23: qty 20

## 2015-10-23 MED ORDER — ACETAMINOPHEN 10 MG/ML IV SOLN
INTRAVENOUS | Status: AC
Start: 2015-10-23 — End: 2015-10-23
  Filled 2015-10-23: qty 100

## 2015-10-23 MED ORDER — OXYCODONE HCL 5 MG PO TABS
5.0000 mg | ORAL_TABLET | ORAL | Status: DC | PRN
  Administered 2015-10-23 (×2): 5 mg via ORAL
  Administered 2015-10-23 (×3): 10 mg via ORAL
  Filled 2015-10-23: qty 1
  Filled 2015-10-23 (×3): qty 2
  Filled 2015-10-23: qty 1
  Filled 2015-10-23: qty 2

## 2015-10-23 MED ORDER — SODIUM CHLORIDE 0.9 % IJ SOLN
INTRAMUSCULAR | Status: DC | PRN
  Administered 2015-10-23: 30 mL

## 2015-10-23 MED ORDER — DIPHENHYDRAMINE HCL 12.5 MG/5ML PO ELIX
12.5000 mg | ORAL_SOLUTION | ORAL | Status: DC | PRN
  Administered 2015-10-24 (×2): 25 mg via ORAL
  Filled 2015-10-23 (×2): qty 10

## 2015-10-23 MED ORDER — TRAMADOL HCL 50 MG PO TABS
50.0000 mg | ORAL_TABLET | Freq: Four times a day (QID) | ORAL | Status: DC | PRN
  Administered 2015-10-24 – 2015-10-25 (×6): 100 mg via ORAL
  Filled 2015-10-23 (×6): qty 2

## 2015-10-23 MED ORDER — BUPIVACAINE HCL (PF) 0.25 % IJ SOLN
INTRAMUSCULAR | Status: AC
  Filled 2015-10-23: qty 30

## 2015-10-23 MED ORDER — ACETAMINOPHEN 500 MG PO TABS
1000.0000 mg | ORAL_TABLET | Freq: Four times a day (QID) | ORAL | Status: AC
  Administered 2015-10-23 – 2015-10-24 (×4): 1000 mg via ORAL
  Filled 2015-10-23 (×4): qty 2

## 2015-10-23 MED ORDER — DOCUSATE SODIUM 100 MG PO CAPS
100.0000 mg | ORAL_CAPSULE | Freq: Two times a day (BID) | ORAL | Status: DC
  Administered 2015-10-23 – 2015-10-25 (×4): 100 mg via ORAL

## 2015-10-23 MED ORDER — SODIUM CHLORIDE 0.9 % IJ SOLN
INTRAMUSCULAR | Status: AC
  Filled 2015-10-23: qty 50

## 2015-10-23 MED ORDER — CHLORHEXIDINE GLUCONATE 4 % EX LIQD: 60.0000 mL | Freq: Once | CUTANEOUS | Status: DC

## 2015-10-23 MED ORDER — LIDOCAINE HCL (CARDIAC) 20 MG/ML IV SOLN
INTRAVENOUS | Status: AC
  Filled 2015-10-23: qty 5

## 2015-10-23 MED ORDER — ACETAMINOPHEN 10 MG/ML IV SOLN
1000.0000 mg | Freq: Once | INTRAVENOUS | Status: AC
  Administered 2015-10-23: 1000 mg via INTRAVENOUS
  Filled 2015-10-23: qty 100

## 2015-10-23 MED ORDER — DEXAMETHASONE SODIUM PHOSPHATE 10 MG/ML IJ SOLN
10.0000 mg | Freq: Once | INTRAMUSCULAR | Status: AC
  Administered 2015-10-23: 10 mg via INTRAVENOUS

## 2015-10-23 MED ORDER — CEFAZOLIN SODIUM-DEXTROSE 2-4 GM/100ML-% IV SOLN
2.0000 g | INTRAVENOUS | Status: AC
  Administered 2015-10-23: 2 g via INTRAVENOUS
  Filled 2015-10-23: qty 100

## 2015-10-23 MED ORDER — INSULIN ASPART 100 UNIT/ML ~~LOC~~ SOLN
0.0000 [IU] | Freq: Three times a day (TID) | SUBCUTANEOUS | Status: DC
  Administered 2015-10-23: 3 [IU] via SUBCUTANEOUS
  Administered 2015-10-23 – 2015-10-24 (×2): 8 [IU] via SUBCUTANEOUS
  Administered 2015-10-24: 3 [IU] via SUBCUTANEOUS
  Administered 2015-10-24 – 2015-10-25 (×2): 5 [IU] via SUBCUTANEOUS

## 2015-10-23 MED ORDER — ONDANSETRON HCL 4 MG PO TABS: 4.0000 mg | ORAL_TABLET | Freq: Four times a day (QID) | ORAL | Status: DC | PRN

## 2015-10-23 MED ORDER — PROPOFOL 500 MG/50ML IV EMUL
INTRAVENOUS | Status: DC | PRN
  Administered 2015-10-23: 75 ug/kg/min via INTRAVENOUS

## 2015-10-23 MED ORDER — CEFAZOLIN SODIUM-DEXTROSE 2-4 GM/100ML-% IV SOLN
2.0000 g | Freq: Four times a day (QID) | INTRAVENOUS | Status: AC
  Administered 2015-10-23 (×2): 2 g via INTRAVENOUS
  Filled 2015-10-23 (×2): qty 100

## 2015-10-23 MED ORDER — ACETAMINOPHEN 650 MG RE SUPP: 650.0000 mg | Freq: Four times a day (QID) | RECTAL | Status: DC | PRN

## 2015-10-23 MED ORDER — SODIUM CHLORIDE 0.9 % IV SOLN: INTRAVENOUS | Status: DC

## 2015-10-23 MED ORDER — POLYETHYLENE GLYCOL 3350 17 G PO PACK: 17.0000 g | PACK | Freq: Every day | ORAL | Status: DC | PRN

## 2015-10-23 MED ORDER — ROSUVASTATIN CALCIUM 10 MG PO TABS
10.0000 mg | ORAL_TABLET | Freq: Every evening | ORAL | Status: DC
  Administered 2015-10-23 – 2015-10-24 (×2): 10 mg via ORAL
  Filled 2015-10-23 (×3): qty 1

## 2015-10-23 MED ORDER — PHENYLEPHRINE 40 MCG/ML (10ML) SYRINGE FOR IV PUSH (FOR BLOOD PRESSURE SUPPORT)
PREFILLED_SYRINGE | INTRAVENOUS | Status: AC
  Filled 2015-10-23: qty 10

## 2015-10-23 MED ORDER — METOPROLOL SUCCINATE ER 50 MG PO TB24
50.0000 mg | ORAL_TABLET | Freq: Every evening | ORAL | Status: DC
  Administered 2015-10-23 – 2015-10-24 (×2): 50 mg via ORAL
  Filled 2015-10-23 (×3): qty 1

## 2015-10-23 MED ORDER — BUPIVACAINE IN DEXTROSE 0.75-8.25 % IT SOLN
INTRATHECAL | Status: DC | PRN
  Administered 2015-10-23: 12 mg via INTRATHECAL

## 2015-10-23 MED ORDER — BISACODYL 10 MG RE SUPP: 10.0000 mg | Freq: Every day | RECTAL | Status: DC | PRN

## 2015-10-23 MED ORDER — BUPIVACAINE LIPOSOME 1.3 % IJ SUSP
20.0000 mL | Freq: Once | INTRAMUSCULAR | Status: DC
  Filled 2015-10-23: qty 20

## 2015-10-23 MED ORDER — SITAGLIPTIN PHOS-METFORMIN HCL 50-500 MG PO TABS: 1.0000 | ORAL_TABLET | Freq: Two times a day (BID) | ORAL | Status: DC

## 2015-10-23 MED ORDER — DEXAMETHASONE SODIUM PHOSPHATE 10 MG/ML IJ SOLN
INTRAMUSCULAR | Status: AC
  Filled 2015-10-23: qty 1

## 2015-10-23 MED ORDER — BUPIVACAINE HCL 0.25 % IJ SOLN
INTRAMUSCULAR | Status: DC | PRN
  Administered 2015-10-23: 20 mL

## 2015-10-23 MED ORDER — CEFAZOLIN SODIUM-DEXTROSE 2-4 GM/100ML-% IV SOLN
INTRAVENOUS | Status: AC
  Filled 2015-10-23: qty 100

## 2015-10-23 MED ORDER — INSULIN GLARGINE 100 UNIT/ML ~~LOC~~ SOLN
40.0000 [IU] | Freq: Every morning | SUBCUTANEOUS | Status: DC
  Administered 2015-10-24 – 2015-10-25 (×2): 40 [IU] via SUBCUTANEOUS
  Filled 2015-10-23 (×2): qty 0.4

## 2015-10-23 MED ORDER — HYDROMORPHONE HCL 1 MG/ML IJ SOLN: 0.2500 mg | INTRAMUSCULAR | Status: DC | PRN

## 2015-10-23 SURGICAL SUPPLY — 50 items
BAG DECANTER FOR FLEXI CONT (MISCELLANEOUS) ×2 IMPLANT
BAG SPEC THK2 15X12 ZIP CLS (MISCELLANEOUS) ×1
BAG ZIPLOCK 12X15 (MISCELLANEOUS) ×2 IMPLANT
BANDAGE ACE 6X5 VEL STRL LF (GAUZE/BANDAGES/DRESSINGS) ×2 IMPLANT
BLADE SAG 18X100X1.27 (BLADE) ×2 IMPLANT
BLADE SAW SGTL 11.0X1.19X90.0M (BLADE) ×2 IMPLANT
BOWL SMART MIX CTS (DISPOSABLE) ×2 IMPLANT
CAPT KNEE TOTAL 3 ATTUNE ×1 IMPLANT
CEMENT HV SMART SET (Cement) ×4 IMPLANT
CLOTH BEACON ORANGE TIMEOUT ST (SAFETY) ×2 IMPLANT
CUFF TOURN SGL QUICK 34 (TOURNIQUET CUFF) ×2
CUFF TRNQT CYL 34X4X40X1 (TOURNIQUET CUFF) ×1 IMPLANT
DECANTER SPIKE VIAL GLASS SM (MISCELLANEOUS) ×2 IMPLANT
DRAPE U-SHAPE 47X51 STRL (DRAPES) ×2 IMPLANT
DRSG ADAPTIC 3X8 NADH LF (GAUZE/BANDAGES/DRESSINGS) ×2 IMPLANT
DRSG PAD ABDOMINAL 8X10 ST (GAUZE/BANDAGES/DRESSINGS) ×2 IMPLANT
DURAPREP 26ML APPLICATOR (WOUND CARE) ×2 IMPLANT
ELECT REM PT RETURN 9FT ADLT (ELECTROSURGICAL) ×2
ELECTRODE REM PT RTRN 9FT ADLT (ELECTROSURGICAL) ×1 IMPLANT
EVACUATOR 1/8 PVC DRAIN (DRAIN) ×2 IMPLANT
GAUZE SPONGE 4X4 12PLY STRL (GAUZE/BANDAGES/DRESSINGS) ×2 IMPLANT
GLOVE BIO SURGEON STRL SZ7.5 (GLOVE) IMPLANT
GLOVE BIO SURGEON STRL SZ8 (GLOVE) ×2 IMPLANT
GLOVE BIOGEL PI IND STRL 6.5 (GLOVE) IMPLANT
GLOVE BIOGEL PI IND STRL 8 (GLOVE) ×1 IMPLANT
GLOVE BIOGEL PI INDICATOR 6.5 (GLOVE) ×1
GLOVE BIOGEL PI INDICATOR 8 (GLOVE) ×1
GLOVE SURG SS PI 6.5 STRL IVOR (GLOVE) ×1 IMPLANT
GOWN STRL REUS W/TWL LRG LVL3 (GOWN DISPOSABLE) ×3 IMPLANT
GOWN STRL REUS W/TWL XL LVL3 (GOWN DISPOSABLE) IMPLANT
HANDPIECE INTERPULSE COAX TIP (DISPOSABLE) ×2
IMMOBILIZER KNEE 20 (SOFTGOODS) ×2
IMMOBILIZER KNEE 20 THIGH 36 (SOFTGOODS) ×1 IMPLANT
MANIFOLD NEPTUNE II (INSTRUMENTS) ×2 IMPLANT
NS IRRIG 1000ML POUR BTL (IV SOLUTION) ×2 IMPLANT
PACK TOTAL KNEE CUSTOM (KITS) ×2 IMPLANT
PADDING CAST COTTON 6X4 STRL (CAST SUPPLIES) ×5 IMPLANT
POSITIONER SURGICAL ARM (MISCELLANEOUS) ×2 IMPLANT
SET HNDPC FAN SPRY TIP SCT (DISPOSABLE) ×1 IMPLANT
STRIP CLOSURE SKIN 1/2X4 (GAUZE/BANDAGES/DRESSINGS) ×4 IMPLANT
SUT MNCRL AB 4-0 PS2 18 (SUTURE) ×2 IMPLANT
SUT VIC AB 2-0 CT1 27 (SUTURE) ×6
SUT VIC AB 2-0 CT1 TAPERPNT 27 (SUTURE) ×3 IMPLANT
SUT VLOC 180 0 24IN GS25 (SUTURE) ×2 IMPLANT
SYR 50ML LL SCALE MARK (SYRINGE) ×2 IMPLANT
TRAY FOLEY W/METER SILVER 14FR (SET/KITS/TRAYS/PACK) ×2 IMPLANT
TRAY FOLEY W/METER SILVER 16FR (SET/KITS/TRAYS/PACK) ×1 IMPLANT
WATER STERILE IRR 1500ML POUR (IV SOLUTION) ×2 IMPLANT
WRAP KNEE MAXI GEL POST OP (GAUZE/BANDAGES/DRESSINGS) ×2 IMPLANT
YANKAUER SUCT BULB TIP 10FT TU (MISCELLANEOUS) ×2 IMPLANT

## 2015-10-23 NOTE — Anesthesia Procedure Notes (Signed)
Spinal Patient location during procedure: OR Start time: 10/23/2015 7:06 AM End time: 10/23/2015 7:07 AM Staffing Resident/CRNA: Chandra Batch A Performed by: resident/CRNA  Preanesthetic Checklist Completed: patient identified, site marked, surgical consent, pre-op evaluation, timeout performed, IV checked, risks and benefits discussed and monitors and equipment checked Spinal Block Patient position: sitting Prep: Betadine Patient monitoring: heart rate, cardiac monitor, continuous pulse ox and blood pressure Approach: midline Location: L2-3 Injection technique: single-shot Needle Needle type: Quincke  Needle gauge: 22 G Needle length: 9 cm Needle insertion depth: 5 cm Assessment Sensory level: T6

## 2015-10-23 NOTE — Transfer of Care (Signed)
Immediate Anesthesia Transfer of Care Note  Patient: [name]  Procedure(s) Performed: Procedure(s): TOTAL LEFT KNEE ARTHROPLASTY (Left)  Patient Location: PACU  Anesthesia Type:Spinal  Level of Consciousness: awake, alert  and oriented  Airway & Oxygen Therapy: Patient Spontanous Breathing and Patient connected to face mask oxygen  Post-op Assessment: Report given to RN and Post -op Vital signs reviewed and stable  Post vital signs: Reviewed and stable  Last Vitals:  Filed Vitals:   10/23/15 0523  BP: 116/64  Pulse: 83  Temp: 36.6 C  Resp: 16    Complications: No apparent anesthesia complications

## 2015-10-23 NOTE — H&P (View-Only) (Signed)
[name] DOB: 12/05/1940 Married / Language: English / Race: White Female Date of Admission:  10/23/2015 CC:  Left knee pain History of Present Illness The patient is a 75 year old female who comes in for a preoperative History and Physical. The patient is scheduled for a left total knee arthroplasty to be performed by Dr. Dione Plover. Aluisio, MD at [facility] on 10-23-2015. The patient is a 75 year old female who presents today for follow up of their knee. The patient is being followed for their left knee pain and osteoarthritis. They are out from a cortisone injection. Symptoms reported today include: pain, instability and difficulty ambulating (worse with going down steps). The patient feels that they are doing poorly and report their pain level to be moderate to severe. The following medication has been used for pain control: antiinflammatory medication in the form of ibuprofen. Pt. state's the cortisone injection helped in the past but only temporary. She has had several injections in the past. Viscosupplementation has not been effective. She says when it wears off, she is in significant pain. She had an injection several weeks ago, and obviously is having pain again. She is now a t a point where she would like to get the knee fixed at this time. They have been treated conservatively in the past for the above stated problem and despite conservative measures, they continue to have progressive pain and severe functional limitations and dysfunction. They have failed non-operative management including home exercise, medications, and injections. It is felt that they would benefit from undergoing total joint replacement. Risks and benefits of the procedure have been discussed with the patient and they elect to proceed with surgery. There are no active contraindications to surgery such as ongoing infection or rapidly progressive neurological disease.  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 Hypertension  mother and sister Rheumatoid Arthritis  father  Social History Tobacco use  Never smoker. 11/11/2014 No alcohol use  Alcohol use  never consumed alcohol Children  2 Current work status  retired Engineer, agricultural (Currently)  no Exercise  Exercises rarely Illicit drug use  no Living situation  live with spouse Marital status  married Never consumed alcohol  11/11/2014: Never consumed alcohol No history of drug/alcohol rehab  Not under pain contract  Number of flights of stairs before winded  2-3 Pain Contract  no Previously in rehab  no Tobacco / smoke exposure  11/11/2014: no Post-Surgical Plans  HOME  Medication History  Janumet (50-'1000MG'$  Tablet, Oral) Active. Furosemide ('40MG'$  Tablet, Oral) Active. Lantus (100UNIT/ML Solution, Subcutaneous) Active. Toprol XL ('50MG'$  Tablet ER 24HR, Oral) Active. Estradiol (0.'5MG'$  Tablet, Oral) Active. Aspirin ('81MG'$  Tablet Chewable, Oral) Active. Vitamin D3 (2000UNIT Tablet Chewable, Oral) Active.   Past Surgical History Mastectomy - Bilateral  left Mammoplasty; Reduction  right Hysterectomy  complete (non-cancerous) Total Knee Replacement  Date: 01/2006. right Spinal Fusion  neck Neck Disc Surgery  Breast Biopsy  left Arthroscopy of Knee   right Appendectomy  Cataract Surgery  bilateral Breast Reconstruction  left Breast Mass; Local Excision  left Revision Right Total Knee  Oophorectomy  bilateral  Review of Systems General Not Present- Chills, Fatigue, Fever, Memory Loss, Night Sweats, Weight Gain and Weight Loss. Skin Not Present- Eczema, Hives, Itching, Lesions and Rash. HEENT Not Present- Dentures, Double Vision, Headache, Hearing Loss, Tinnitus and Visual Loss. Respiratory Not Present- Allergies, Chronic Cough, Coughing up blood, Shortness of breath at rest and Shortness of breath with exertion. Cardiovascular Not Present- Chest Pain, Difficulty Breathing Lying Down, Murmur, Palpitations, Racing/skipping heartbeats and Swelling. Gastrointestinal Not Present- Abdominal Pain, Bloody Stool, Constipation, Diarrhea, Difficulty Swallowing, Heartburn, Jaundice, Loss of appetitie, Nausea and Vomiting. Female Genitourinary Not Present- Blood in Urine, Discharge, Flank Pain, Incontinence, Painful Urination, Urgency, Urinary frequency, Urinary Retention, Urinating at Night and Weak urinary stream. Musculoskeletal Present- Joint Pain and Morning Stiffness. Not Present- Back Pain, Joint Swelling, Muscle Pain, Muscle Weakness and Spasms. Neurological Not Present- Blackout spells, Difficulty with balance, Dizziness, Paralysis, Tremor and Weakness. Psychiatric Not Present- Insomnia.  Vitals  Weight: 169 lb Height: 67in Body Surface Area: 1.88 m Body Mass Index: 26.47 kg/m  Pulse: 64 (Regular)  BP: 138/70 (Sitting, Right Arm, Standard) AVOID LEFT ARM FOR IV'S AND BP'S  Physical Exam  General Mental Status -Alert, cooperative and good historian. General Appearance-pleasant, Not in acute distress. Orientation-Oriented X3. Build & Nutrition-Well nourished and Well developed.  Head and Neck Head-normocephalic, atraumatic . Neck Global Assessment - supple, no bruit auscultated on the right, no bruit  auscultated on the left.  Eye Vision-Wears corrective lenses. Pupil - Bilateral-Regular and Round. Motion - Bilateral-EOMI.  ENMT Note: Partial Lower Denture Plate  Chest and Lung Exam Auscultation Breath sounds - clear at anterior chest wall and clear at posterior chest wall. Adventitious sounds - No Adventitious sounds.  Cardiovascular Auscultation Rhythm - Regular rate and rhythm. Heart Sounds - S1 WNL and S2 WNL. Murmurs & Other Heart Sounds - Auscultation of the heart reveals - No Murmurs.  Abdomen Palpation/Percussion Tenderness - Abdomen is non-tender to palpation. Rigidity (guarding) - Abdomen is soft. Auscultation Auscultation of the abdomen reveals - Bowel sounds normal.  Female Genitourinary Note: Not done, not pertinent to present illness  Musculoskeletal Note: She is in no distress. Left knee, no effusion. No marked crepitus on range of motion, range about 10 to 120. There is no instability. She is tender, medial greater than lateral.  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 Inda Merlin - Patient has been seen preoperatively and felt to be stable for surgery. Cards: Dr. Tamala Julian - Patient has been seen preoperatively and felt to be stable for surgery.  Topical TXA - Breast Cancer  Anesthesia Issues: None exccept for nausea one time.  Signed electronically by Ok Edwards, III PA-C

## 2015-10-23 NOTE — Interval H&P Note (Signed)
History and Physical Interval Note:  10/23/2015 6:52 AM  [name]  has presented today for surgery, with the diagnosis of LEFT KNEE OA  The various methods of treatment have been discussed with the patient and family. After consideration of risks, benefits and other options for treatment, the patient has consented to  Procedure(s): TOTAL LEFT KNEE ARTHROPLASTY (Left) as a surgical intervention .  The patient's history has been reviewed, patient examined, no change in status, stable for surgery.  I have reviewed the patient's chart and labs.  Questions were answered to the patient's satisfaction.     [name]

## 2015-10-23 NOTE — Op Note (Addendum)
Pre-operative diagnosis- Osteoarthritis  Left knee(s)  Post-operative diagnosis- Osteoarthritis Left knee(s)  Procedure-  Left  Total Knee Arthroplasty  Surgeon- Dione Plover. Nyxon Strupp, MD  Assistant- Ardeen Jourdain, PA-C   Anesthesia-  Spinal  EBL-* No blood loss amount entered *   Drains Hemovac  Tourniquet time-  Total Tourniquet Time Documented: Thigh (Right) - 34 minutes Total: Thigh (Right) - 34 minutes     Complications- None  Condition-PACU - hemodynamically stable.   Brief Clinical Note  [name] is a 75 y.o. year old female with end stage OA of her left knee with progressively worsening pain and dysfunction. She has constant pain, with activity and at rest and significant functional deficits with difficulties even with ADLs. She has had extensive non-op management including analgesics, injections of cortisone and viscosupplements, and home exercise program, but remains in significant pain with significant dysfunction. Radiographs show bone on bone arthritis medial and patellofemoral. She presents now for left Total Knee Arthroplasty.    Procedure in detail---   The patient is brought into the operating room and positioned supine on the operating table. After successful administration of Spinal,   a tourniquet is placed high on the  Left thigh(s) and the lower extremity is prepped and draped in the usual sterile fashion. Time out is performed by the operating team and then the  Left lower extremity is wrapped in Esmarch, knee flexed and the tourniquet inflated to 300 mmHg.       A midline incision is made with a ten blade through the subcutaneous tissue to the level of the extensor mechanism. A fresh blade is used to make a medial parapatellar arthrotomy. Soft tissue over the proximal medial tibia is subperiosteally elevated to the joint line with a knife and into the semimembranosus bursa with a Cobb elevator. Soft tissue over the proximal lateral tibia is elevated with  attention being paid to avoiding the patellar tendon on the tibial tubercle. The patella is everted, knee flexed 90 degrees and the ACL and PCL are removed. Findings are bone on bone medial and patellofemoral with large global osteophytes.        The drill is used to create a starting hole in the distal femur and the canal is thoroughly irrigated with sterile saline to remove the fatty contents. The 5 degree Left  valgus alignment guide is placed into the femoral canal and the distal femoral cutting block is pinned to remove 9 mm off the distal femur. Resection is made with an oscillating saw.      The tibia is subluxed forward and the menisci are removed. The extramedullary alignment guide is placed referencing proximally at the medial aspect of the tibial tubercle and distally along the second metatarsal axis and tibial crest. The block is pinned to remove 65m off the more deficient medial  side. Resection is made with an oscillating saw. Size 5 is the most appropriate size for the tibia and the proximal tibia is prepared with the modular drill and keel punch for that size.      The femoral sizing guide is placed and size 6 is most appropriate. Rotation is marked off the epicondylar axis and confirmed by creating a rectangular flexion gap at 90 degrees. The size 6 cutting block is pinned in this rotation and the anterior, posterior and chamfer cuts are made with the oscillating saw. The intercondylar block is then placed and that cut is made.      Trial size 5 tibial component,  trial size 6 narrow posterior stabilized femur and a 6  mm posterior stabilized rotating platform insert trial is placed. Full extension is achieved with excellent varus/valgus and anterior/posterior balance throughout full range of motion. The patella is everted and thickness measured to be 22  mm. Free hand resection is taken to 12 mm, a 35 template is placed, lug holes are drilled, trial patella is placed, and it tracks normally.  Osteophytes are removed off the posterior femur with the trial in place. All trials are removed and the cut bone surfaces prepared with pulsatile lavage. Cement is mixed and once ready for implantation, the size 5 tibial implant, size  6 narrow posterior stabilized femoral component, and the size 35 patella are cemented in place and the patella is held with the clamp. The trial insert is placed and the knee held in full extension. The Exparel (20 ml mixed with 30 ml saline) and .25% Bupivicaine, are injected into the extensor mechanism, posterior capsule, medial and lateral gutters and subcutaneous tissues.  All extruded cement is removed and once the cement is hard the permanent 6 mm posterior stabilized rotating platform insert is placed into the tibial tray.      The wound is copiously irrigated with saline solution and the extensor mechanism closed over a hemovac drain with #1 V-loc suture. The tourniquet is released for a total tourniquet time of 34  minutes. Flexion against gravity is 140 degrees and the patella tracks normally. Subcutaneous tissue is closed with 2.0 vicryl and subcuticular with running 4.0 Monocryl. The incision is cleaned and dried and steri-strips and a bulky sterile dressing are applied. The limb is placed into a knee immobilizer and the patient is awakened and transported to recovery in stable condition.      Please note that a surgical assistant was a medical necessity for this procedure in order to perform it in a safe and expeditious manner. Surgical assistant was necessary to retract the ligaments and vital neurovascular structures to prevent injury to them and also necessary for proper positioning of the limb to allow for anatomic placement of the prosthesis.   Dione Plover Angelica Frandsen, MD    10/23/2015, 8:06 AM

## 2015-10-23 NOTE — Anesthesia Postprocedure Evaluation (Signed)
Anesthesia Post Note  Patient: [name]  Procedure(s) Performed: Procedure(s) (LRB): TOTAL LEFT KNEE ARTHROPLASTY (Left)  Patient location during evaluation: PACU Anesthesia Type: Spinal Level of consciousness: oriented and awake and alert Pain management: pain level controlled Vital Signs Assessment: post-procedure vital signs reviewed and stable Respiratory status: spontaneous breathing, respiratory function stable and patient connected to nasal cannula oxygen Cardiovascular status: blood pressure returned to baseline and stable Postop Assessment: no headache, no backache and patient able to bend at knees Anesthetic complications: no    Last Vitals:  Filed Vitals:   10/23/15 0900 10/23/15 0915  BP: 117/60 120/61  Pulse: 68 57  Temp: 34.7 C   Resp: 18 12    Last Pain: There were no vitals filed for this visit.               Bhavesh Vazquez,JAMES TERRILL

## 2015-10-23 NOTE — Evaluation (Signed)
Physical Therapy Evaluation Patient Details Name: [name] MRN: [id number] DOB: 06-25-1941 Today's Date: 10/23/2015   History of Present Illness  s/p L TKA; PMHx: R TKA /manipulation  Clinical Impression  Pt is s/p TKA resulting in the deficits listed below (see PT Problem List).  Pt will benefit from skilled PT to increase their independence and safety with mobility to allow discharge to the venue listed below. Pt motivated to work with PT, amb 50' this pm;     Follow Up Recommendations Home health PT    Equipment Recommendations  Rolling walker with 5" wheels (if pt desires (thinks she has SW at home))    Recommendations for Other Services       Precautions / Restrictions Precautions Precautions: Knee Required Braces or Orthoses: Knee Immobilizer - Left Knee Immobilizer - Left: Discontinue once straight leg raise with < 10 degree lag      Mobility  Bed Mobility Overal bed mobility: Needs Assistance Bed Mobility: Supine to Sit     Supine to sit: Min assist     General bed mobility comments: assist with L LE  Transfers Overall transfer level: Needs assistance Equipment used: Rolling walker (2 wheeled) Transfers: Sit to/from Stand Sit to Stand: Min assist            Ambulation/Gait Ambulation/Gait assistance: Min assist Ambulation Distance (Feet): 50 Feet Assistive device: Rolling walker (2 wheeled) Gait Pattern/deviations: Step-to pattern;Antalgic     General Gait Details: cues for sequence and RW position  Stairs            Wheelchair Mobility    Modified Rankin (Stroke Patients Only)       Balance                                             Pertinent Vitals/Pain Pain Assessment: 0-10 Pain Score: 4  Pain Location: L knee Pain Descriptors / Indicators: Aching Pain Intervention(s): Limited activity within patient's tolerance;Monitored during session    Home Living Family/patient expects to be discharged to::  Private residence Living Arrangements: Spouse/significant other Available Help at Discharge: Family Type of Home: House Home Access: Stairs to enter   Technical brewer of Steps: 1 Home Layout: One level;Laundry or work area in [place]: Transport planner - 2 wheels      Prior Function Level of Independence: Independent               Hand Dominance        Extremity/Trunk Assessment               Lower Extremity Assessment: LLE deficits/detail   LLE Deficits / Details: ankle WFL, knee extension and hip flexion 3/5     Communication   Communication: No difficulties  Cognition Arousal/Alertness: Awake/alert Behavior During Therapy: WFL for tasks assessed/performed Overall Cognitive Status: Within Functional Limits for tasks assessed                      General Comments      Exercises Total Joint Exercises Ankle Circles/Pumps: AROM;Both;10 reps Quad Sets: 10 reps;Both;AROM;Strengthening      Assessment/Plan    PT Assessment Patient needs continued PT services  PT Diagnosis Difficulty walking   PT Problem List Decreased activity tolerance;Decreased mobility;Decreased range of motion;Decreased strength;Decreased knowledge of use of DME  PT Treatment Interventions DME instruction;Gait training;Functional mobility  training;Therapeutic activities;Therapeutic exercise;Patient/family education;Stair training   PT Goals (Current goals can be found in the Care Plan section) Acute Rehab PT Goals Patient Stated Goal: get her movement back in L knee PT Goal Formulation: With patient Time For Goal Achievement: 10/27/15 Potential to Achieve Goals: Good    Frequency 7X/week   Barriers to discharge        Co-evaluation               End of Session Equipment Utilized During Treatment: Gait belt Activity Tolerance: Patient tolerated treatment well Patient left: with call bell/phone within reach;in chair (no alarms  available)           Time: 2229-7989 PT Time Calculation (min) (ACUTE ONLY): 19 min   Charges:   PT Evaluation $PT Eval Low Complexity: 1 Procedure     PT G Codes:        Nael Petrosyan Oct 31, 2015, 4:16 PM

## 2015-10-24 LAB — BASIC METABOLIC PANEL
Anion gap: 7 (ref 5–15)
BUN: 17 mg/dL (ref 6–20)
CO2: 26 mmol/L (ref 22–32)
Calcium: 8.3 mg/dL — ABNORMAL LOW (ref 8.9–10.3)
Chloride: 104 mmol/L (ref 101–111)
Creatinine, Ser: 0.73 mg/dL (ref 0.44–1.00)
GFR calc Af Amer: >60 mL/min (ref >60)
GFR calc non Af Amer: >60 mL/min (ref >60)
Glucose, Bld: 271 mg/dL — ABNORMAL HIGH (ref 65–99)
Potassium: 4.8 mmol/L (ref 3.5–5.1)
Sodium: 137 mmol/L (ref 135–145)

## 2015-10-24 LAB — GLUCOSE, CAPILLARY
Glucose-Capillary: 213 mg/dL — ABNORMAL HIGH (ref 65–99)
Glucose-Capillary: 180 mg/dL — ABNORMAL HIGH (ref 65–99)
Glucose-Capillary: 276 mg/dL — ABNORMAL HIGH (ref 65–99)
Glucose-Capillary: 254 mg/dL — ABNORMAL HIGH (ref 65–99)

## 2015-10-24 LAB — CBC
HCT: 30.3 % — ABNORMAL LOW (ref 36.0–46.0)
Hemoglobin: 10.1 g/dL — ABNORMAL LOW (ref 12.0–15.0)
MCH: 28.9 pg (ref 26.0–34.0)
MCHC: 33.3 g/dL (ref 30.0–36.0)
MCV: 86.8 fL (ref 78.0–100.0)
Platelets: 208 10*3/uL (ref 150–400)
RBC: 3.49 MIL/uL — ABNORMAL LOW (ref 3.87–5.11)
RDW: 13.3 % (ref 11.5–15.5)
WBC: 10 10*3/uL (ref 4.0–10.5)

## 2015-10-24 MED ORDER — HYDROMORPHONE HCL 2 MG PO TABS: 2.0000 mg | ORAL_TABLET | ORAL | Status: DC | PRN

## 2015-10-24 MED ORDER — TRAMADOL HCL 50 MG PO TABS: 50.0000 mg | ORAL_TABLET | Freq: Four times a day (QID) | ORAL | Status: DC | PRN

## 2015-10-24 MED ORDER — RIVAROXABAN 10 MG PO TABS: 10.0000 mg | ORAL_TABLET | Freq: Every day | ORAL | Status: DC

## 2015-10-24 MED ORDER — METHOCARBAMOL 500 MG PO TABS: 500.0000 mg | ORAL_TABLET | Freq: Four times a day (QID) | ORAL | Status: DC | PRN

## 2015-10-24 NOTE — Progress Notes (Signed)
   Subjective: 1 Day Post-Op Procedure(s) (LRB): TOTAL LEFT KNEE ARTHROPLASTY (Left) Patient reports pain as mild.  Didn't tolerate oxycodone. Did well with tramadol Plan is to go Home after hospital stay.  Objective: Vital signs in last 24 hours: Temp:  [94.5 F (34.7 C)-98.3 F (36.8 C)] 98.2 F (36.8 C) (04/18 0625) Pulse Rate:  [54-84] 61 (04/18 0625) Resp:  [12-21] 18 (04/18 0625) BP: (92-134)/(50-87) 103/53 mmHg (04/18 0625) SpO2:  [95 %-100 %] 99 % (04/18 0625)  Intake/Output from previous day:  Intake/Output Summary (Last 24 hours) at 10/24/15 0703 Last data filed at 10/24/15 0626  Gross per 24 hour  Intake 4038.75 ml  Output   2840 ml  Net 1198.75 ml    Intake/Output this shift:    Labs:  Recent Labs  10/24/15 0400  HGB 10.1*    Recent Labs  10/24/15 0400  WBC 10.0  RBC 3.49*  HCT 30.3*  PLT 208    Recent Labs  10/24/15 0400  NA 137  K 4.8  CL 104  CO2 26  BUN 17  CREATININE 0.73  GLUCOSE 271*  CALCIUM 8.3*   No results for input(s): LABPT, INR in the last 72 hours.  EXAM General - Patient is Alert, Appropriate and Oriented Extremity - Neurologically intact Neurovascular intact No cellulitis present Compartment soft Dressing - dressing C/D/I Motor Function - intact, moving foot and toes well on exam.  Hemovac pulled without difficulty.  Past Medical History  Diagnosis Date  . Hyperlipidemia   . 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 Diabetes mellitus ORAL AND INSULIN MEDS  . Heart palpitations SINCE 2008 TAKES BETA BLOCKER  . PONV (postoperative nausea and vomiting)   . B12 deficiency anemia   . Hypertension   . Neuropathy, diabetic ([place]) RIGHT FOOT MILD NUMBNESS  . Fibrosis of knee joint RIGHT  . Arthritis     Assessment/Plan: 1 Day Post-Op Procedure(s) (LRB): TOTAL LEFT KNEE ARTHROPLASTY (Left) Principal  Problem:   OA (osteoarthritis) of knee   Advance diet Up with therapy D/C IV fluids Plan for discharge tomorrow  Will be doing virtual therapy. No need for home health  DVT Prophylaxis - Xarelto Weight-Bearing as tolerated to left leg D/C O2 and Pulse OX and try on Room Air  Irisa Grimsley V 10/24/2015, 7:03 AM

## 2015-10-24 NOTE — Evaluation (Addendum)
Occupational Therapy Evaluation AND Discharge  Patient Details Name: [name] MRN: [id number] DOB: 08/27/1940 Today's Date: 10/24/2015    History of Present Illness s/p L TKA; PMHx: R TKA /manipulation   Clinical Impression   Patient admitted with above. Patient independent PTA. Patient currently functioning at an overall supervision to min assist/min guard level.  No additional OT needs identified, D/C from acute OT services and no additional follow-up OT needs at this time. All appropriate education provided to patient. Please re-order OT if needed. See under ADL comment for more information regarding treatment during this session.     Follow Up Recommendations  No OT follow up;Supervision/Assistance - 24 hour    Equipment Recommendations  None recommended by OT    Recommendations for Other Services  None at this time  Precautions / Restrictions Precautions Precautions: Knee Precaution Comments: KI not used, pt able to perform I SLRs Restrictions Weight Bearing Restrictions: Yes LLE Weight Bearing: Weight bearing as tolerated   Mobility Bed Mobility Overal bed mobility: Needs Assistance Bed Mobility: Sit to Supine     Supine to sit: Supervision     General bed mobility comments: for safety, pt able to manage LLE independently   Transfers Overall transfer level: Needs assistance Equipment used: Rolling walker (2 wheeled) Transfers: Sit to/from Stand Sit to Stand: Min guard;Supervision         General transfer comment: Cues for safety with RW    Balance Overall balance assessment: Needs assistance Sitting-balance support: No upper extremity supported;Feet supported Sitting balance-Leahy Scale: Good     Standing balance support: Bilateral upper extremity supported;During functional activity Standing balance-Leahy Scale: Good Standing balance comment: reliant on RW    ADL Overall ADL's : Needs assistance/impaired Eating/Feeding: Set up;Sitting    Grooming: Supervision/safety;Standing   Upper Body Bathing: Set up;Sitting   Lower Body Bathing: Minimal assistance;Sit to/from stand   Upper Body Dressing : Set up;Sitting   Lower Body Dressing: Minimal assistance;Sit to/from stand   Toilet Transfer: Min guard;RW;BSC;Ambulation   Toileting- Water quality scientist and Hygiene: Supervision/safety;Sitting/lateral lean   Tub/ Banker: Supervision/safety;Ambulation;Anterior/posterior;Rolling walker;Walk-in shower   Functional mobility during ADLs: Supervision/safety;Min guard General ADL Comments: Pt overall supervision to min assist with ADLs. Pt with recent R knee replacement 2007 with revision in 2012. Pt has needed equipment and will have assistance prn from husband. Discussed toilet transfers and walk-in shower transfers.     Pertinent Vitals/Pain Pain Assessment: 0-10 Pain Score: 5  Pain Location: L knee Pain Descriptors / Indicators: Aching;Sore Pain Intervention(s): Monitored during session;Repositioned;Ice applied     Hand Dominance Right   Extremity/Trunk Assessment Upper Extremity Assessment Upper Extremity Assessment: Overall WFL for tasks assessed   Lower Extremity Assessment Lower Extremity Assessment: Defer to PT evaluation   Cervical / Trunk Assessment Cervical / Trunk Assessment: Normal   Communication Communication Communication: No difficulties   Cognition Arousal/Alertness: Awake/alert Behavior During Therapy: WFL for tasks assessed/performed Overall Cognitive Status: Within Functional Limits for tasks assessed              Home Living Family/patient expects to be discharged to:: Private residence Living Arrangements: Spouse/significant other Available Help at Discharge: Family Type of Home: House Home Access: Stairs to enter [employer] of Steps: 1   Home Layout: One level;Laundry or work area in basement     [employer] Shower/Tub: [facility];Door   [employer] Toilet:  Standard     Home Equipment: Bedside commode;Walker - 2 wheels;Shower seat   Prior Functioning/Environment Level of  Independence: Independent     OT Diagnosis: Generalized weakness;Acute pain   OT Problem List:   N/a, no acute OT needs identified at this time     OT Treatment/Interventions:   N/a, no acute OT needs identified at this time     OT Goals(Current goals can be found in the care plan section) Acute Rehab OT Goals Patient Stated Goal: go home tomorrow  OT Goal Formulation: All assessment and education complete, DC therapy  OT Frequency:   N/a, no acute OT needs identified at this time     Barriers to D/C:  None known at this time    End of Session Equipment Utilized During Treatment: Gait belt;Rolling walker CPM Left Knee CPM Left Knee: Off Nurse Communication: Other (comment) (blood in urine and blood on chuck in recliner )  Activity Tolerance: Patient tolerated treatment well Patient left: in bed;with call bell/phone within reach;with bed alarm set   Time: 0950-1015 OT Time Calculation (min): 25 min Charges:  OT General Charges $OT Visit: 1 Procedure OT Evaluation $OT Eval Moderate Complexity: 1 Procedure OT Treatments $Self Care/Home Management : 8-22 mins  Chrys Racer , MS, OTR/L, CLT Pager: [phone]  10/24/2015, 10:22 AM

## 2015-10-24 NOTE — Progress Notes (Signed)
Inpatient Diabetes Program Recommendations  AACE/ADA: New Consensus Statement on Inpatient Glycemic Control (2015)  Target Ranges:  Prepandial:   less than 140 mg/dL      Peak postprandial:   less than 180 mg/dL (1-2 hours)      Critically ill patients:  140 - 180 mg/dL   Review of Glycemic Control  Diabetes history: DM2 Outpatient Diabetes medications: Lantus 40 units QAM, Janumet 50/500 bid Current orders for Inpatient glycemic control: Lantus 40 units QAM, tradjenta 5 mg QD, metformin 500 bid, Novolog moderate tidwc   Hgba1C - 7.1% - acceptable glycemic control Blood sugars elevated d/t Decadron. Should improve with home meds restarting this am.  Results for DYLANA, SHAW (MRN [id number]) as of 10/24/2015 09:12  Ref. Range 10/23/2015 11:00 10/23/2015 16:43  Glucose-Capillary Latest Ref Range: 65-99 mg/dL 198 (H) 280 (H)  Results for EVELIN, CAKE (MRN [id number]) as of 10/24/2015 09:12  Ref. Range 10/24/2015 04:00  Glucose Latest Ref Range: 65-99 mg/dL 271 (H)   Inpatient Diabetes Program Recommendations:    Add HS correction.  Will follow. Thank you. Lorenda Peck, RD, LDN, CDE Inpatient Diabetes Coordinator [phone]

## 2015-10-24 NOTE — Care Management Note (Signed)
Case Management Note  Patient Details  Name: [name] MRN: [id number] Date of Birth: 07-16-1940  Subjective/Objective:      75 yo admitted with total knee replacement              Action/Plan: From home with spouse.  Expected Discharge Date:                  Expected Discharge Plan:  Home/Self Care  In-House Referral:     Discharge planning Services  CM Consult  Post Acute Care Choice:    Choice offered to:     DME Arranged:    DME Agency:     HH Arranged:    HH Agency:     Status of Service:  In process, will continue to follow  Medicare Important Message Given:    Date Medicare IM Given:    Medicare IM give by:    Date Additional Medicare IM Given:    Additional Medicare Important Message give by:     If discussed at [place] of Stay Meetings, dates discussed:    Additional Comments: This CM met with pt at bedside to discuss DC plans.  Pt states she has been set up for virtual physical therapy by her MD office.  She states she has a walker, potty chair and shower chair at home from having her other knee done and doesn't need any additional equipment.  No other CM needs communicated. Lynnell Catalan, RN 10/24/2015, 3:09 PM [phone]

## 2015-10-24 NOTE — Discharge Instructions (Addendum)
° °Dr. Frank Aluisio °Total Joint Specialist °[facility] °[address] °[place], [place] [address] °[phone] ° °TOTAL KNEE REPLACEMENT POSTOPERATIVE DIRECTIONS ° °Knee Rehabilitation, Guidelines Following Surgery  °Results after knee surgery are often greatly improved when you follow the exercise, range of motion and muscle strengthening exercises prescribed by your doctor. Safety measures are also important to protect the knee from further injury. Any time any of these exercises cause you to have increased pain or swelling in your knee joint, decrease the amount until you are comfortable again and slowly increase them. If you have problems or questions, call your caregiver or physical therapist for advice.  ° °HOME CARE INSTRUCTIONS  °Remove items at home which could result in a fall. This includes throw rugs or furniture in walking pathways.  °· ICE to the affected knee every three hours for 30 minutes at a time and then as needed for pain and swelling.  Continue to use ice on the knee for pain and swelling from surgery. You may notice swelling that will progress down to the foot and ankle.  This is normal after surgery.  Elevate the leg when you are not up walking on it.   °· Continue to use the breathing machine which will help keep your temperature down.  It is common for your temperature to cycle up and down following surgery, especially at night when you are not up moving around and exerting yourself.  The breathing machine keeps your lungs expanded and your temperature down. °· Do not place pillow under knee, focus on keeping the knee straight while resting ° °DIET °You may resume your previous home diet once your are discharged from the hospital. ° °DRESSING / WOUND CARE / SHOWERING °You may start showering once you are discharged home but do not submerge the incision under water. Just pat the incision dry and apply a dry gauze dressing on daily. °Change the surgical dressing  daily and reapply a dry dressing each time. ° °ACTIVITY °Walk with your walker as instructed. °Use walker as long as suggested by your caregivers. °Avoid periods of inactivity such as sitting longer than an hour when not asleep. This helps prevent blood clots.  °You may resume a sexual relationship in one month or when given the OK by your doctor.  °You may return to work once you are cleared by your doctor.  °Do not drive a car for 6 weeks or until released by you surgeon.  °Do not drive while taking narcotics. ° °WEIGHT BEARING °Weight bearing as tolerated with assist device (walker, cane, etc) as directed, use it as long as suggested by your surgeon or therapist, typically at least 4-6 weeks. ° °POSTOPERATIVE CONSTIPATION PROTOCOL °Constipation - defined medically as fewer than three stools per week and severe constipation as less than one stool per week. ° °One of the most common issues patients have following surgery is constipation.  Even if you have a regular bowel pattern at home, your normal regimen is likely to be disrupted due to multiple reasons following surgery.  Combination of anesthesia, postoperative narcotics, change in appetite and fluid intake all can affect your bowels.  In order to avoid complications following surgery, here are some recommendations in order to help you during your recovery period. ° °Colace (docusate) - Pick up an over-the-counter form of Colace or another stool softener and take twice a day as long as you are requiring postoperative pain medications.  Take with a full glass of water   daily.  If you experience loose stools or diarrhea, hold the colace until you stool forms back up.  If your symptoms do not get better within 1 week or if they get worse, check with your doctor. ° °Dulcolax (bisacodyl) - Pick up over-the-counter and take as directed by the product packaging as needed to assist with the movement of your bowels.  Take with a full glass of water.  Use this product as  needed if not relieved by Colace only.  ° °MiraLax (polyethylene glycol) - Pick up over-the-counter to have on hand.  MiraLax is a solution that will increase the amount of water in your bowels to assist with bowel movements.  Take as directed and can mix with a glass of water, juice, soda, coffee, or tea.  Take if you go more than two days without a movement. °Do not use MiraLax more than once per day. Call your doctor if you are still constipated or irregular after using this medication for 7 days in a row. ° °If you continue to have problems with postoperative constipation, please contact the office for further assistance and recommendations.  If you experience "the worst abdominal pain ever" or develop nausea or vomiting, please contact the office immediatly for further recommendations for treatment. ° °ITCHING ° If you experience itching with your medications, try taking only a single pain pill, or even half a pain pill at a time.  You can also use Benadryl over the counter for itching or also to help with sleep.  ° °TED HOSE STOCKINGS °Wear the elastic stockings on both legs for three weeks following surgery during the day but you may remove then at night for sleeping. ° °MEDICATIONS °See your medication summary on the “After Visit Summary” that the nursing staff will review with you prior to discharge.  You may have some home medications which will be placed on hold until you complete the course of blood thinner medication.  It is important for you to complete the blood thinner medication as prescribed by your surgeon.  Continue your approved medications as instructed at time of discharge. ° °PRECAUTIONS °If you experience chest pain or shortness of breath - call 911 immediately for transfer to the hospital emergency department.  °If you develop a fever greater that 101 F, purulent drainage from wound, increased redness or drainage from wound, foul odor from the wound/dressing, or calf pain - CONTACT YOUR  SURGEON.   °                                                °FOLLOW-UP APPOINTMENTS °Make sure you keep all of your appointments after your operation with your surgeon and caregivers. You should call the office at the above phone number and make an appointment for approximately two weeks after the date of your surgery or on the date instructed by your surgeon outlined in the "After Visit Summary". ° ° °RANGE OF MOTION AND STRENGTHENING EXERCISES  °Rehabilitation of the knee is important following a knee injury or an operation. After just a few days of immobilization, the muscles of the thigh which control the knee become weakened and shrink (atrophy). Knee exercises are designed to build up the tone and strength of the thigh muscles and to improve knee motion. Often times heat used for twenty to thirty minutes before working out will loosen   up your tissues and help with improving the range of motion but do not use heat for the first two weeks following surgery. These exercises can be done on a training (exercise) mat, on the floor, on a table or on a bed. Use what ever works the best and is most comfortable for you Knee exercises include:  Leg Lifts - While your knee is still immobilized in a splint or cast, you can do straight leg raises. Lift the leg to 60 degrees, hold for 3 sec, and slowly lower the leg. Repeat 10-20 times 2-3 times daily. Perform this exercise against resistance later as your knee gets better.  Quad and Hamstring Sets - Tighten up the muscle on the front of the thigh (Quad) and hold for 5-10 sec. Repeat this 10-20 times hourly. Hamstring sets are done by pushing the foot backward against an object and holding for 5-10 sec. Repeat as with quad sets.   Leg Slides: Lying on your back, slowly slide your foot toward your buttocks, bending your knee up off the floor (only go as far as is comfortable). Then slowly slide your foot back down until your leg is flat on the floor again.  Angel Wings:  Lying on your back spread your legs to the side as far apart as you can without causing discomfort.  A rehabilitation program following serious knee injuries can speed recovery and prevent re-injury in the future due to weakened muscles. Contact your doctor or a physical therapist for more information on knee rehabilitation.   IF YOU ARE TRANSFERRED TO A SKILLED REHAB FACILITY If the patient is transferred to a skilled rehab facility following release from the hospital, a list of the current medications will be sent to the facility for the patient to continue.  When discharged from the skilled rehab facility, please have the facility set up the patient's [place] prior to being released. Also, the skilled facility will be responsible for providing the patient with their medications at time of release from the facility to include their pain medication, the muscle relaxants, and their blood thinner medication. If the patient is still at the rehab facility at time of the two week follow up appointment, the skilled rehab facility will also need to assist the patient in arranging follow up appointment in our office and any transportation needs.  MAKE SURE YOU:  Understand these instructions.  Get help right away if you are not doing well or get worse.    Pick up stool softner and laxative for home use following surgery while on pain medications. Do not submerge incision under water. Please use good hand washing techniques while changing dressing each day. May shower starting three days after surgery. Please use a clean towel to pat the incision dry following showers. Continue to use ice for pain and swelling after surgery. Do not use any lotions or creams on the incision until instructed by your surgeon. Information on my medicine - XARELTO (Rivaroxaban)  This medication education was reviewed with me or my healthcare representative as part of my discharge preparation.  The  pharmacist that spoke with me during my hospital stay was:  Angela [facility]  Why was Xarelto prescribed for you? Xarelto was prescribed for you to reduce the risk of blood clots forming after orthopedic surgery. The medical term for these abnormal blood clots is venous thromboembolism (VTE).  What do you need to know about xarelto ? Take your Xarelto ONCE DAILY at the  same time every day. You may take it either with or without food.  If you have difficulty swallowing the tablet whole, you may crush it and mix in applesauce just prior to taking your dose.  Take Xarelto exactly as prescribed by your doctor and DO NOT stop taking Xarelto without talking to the doctor who prescribed the medication.  Stopping without other VTE prevention medication to take the place of Xarelto may increase your risk of developing a clot.  After discharge, you should have regular check-up appointments with your healthcare provider that is prescribing your Xarelto.    What do you do if you miss a dose? If you miss a dose, take it as soon as you remember on the same day then continue your regularly scheduled once daily regimen the next day. Do not take two doses of Xarelto on the same day.   Important Safety Information A possible side effect of Xarelto is bleeding. You should call your healthcare provider right away if you experience any of the following: ? Bleeding from an injury or your nose that does not stop. ? Unusual colored urine (red or dark brown) or unusual colored stools (red or black). ? Unusual bruising for unknown reasons. ? A serious fall or if you hit your head (even if there is no bleeding).  Some medicines may interact with Xarelto and might increase your risk of bleeding while on Xarelto. To help avoid this, consult your healthcare provider or pharmacist prior to using any new prescription or non-prescription medications, including herbals, vitamins, non-steroidal  anti-inflammatory drugs (NSAIDs) and supplements.  This website has more information on Xarelto: [email or web address]

## 2015-10-24 NOTE — Progress Notes (Signed)
Physical Therapy Treatment Patient Details Name: [name] MRN: [id number] DOB: 02-07-1941 Today's Date: 10/24/2015    History of Present Illness s/p L TKA; PMHx: R TKA /manipulation    PT Comments    Progressing well; pt is very motivated to rehab L knee  Follow Up Recommendations  Home health PT;Supervision - Intermittent     Equipment Recommendations  None recommended by PT    Recommendations for Other Services       Precautions / Restrictions Precautions Precautions: Knee Precaution Comments: KI not used, pt able to perform I SLRs Knee Immobilizer - Left: Discontinue once straight leg raise with < 10 degree lag Restrictions Weight Bearing Restrictions: No Other Position/Activity Restrictions: WBAT    Mobility  Bed Mobility Overal bed mobility: Needs Assistance Bed Mobility: Supine to Sit     Supine to sit: Supervision     General bed mobility comments: for safety  Transfers Overall transfer level: Needs assistance Equipment used: Rolling walker (2 wheeled) Transfers: Sit to/from Stand Sit to Stand: Min guard;Min assist         General transfer comment: cues for hand placement and LE position  Ambulation/Gait Ambulation/Gait assistance: Min guard Ambulation Distance (Feet): 60 Feet Assistive device: Rolling walker (2 wheeled) Gait Pattern/deviations: Step-to pattern     General Gait Details: cues for sequence and RW position   Stairs            Wheelchair Mobility    Modified Rankin (Stroke Patients Only)       Balance                                    Cognition Arousal/Alertness: Awake/alert Behavior During Therapy: WFL for tasks assessed/performed Overall Cognitive Status: Within Functional Limits for tasks assessed                      Exercises Total Joint Exercises Ankle Circles/Pumps: AROM;Both;10 reps Quad Sets: 10 reps;Both;AROM;Strengthening Heel Slides: AAROM;10 reps;Left Straight Leg  Raises: AROM;Strengthening;Left;10 reps Goniometric ROM: L knee ~5-85*; R knee ~0-60*    General Comments        Pertinent Vitals/Pain Pain Assessment: 0-10 Pain Score: 5  Pain Location: L knee Pain Descriptors / Indicators: Sore Pain Intervention(s): Limited activity within patient's tolerance;Monitored during session;Premedicated before session;Ice applied    Home Living                      Prior Function            PT Goals (current goals can now be found in the care plan section) Acute Rehab PT Goals Patient Stated Goal: get her movement back in L knee PT Goal Formulation: With patient Time For Goal Achievement: 10/27/15 Potential to Achieve Goals: Good Progress towards PT goals: Progressing toward goals    Frequency  7X/week    PT Plan Current plan remains appropriate    Co-evaluation             End of Session Equipment Utilized During Treatment: Gait belt Activity Tolerance: Patient tolerated treatment well Patient left: in chair;with call bell/phone within reach (no alarm available)     Time: 6720-9470 PT Time Calculation (min) (ACUTE ONLY): 26 min  Charges:  $Gait Training: 8-22 mins $Therapeutic Exercise: 8-22 mins                    G  Codes:      Kenyon Ana 10/24/2015, 10:08 AM

## 2015-10-24 NOTE — Progress Notes (Signed)
   10/24/15 1400  PT Visit Information  Last PT Received On 10/24/15  Assistance Needed +1  History of Present Illness s/p L TKA; PMHx: R TKA /manipulation  PT Time Calculation  PT Start Time (ACUTE ONLY) 1441  PT Stop Time (ACUTE ONLY) 1456  PT Time Calculation (min) (ACUTE ONLY) 15 min  Subjective Data  Patient Stated Goal get knee moving  Precautions  Precautions Knee  Precaution Comments KI not used, pt able to perform I SLRs  Knee Immobilizer - Left Discontinue once straight leg raise with < 10 degree lag  Restrictions  Other Position/Activity Restrictions WBAT  Pain Assessment  Pain Assessment 0-10  Pain Score 6  Pain Location L knee  Pain Descriptors / Indicators Aching  Pain Intervention(s) Limited activity within patient's tolerance;Monitored during session;Ice applied  Cognition  Arousal/Alertness Awake/alert  Behavior During Therapy WFL for tasks assessed/performed  Overall Cognitive Status Within Functional Limits for tasks assessed  Bed Mobility  Overal bed mobility Needs Assistance  Bed Mobility Supine to Sit;Sit to Supine  Supine to sit Supervision  Sit to supine Supervision  General bed mobility comments for safety  Transfers  Overall transfer level Needs assistance  Equipment used Rolling walker (2 wheeled)  Transfers Sit to/from Stand  Sit to Stand Min guard;Supervision  General transfer comment cues for wt shift and hand placement  Ambulation/Gait  Ambulation/Gait assistance Supervision  Ambulation Distance (Feet) 90 Feet  Assistive device Rolling walker (2 wheeled)  Gait Pattern/deviations Step-to pattern  General Gait Details cues for sequence and RW position  Total Joint Exercises  Heel Slides AAROM;10 reps;Left (with prolonged stretch)  PT - End of Session  Equipment Utilized During Treatment Gait belt  Activity Tolerance Patient tolerated treatment well  Patient left in bed;with call bell/phone within reach;with bed alarm set  PT -  Assessment/Plan  PT Plan Current plan remains appropriate  PT Frequency (ACUTE ONLY) 7X/week  Follow Up Recommendations Supervision - Intermittent  PT equipment None recommended by PT  PT Goal Progression  Progress towards PT goals Progressing toward goals  Acute Rehab PT Goals  PT Goal Formulation With patient  Time For Goal Achievement 10/27/15  Potential to Achieve Goals Good  PT General Charges  $$ ACUTE PT VISIT 1 Procedure  PT Treatments  $Gait Training 8-22 mins

## 2015-10-25 LAB — GLUCOSE, CAPILLARY: Glucose-Capillary: 208 mg/dL — ABNORMAL HIGH (ref 65–99)

## 2015-10-25 LAB — CBC
HCT: 30 % — ABNORMAL LOW (ref 36.0–46.0)
Hemoglobin: 10.3 g/dL — ABNORMAL LOW (ref 12.0–15.0)
MCH: 30.3 pg (ref 26.0–34.0)
MCHC: 34.3 g/dL (ref 30.0–36.0)
MCV: 88.2 fL (ref 78.0–100.0)
Platelets: 190 10*3/uL (ref 150–400)
RBC: 3.4 MIL/uL — ABNORMAL LOW (ref 3.87–5.11)
RDW: 13.5 % (ref 11.5–15.5)
WBC: 10.5 10*3/uL (ref 4.0–10.5)

## 2015-10-25 LAB — BASIC METABOLIC PANEL
Anion gap: 8 (ref 5–15)
BUN: 21 mg/dL — ABNORMAL HIGH (ref 6–20)
CO2: 25 mmol/L (ref 22–32)
Calcium: 8.6 mg/dL — ABNORMAL LOW (ref 8.9–10.3)
Chloride: 106 mmol/L (ref 101–111)
Creatinine, Ser: 0.76 mg/dL (ref 0.44–1.00)
GFR calc Af Amer: >60 mL/min (ref >60)
GFR calc non Af Amer: >60 mL/min (ref >60)
Glucose, Bld: 251 mg/dL — ABNORMAL HIGH (ref 65–99)
Potassium: 4.5 mmol/L (ref 3.5–5.1)
Sodium: 139 mmol/L (ref 135–145)

## 2015-10-25 NOTE — Discharge Summary (Signed)
Physician Discharge Summary   Patient ID: [name] MRN: [id number] DOB/AGE: 1941-02-17 75 y.o.  Admit date: 10/23/2015 Discharge date: 10/25/2015  Primary Diagnosis: Primary osteoarthritis left knee   Admission Diagnoses:  Past Medical History  Diagnosis Date  . Hyperlipidemia   . 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 Diabetes mellitus ORAL AND INSULIN MEDS  . Heart palpitations SINCE 2008 TAKES BETA BLOCKER  . PONV (postoperative nausea and vomiting)   . B12 deficiency anemia   . Hypertension   . Neuropathy, diabetic ([place]) RIGHT FOOT MILD NUMBNESS  . Fibrosis of knee joint RIGHT  . Arthritis    Discharge Diagnoses:   Principal Problem:   OA (osteoarthritis) of knee  Estimated body mass index is 27.49 kg/(m^2) as calculated from the following:   Height as of this encounter: 5' 7"  (1.702 m).   Weight as of this encounter: 79.635 kg (175 lb 9 oz).  Procedure:  Procedure(s) (LRB): TOTAL LEFT KNEE ARTHROPLASTY (Left)   Consults: None  HPI: The patient is a 75 year old female who has been treated for the osteoarthritis in her left knee. She has had several injections in the past. Viscosupplementation has not been effective. She says when it wears off, she is in significant pain. She had an injection several weeks ago, and obviously is having pain again. She is now a t a point where she would like to get the knee fixed at this time.They have been treated conservatively in the past for the above stated problem and despite conservative measures, they continue to have progressive pain and severe functional limitations and dysfunction. They have failed non-operative management including home exercise, medications, and injections. It is felt that they would benefit from undergoing total joint replacement. Risks and benefits of the procedure have been discussed with the patient and  they elect to proceed with surgery. There are no active contraindications to surgery such as ongoing infection or rapidly progressive neurological disease.  Laboratory Data: Admission on 10/23/2015, Discharged on 10/25/2015  Component Date Value Ref Range Status  . Glucose-Capillary 10/23/2015 162* 65 - 99 mg/dL Final  . Glucose-Capillary 10/23/2015 198* 65 - 99 mg/dL Final  . WBC 10/24/2015 10.0  4.0 - 10.5 K/uL Final  . RBC 10/24/2015 3.49* 3.87 - 5.11 MIL/uL Final  . Hemoglobin 10/24/2015 10.1* 12.0 - 15.0 g/dL Final  . HCT 10/24/2015 30.3* 36.0 - 46.0 % Final  . MCV 10/24/2015 86.8  78.0 - 100.0 fL Final  . MCH 10/24/2015 28.9  26.0 - 34.0 pg Final  . MCHC 10/24/2015 33.3  30.0 - 36.0 g/dL Final  . RDW 10/24/2015 13.3  11.5 - 15.5 % Final  . Platelets 10/24/2015 208  150 - 400 K/uL Final  . Sodium 10/24/2015 137  135 - 145 mmol/L Final  . Potassium 10/24/2015 4.8  3.5 - 5.1 mmol/L Final  . Chloride 10/24/2015 104  101 - 111 mmol/L Final  . CO2 10/24/2015 26  22 - 32 mmol/L Final  . Glucose, Bld 10/24/2015 271* 65 - 99 mg/dL Final  . BUN 10/24/2015 17  6 - 20 mg/dL Final  . Creatinine, Ser 10/24/2015 0.73  0.44 - 1.00 mg/dL Final  . Calcium 10/24/2015 8.3* 8.9 - 10.3 mg/dL Final  . GFR calc non Af Amer 10/24/2015 >60  >60 mL/min Final  . GFR calc Af Amer 10/24/2015 >60  >  60 mL/min Final   Comment: (NOTE) The eGFR has been calculated using the CKD EPI equation. This calculation has not been validated in all clinical situations. eGFR's persistently <60 mL/min signify possible Chronic Kidney Disease.   . Anion gap 10/24/2015 7  5 - 15 Final  . Glucose-Capillary 10/23/2015 280* 65 - 99 mg/dL Final  . WBC 10/25/2015 10.5  4.0 - 10.5 K/uL Final  . RBC 10/25/2015 3.40* 3.87 - 5.11 MIL/uL Final  . Hemoglobin 10/25/2015 10.3* 12.0 - 15.0 g/dL Final  . HCT 10/25/2015 30.0* 36.0 - 46.0 % Final  . MCV 10/25/2015 88.2  78.0 - 100.0 fL Final  . MCH 10/25/2015 30.3  26.0 - 34.0 pg Final    . MCHC 10/25/2015 34.3  30.0 - 36.0 g/dL Final  . RDW 10/25/2015 13.5  11.5 - 15.5 % Final  . Platelets 10/25/2015 190  150 - 400 K/uL Final  . Sodium 10/25/2015 139  135 - 145 mmol/L Final  . Potassium 10/25/2015 4.5  3.5 - 5.1 mmol/L Final  . Chloride 10/25/2015 106  101 - 111 mmol/L Final  . CO2 10/25/2015 25  22 - 32 mmol/L Final  . Glucose, Bld 10/25/2015 251* 65 - 99 mg/dL Final  . BUN 10/25/2015 21* 6 - 20 mg/dL Final  . Creatinine, Ser 10/25/2015 0.76  0.44 - 1.00 mg/dL Final  . Calcium 10/25/2015 8.6* 8.9 - 10.3 mg/dL Final  . GFR calc non Af Amer 10/25/2015 >60  >60 mL/min Final  . GFR calc Af Amer 10/25/2015 >60  >60 mL/min Final   Comment: (NOTE) The eGFR has been calculated using the CKD EPI equation. This calculation has not been validated in all clinical situations. eGFR's persistently <60 mL/min signify possible Chronic Kidney Disease.   . Anion gap 10/25/2015 8  5 - 15 Final  . Glucose-Capillary 10/24/2015 213* 65 - 99 mg/dL Final  . Glucose-Capillary 10/24/2015 180* 65 - 99 mg/dL Final  . Glucose-Capillary 10/24/2015 276* 65 - 99 mg/dL Final  . Glucose-Capillary 10/24/2015 254* 65 - 99 mg/dL Final  . Glucose-Capillary 10/25/2015 208* 65 - 99 mg/dL Final  . Comment 1 10/25/2015 Notify RN   Final  . Comment 2 10/25/2015 Document in [facility] Outpatient Visit on 10/16/2015  Component Date Value Ref Range Status  . MRSA, PCR 10/16/2015 NEGATIVE  NEGATIVE Final  . Staphylococcus aureus 10/16/2015 NEGATIVE  NEGATIVE Final   Comment:        The Xpert SA Assay (FDA approved for NASAL specimens in patients over 53 years of age), is one component of a comprehensive surveillance program.  Test performance has been validated by [facility] for patients greater than or equal to 39 year old. It is not intended to diagnose infection nor to guide or monitor treatment.   Marland Kitchen aPTT 10/16/2015 29  24 - 37 seconds Final  . WBC 10/16/2015 7.3  4.0 - 10.5 K/uL  Final  . RBC 10/16/2015 4.31  3.87 - 5.11 MIL/uL Final  . Hemoglobin 10/16/2015 12.6  12.0 - 15.0 g/dL Final  . HCT 10/16/2015 38.8  36.0 - 46.0 % Final  . MCV 10/16/2015 90.0  78.0 - 100.0 fL Final  . MCH 10/16/2015 29.2  26.0 - 34.0 pg Final  . MCHC 10/16/2015 32.5  30.0 - 36.0 g/dL Final  . RDW 10/16/2015 13.8  11.5 - 15.5 % Final  . Platelets 10/16/2015 285  150 - 400 K/uL Final  . Sodium 10/16/2015 139  135 -  145 mmol/L Final  . Potassium 10/16/2015 4.4  3.5 - 5.1 mmol/L Final  . Chloride 10/16/2015 105  101 - 111 mmol/L Final  . CO2 10/16/2015 27  22 - 32 mmol/L Final  . Glucose, Bld 10/16/2015 93  65 - 99 mg/dL Final  . BUN 10/16/2015 18  6 - 20 mg/dL Final  . Creatinine, Ser 10/16/2015 0.73  0.44 - 1.00 mg/dL Final  . Calcium 10/16/2015 9.3  8.9 - 10.3 mg/dL Final  . Total Protein 10/16/2015 6.5  6.5 - 8.1 g/dL Final  . Albumin 10/16/2015 3.7  3.5 - 5.0 g/dL Final  . AST 10/16/2015 21  15 - 41 U/L Final  . ALT 10/16/2015 22  14 - 54 U/L Final  . Alkaline Phosphatase 10/16/2015 43  38 - 126 U/L Final  . Total Bilirubin 10/16/2015 0.8  0.3 - 1.2 mg/dL Final  . GFR calc non Af Amer 10/16/2015 >60  >60 mL/min Final  . GFR calc Af Amer 10/16/2015 >60  >60 mL/min Final   Comment: (NOTE) The eGFR has been calculated using the CKD EPI equation. This calculation has not been validated in all clinical situations. eGFR's persistently <60 mL/min signify possible Chronic Kidney Disease.   . Anion gap 10/16/2015 7  5 - 15 Final  . Prothrombin Time 10/16/2015 13.2  11.6 - 15.2 seconds Final  . INR 10/16/2015 1.02  0.00 - 1.49 Final  . ABO/RH(D) 10/16/2015 A POS   Final  . Antibody Screen 10/16/2015 NEG   Final  . Sample Expiration 10/16/2015 10/26/2015   Final  . Extend sample reason 10/16/2015 NO TRANSFUSIONS OR PREGNANCY IN THE PAST 3 MONTHS   Final  . Hgb A1c MFr Bld 10/16/2015 7.1* 4.8 - 5.6 % Final   Comment: (NOTE)         Pre-diabetes: 5.7 - 6.4         Diabetes: >6.4          Glycemic control for adults with diabetes: <7.0   . Mean Plasma Glucose 10/16/2015 157   Final   Comment: (NOTE) Performed At: [facility] [place], [place] [id number] Lindon Romp MD JF:[id number]   . Color, Urine 10/16/2015 YELLOW  YELLOW Final  . APPearance 10/16/2015 CLEAR  CLEAR Final  . Specific Gravity, Urine 10/16/2015 1.011  1.005 - 1.030 Final  . pH 10/16/2015 5.0  5.0 - 8.0 Final  . Glucose, UA 10/16/2015 NEGATIVE  NEGATIVE mg/dL Final  . Hgb urine dipstick 10/16/2015 NEGATIVE  NEGATIVE Final  . Bilirubin Urine 10/16/2015 NEGATIVE  NEGATIVE Final  . Ketones, ur 10/16/2015 NEGATIVE  NEGATIVE mg/dL Final  . Protein, ur 10/16/2015 NEGATIVE  NEGATIVE mg/dL Final  . Nitrite 10/16/2015 NEGATIVE  NEGATIVE Final  . Leukocytes, UA 10/16/2015 NEGATIVE  NEGATIVE Final   MICROSCOPIC NOT DONE ON URINES WITH NEGATIVE PROTEIN, BLOOD, LEUKOCYTES, NITRITE, OR GLUCOSE <1000 mg/dL.     X-Rays:No results found.  EKG: Orders placed or performed in visit on 08/25/15  . EKG 12-Lead     Hospital Course: [name] is a 75 y.o. who was admitted to [facility]. They were brought to the operating room on 10/23/2015 and underwent Procedure(s): TOTAL LEFT KNEE ARTHROPLASTY.  Patient tolerated the procedure well and was later transferred to the recovery room and then to the orthopaedic floor for postoperative care.  They were given PO and IV analgesics for pain control following their surgery.  They were given 24 hours of postoperative antibiotics  of  Anti-infectives    Start     Dose/Rate Route Frequency Ordered Stop   10/23/15 1400  ceFAZolin (ANCEF) IVPB 2g/100 mL premix     2 g 200 mL/hr over 30 Minutes Intravenous Every 6 hours 10/23/15 1049 10/23/15 2018   10/23/15 0600  ceFAZolin (ANCEF) IVPB 2g/100 mL premix     2 g 200 mL/hr over 30 Minutes Intravenous On call to O.R. 10/23/15 0600 10/23/15 0708     and started on DVT prophylaxis in the form of  Xarelto.   PT and OT were ordered for total joint protocol.  Discharge planning consulted to help with postop disposition and equipment needs.  Patient had a fair night on the evening of surgery.  She was switched from Oxycodone to Tramadol because she could no tolerate the Oxycodone. They started to get up OOB with therapy on day one. Hemovac drain was pulled without difficulty.  Continued to work with therapy into day two.  Dressing was changed on day two and the incision was clean and dry. The patient had progressed with therapy and meeting their goals.  Incision was healing well.  Patient was seen in rounds and was ready to go home.   Diet: Cardiac diet Activity:WBAT Follow-up:in 2 weeks Disposition - Home Discharged Condition: stable   Discharge Instructions    Call MD / Call 911    Complete by:  As directed   If you experience chest pain or shortness of breath, CALL 911 and be transported to the hospital emergency room.  If you develope a fever above 101 F, pus (white drainage) or increased drainage or redness at the wound, or calf pain, call your surgeon's office.     Constipation Prevention    Complete by:  As directed   Drink plenty of fluids.  Prune juice may be helpful.  You may use a stool softener, such as Colace (over the counter) 100 mg twice a day.  Use MiraLax (over the counter) for constipation as needed.     Diet - low sodium heart healthy    Complete by:  As directed      Diet Carb Modified    Complete by:  As directed      Discharge instructions    Complete by:  As directed   Dr. [name] Total Joint Specialist [facility] [address], [place], [place] [address] [phone]  TOTAL KNEE REPLACEMENT POSTOPERATIVE DIRECTIONS  Knee Rehabilitation, Guidelines Following Surgery  Results after knee surgery are often greatly improved when you follow the exercise, range of motion and muscle strengthening exercises prescribed by your doctor.  Safety measures are also important to protect the knee from further injury. Any time any of these exercises cause you to have increased pain or swelling in your knee joint, decrease the amount until you are comfortable again and slowly increase them. If you have problems or questions, call your caregiver or physical therapist for advice.   HOME CARE INSTRUCTIONS  Remove items at home which could result in a fall. This includes throw rugs or furniture in walking pathways.  ICE to the affected knee every three hours for 30 minutes at a time and then as needed for pain and swelling.  Continue to use ice on the knee for pain and swelling from surgery. You may notice swelling that will progress down to the foot and ankle.  This is normal after surgery.  Elevate the leg when you are not up walking on  it.   Continue to use the breathing machine which will help keep your temperature down.  It is common for your temperature to cycle up and down following surgery, especially at night when you are not up moving around and exerting yourself.  The breathing machine keeps your lungs expanded and your temperature down. Do not place pillow under knee, focus on keeping the knee straight while resting  DIET You may resume your previous home diet once your are discharged from the hospital.  DRESSING / WOUND CARE / SHOWERING You may start showering once you are discharged home but do not submerge the incision under water. Just pat the incision dry and apply a dry gauze dressing on daily. Change the surgical dressing daily and reapply a dry dressing each time.  ACTIVITY Walk with your walker as instructed. Use walker as long as suggested by your caregivers. Avoid periods of inactivity such as sitting longer than an hour when not asleep. This helps prevent blood clots.  You may resume a sexual relationship in one month or when given the OK by your doctor.  You may return to work once you are cleared by your doctor.    Do not drive a car for 6 weeks or until released by you surgeon.  Do not drive while taking narcotics.  WEIGHT BEARING Weight bearing as tolerated with assist device (walker, cane, etc) as directed, use it as long as suggested by your surgeon or therapist, typically at least 4-6 weeks.  POSTOPERATIVE CONSTIPATION PROTOCOL Constipation - defined medically as fewer than three stools per week and severe constipation as less than one stool per week.  One of the most common issues patients have following surgery is constipation.  Even if you have a regular bowel pattern at home, your normal regimen is likely to be disrupted due to multiple reasons following surgery.  Combination of anesthesia, postoperative narcotics, change in appetite and fluid intake all can affect your bowels.  In order to avoid complications following surgery, here are some recommendations in order to help you during your recovery period.  Colace (docusate) - Pick up an over-the-counter form of Colace or another stool softener and take twice a day as long as you are requiring postoperative pain medications.  Take with a full glass of water daily.  If you experience loose stools or diarrhea, hold the colace until you stool forms back up.  If your symptoms do not get better within 1 week or if they get worse, check with your doctor.  Dulcolax (bisacodyl) - Pick up over-the-counter and take as directed by the product packaging as needed to assist with the movement of your bowels.  Take with a full glass of water.  Use this product as needed if not relieved by Colace only.   MiraLax (polyethylene glycol) - Pick up over-the-counter to have on hand.  MiraLax is a solution that will increase the amount of water in your bowels to assist with bowel movements.  Take as directed and can mix with a glass of water, juice, soda, coffee, or tea.  Take if you go more than two days without a movement. Do not use MiraLax more than once per day. Call  your doctor if you are still constipated or irregular after using this medication for 7 days in a row.  If you continue to have problems with postoperative constipation, please contact the office for further assistance and recommendations.  If you experience "the worst abdominal pain ever" or develop nausea  or vomiting, please contact the office immediatly for further recommendations for treatment.  ITCHING  If you experience itching with your medications, try taking only a single pain pill, or even half a pain pill at a time.  You can also use Benadryl over the counter for itching or also to help with sleep.   TED HOSE STOCKINGS Wear the elastic stockings on both legs for three weeks following surgery during the day but you may remove then at night for sleeping.  MEDICATIONS See your medication summary on the "After Visit Summary" that the nursing staff will review with you prior to discharge.  You may have some home medications which will be placed on hold until you complete the course of blood thinner medication.  It is important for you to complete the blood thinner medication as prescribed by your surgeon.  Continue your approved medications as instructed at time of discharge.  PRECAUTIONS If you experience chest pain or shortness of breath - call 911 immediately for transfer to the hospital emergency department.  If you develop a fever greater that 101 F, purulent drainage from wound, increased redness or drainage from wound, foul odor from the wound/dressing, or calf pain - CONTACT YOUR SURGEON.                                                   FOLLOW-UP APPOINTMENTS Make sure you keep all of your appointments after your operation with your surgeon and caregivers. You should call the office at the above phone number and make an appointment for approximately two weeks after the date of your surgery or on the date instructed by your surgeon outlined in the "After Visit Summary".   RANGE OF  MOTION AND STRENGTHENING EXERCISES  Rehabilitation of the knee is important following a knee injury or an operation. After just a few days of immobilization, the muscles of the thigh which control the knee become weakened and shrink (atrophy). Knee exercises are designed to build up the tone and strength of the thigh muscles and to improve knee motion. Often times heat used for twenty to thirty minutes before working out will loosen up your tissues and help with improving the range of motion but do not use heat for the first two weeks following surgery. These exercises can be done on a training (exercise) mat, on the floor, on a table or on a bed. Use what ever works the best and is most comfortable for you Knee exercises include:  Leg Lifts - While your knee is still immobilized in a splint or cast, you can do straight leg raises. Lift the leg to 60 degrees, hold for 3 sec, and slowly lower the leg. Repeat 10-20 times 2-3 times daily. Perform this exercise against resistance later as your knee gets better.  Quad and Hamstring Sets - Tighten up the muscle on the front of the thigh (Quad) and hold for 5-10 sec. Repeat this 10-20 times hourly. Hamstring sets are done by pushing the foot backward against an object and holding for 5-10 sec. Repeat as with quad sets.  Leg Slides: Lying on your back, slowly slide your foot toward your buttocks, bending your knee up off the floor (only go as far as is comfortable). Then slowly slide your foot back down until your leg is flat on the floor again. Angel Wings:  Lying on your back spread your legs to the side as far apart as you can without causing discomfort.  A rehabilitation program following serious knee injuries can speed recovery and prevent re-injury in the future due to weakened muscles. Contact your doctor or a physical therapist for more information on knee rehabilitation.   IF YOU ARE TRANSFERRED TO A SKILLED REHAB FACILITY If the patient is transferred to a  skilled rehab facility following release from the hospital, a list of the current medications will be sent to the facility for the patient to continue.  When discharged from the skilled rehab facility, please have the facility set up the patient's [place] prior to being released. Also, the skilled facility will be responsible for providing the patient with their medications at time of release from the facility to include their pain medication, the muscle relaxants, and their blood thinner medication. If the patient is still at the rehab facility at time of the two week follow up appointment, the skilled rehab facility will also need to assist the patient in arranging follow up appointment in our office and any transportation needs.  MAKE SURE YOU:  Understand these instructions.  Get help right away if you are not doing well or get worse.    Pick up stool softner and laxative for home use following surgery while on pain medications. Do not submerge incision under water. Please use good hand washing techniques while changing dressing each day. May shower starting three days after surgery. Please use a clean towel to pat the incision dry following showers. Continue to use ice for pain and swelling after surgery. Do not use any lotions or creams on the incision until instructed by your surgeon. Information on my medicine - XARELTO (Rivaroxaban)  This medication education was reviewed with me or my healthcare representative as part of my discharge preparation.  The pharmacist that spoke with me during my hospital stay was:  [facility]  Why was Xarelto prescribed for you? Xarelto was prescribed for you to reduce the risk of blood clots forming after orthopedic surgery. The medical term for these abnormal blood clots is venous thromboembolism (VTE).  What do you need to know about xarelto ? Take your Xarelto ONCE DAILY at the same time every day. You may take it  either with or without food.  If you have difficulty swallowing the tablet whole, you may crush it and mix in applesauce just prior to taking your dose.  Take Xarelto exactly as prescribed by your doctor and DO NOT stop taking Xarelto without talking to the doctor who prescribed the medication.  Stopping without other VTE prevention medication to take the place of Xarelto may increase your risk of developing a clot.  After discharge, you should have regular check-up appointments with your healthcare provider that is prescribing your Xarelto.    What do you do if you miss a dose? If you miss a dose, take it as soon as you remember on the same day then continue your regularly scheduled once daily regimen the next day. Do not take two doses of Xarelto on the same day.   Important Safety Information A possible side effect of Xarelto is bleeding. You should call your healthcare provider right away if you experience any of the following: Bleeding from an injury or your nose that does not stop. Unusual colored urine (red or dark brown) or unusual colored stools (red or black). Unusual bruising for unknown reasons. A  serious fall or if you hit your head (even if there is no bleeding).  Some medicines may interact with Xarelto and might increase your risk of bleeding while on Xarelto. To help avoid this, consult your healthcare provider or pharmacist prior to using any new prescription or non-prescription medications, including herbals, vitamins, non-steroidal anti-inflammatory drugs (NSAIDs) and supplements.  This website has more information on Xarelto: [email or web address]     Increase activity slowly as tolerated    Complete by:  As directed             Medication List    STOP taking these medications        aspirin 81 MG tablet     ibuprofen 200 MG tablet  Commonly known as:  ADVIL,MOTRIN     OPTIMAL D3 M PO     OPTIMAL-D PO     OVER THE COUNTER MEDICATION     VITAMIN B-12  IJ      TAKE these medications        cetirizine 10 MG tablet  Commonly known as:  ZYRTEC  Take 10 mg by mouth daily.     estradiol 0.1 MG/GM vaginal cream  Commonly known as:  ESTRACE  Place 1 Applicatorful vaginally daily as needed (Irritation or dryness).     estradiol 0.5 MG tablet  Commonly known as:  ESTRACE  Take 0.5 mg by mouth daily.     furosemide 40 MG tablet  Commonly known as:  LASIX  Take 20 mg by mouth daily.     insulin glargine 100 UNIT/ML injection  Commonly known as:  LANTUS  Inject 40 Units into the skin every morning.     JANUMET 50-500 MG tablet  Generic drug:  sitaGLIPtin-metformin  Take 1 tablet by mouth 2 (two) times daily.     lisinopril 5 MG tablet  Commonly known as:  PRINIVIL,ZESTRIL  Take 2.5 mg by mouth daily.     methocarbamol 500 MG tablet  Commonly known as:  ROBAXIN  Take 1 tablet (500 mg total) by mouth every 6 (six) hours as needed for muscle spasms.     metoprolol succinate 50 MG 24 hr tablet  Commonly known as:  TOPROL-XL  Take 50 mg by mouth daily. Take with or immediately following a meal.     rivaroxaban 10 MG Tabs tablet  Commonly known as:  XARELTO  Take 1 tablet (10 mg total) by mouth daily with breakfast.     rosuvastatin 40 MG tablet  Commonly known as:  CRESTOR  Take 10 mg by mouth daily.     tobramycin-dexamethasone ophthalmic solution  Commonly known as:  TOBRADEX  Place 1 drop into the left eye daily as needed. (redness)     traMADol 50 MG tablet  Commonly known as:  ULTRAM  Take 1-2 tablets (50-100 mg total) by mouth every 6 (six) hours as needed for moderate pain.           Follow-up Information    Follow up with [name], MD. Schedule an appointment as soon as possible for a visit on 11/07/2015.   Specialty:  Orthopedic Surgery   Why:  Call [phone] tomorrow to make the appointment   Contact information:   [address] [place] [address] [phone]        Signed: Ardeen Jourdain, PA-C Orthopaedic Surgery 10/25/2015, 3:12 PM

## 2015-10-25 NOTE — Progress Notes (Signed)
Physical Therapy Treatment Patient Details Name: [name] MRN: [id number] DOB: 04/14/1941 Today's Date: 10/25/2015    History of Present Illness s/p L TKA; PMHx: R TKA /manipulation    PT Comments    Making excellent progress; much improved L knee flexion today (93 degrees)  Follow Up Recommendations  Supervision - Intermittent (virtual therapy)     Equipment Recommendations  None recommended by PT    Recommendations for Other Services       Precautions / Restrictions Precautions Precautions: Knee Precaution Comments: KI not used, pt able to perform I SLRs Required Braces or Orthoses: Knee Immobilizer - Left Knee Immobilizer - Left: Discontinue once straight leg raise with < 10 degree lag Restrictions LLE Weight Bearing: Weight bearing as tolerated Other Position/Activity Restrictions: WBAT    Mobility  Bed Mobility               General bed mobility comments: pt in chair  Transfers Overall transfer level: Needs assistance Equipment used: Rolling walker (2 wheeled) Transfers: Sit to/from Stand Sit to Stand: Supervision         General transfer comment: cues for wt shift and hand placement, pt self corrects today  Ambulation/Gait Ambulation/Gait assistance: Supervision Ambulation Distance (Feet): 150 Feet Assistive device: Rolling walker (2 wheeled) Gait Pattern/deviations: Step-through pattern Gait velocity: 34.5 Gait velocity interpretation: <1.8 ft/sec, indicative of risk for recurrent falls General Gait Details: cues for gait progression   Stairs Stairs:  (reviewed up/down "threshold-small step"-pt declines practice)          Wheelchair Mobility    Modified Rankin (Stroke Patients Only)       Balance                                    Cognition Arousal/Alertness: Awake/alert Behavior During Therapy: WFL for tasks assessed/performed Overall Cognitive Status: Within Functional Limits for tasks assessed                       Exercises Total Joint Exercises Ankle Circles/Pumps: AROM;Both;10 reps Quad Sets: 10 reps;Both;AROM;Strengthening Short Arc Quad: AROM;Strengthening;Left;10 reps Heel Slides: AAROM;10 reps;Left Hip ABduction/ADduction: Strengthening;Left;10 reps Straight Leg Raises: AROM;Strengthening;Left;10 reps Goniometric ROM: L knee 11-93* flexion performed insitting    General Comments        Pertinent Vitals/Pain Pain Assessment: 0-10 Pain Score: 4  Pain Location: L knee Pain Descriptors / Indicators: Aching Pain Intervention(s): Limited activity within patient's tolerance;Monitored during session;Premedicated before session;Ice applied    Home Living                      Prior Function            PT Goals (current goals can now be found in the care plan section) Acute Rehab PT Goals Patient Stated Goal: get knee moving PT Goal Formulation: With patient Time For Goal Achievement: 10/27/15 Potential to Achieve Goals: Good Progress towards PT goals: Progressing toward goals    Frequency  7X/week    PT Plan Current plan remains appropriate    Co-evaluation             End of Session   Activity Tolerance: Patient tolerated treatment well Patient left: in chair;with call bell/phone within reach     Time: 0920-0949 PT Time Calculation (min) (ACUTE ONLY): 29 min  Charges:  $Gait Training: 8-22 mins $Therapeutic Exercise: 8-22 mins  G CodesKenyon Ana 10/25/2015, 10:42 AM

## 2015-10-25 NOTE — Progress Notes (Signed)
   Subjective: 2 Days Post-Op Procedure(s) (LRB): TOTAL LEFT KNEE ARTHROPLASTY (Left) Patient reports pain as mild.   Patient seen in rounds for Dr. Wynelle Link. Patient is well, and has had no acute complaints or problems. She reports that she is feeling good on the Tramadol. Voiding well. Postitive flatus.  Plan is to go Home after hospital stay.  Objective: Vital signs in last 24 hours: Temp:  [97.5 F (36.4 C)-98.4 F (36.9 C)] 98.1 F (36.7 C) (04/19 0620) Pulse Rate:  [60-73] 73 (04/19 0620) Resp:  [16-18] 18 (04/19 0620) BP: (104-142)/(53-64) 134/64 mmHg (04/19 0620) SpO2:  [94 %-97 %] 94 % (04/19 0620)  Intake/Output from previous day:  Intake/Output Summary (Last 24 hours) at 10/25/15 0734 Last data filed at 10/25/15 0030  Gross per 24 hour  Intake  937.5 ml  Output   2050 ml  Net -1112.5 ml     Labs:  Recent Labs  10/24/15 0400 10/25/15 0401  HGB 10.1* 10.3*    Recent Labs  10/24/15 0400 10/25/15 0401  WBC 10.0 10.5  RBC 3.49* 3.40*  HCT 30.3* 30.0*  PLT 208 190    Recent Labs  10/24/15 0400 10/25/15 0401  NA 137 139  K 4.8 4.5  CL 104 106  CO2 26 25  BUN 17 21*  CREATININE 0.73 0.76  GLUCOSE 271* 251*  CALCIUM 8.3* 8.6*    EXAM General - Patient is Alert and Oriented Extremity - Neurologically intact Intact pulses distally Dorsiflexion/Plantar flexion intact Compartment soft Dressing/Incision - clean, dry, no drainage Motor Function - intact, moving foot and toes well on exam.   Past Medical History  Diagnosis Date  . Hyperlipidemia   . 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 Diabetes mellitus ORAL AND INSULIN MEDS  . Heart palpitations SINCE 2008 TAKES BETA BLOCKER  . PONV (postoperative nausea and vomiting)   . B12 deficiency anemia   . Hypertension   . Neuropathy, diabetic ([place]) RIGHT FOOT MILD NUMBNESS  . Fibrosis of  knee joint RIGHT  . Arthritis     Assessment/Plan: 2 Days Post-Op Procedure(s) (LRB): TOTAL LEFT KNEE ARTHROPLASTY (Left) Principal Problem:   OA (osteoarthritis) of knee  Estimated body mass index is 27.49 kg/(m^2) as calculated from the following:   Height as of this encounter: '5\' 7"'$  (1.702 m).   Weight as of this encounter: 79.635 kg (175 lb 9 oz). Advance diet Up with therapy Discharge home with virtual therapy  DVT Prophylaxis - Xarelto Weight-Bearing as tolerated   Ardeen Jourdain, PA-C Orthopaedic Surgery 10/25/2015, 7:34 AM

## 2015-10-26 LAB — GLUCOSE, CAPILLARY: Glucose-Capillary: 265 mg/dL — ABNORMAL HIGH (ref 65–99)

## 2015-12-05 ENCOUNTER — Other Ambulatory Visit: Payer: Self-pay | Admitting: Internal Medicine

## 2015-12-05 DIAGNOSIS — N63 Unspecified lump in unspecified breast: Secondary | ICD-10-CM

## 2015-12-13 ENCOUNTER — Ambulatory Visit
Admission: RE | Admit: 2015-12-13 | Discharge: 2015-12-13 | Disposition: A | Discharge status: Home / Self Care | Payer: Medicare Other | Source: Ambulatory Visit | Attending: Internal Medicine | Admitting: Internal Medicine

## 2015-12-13 ENCOUNTER — Other Ambulatory Visit: Payer: Self-pay | Admitting: Internal Medicine

## 2015-12-13 DIAGNOSIS — N63 Unspecified lump in unspecified breast: Secondary | ICD-10-CM

## 2016-06-18 ENCOUNTER — Other Ambulatory Visit: Payer: Self-pay | Admitting: Internal Medicine

## 2016-06-18 DIAGNOSIS — Z853 Personal history of malignant neoplasm of breast: Secondary | ICD-10-CM

## 2016-06-18 DIAGNOSIS — N6310 Unspecified lump in the right breast, unspecified quadrant: Secondary | ICD-10-CM

## 2016-07-02 ENCOUNTER — Ambulatory Visit
Admission: RE | Admit: 2016-07-02 | Discharge: 2016-07-02 | Disposition: A | Discharge status: Home / Self Care | Payer: Medicare Other | Source: Ambulatory Visit | Attending: Internal Medicine | Admitting: Internal Medicine

## 2016-07-02 DIAGNOSIS — N6310 Unspecified lump in the right breast, unspecified quadrant: Secondary | ICD-10-CM

## 2016-07-02 DIAGNOSIS — Z853 Personal history of malignant neoplasm of breast: Secondary | ICD-10-CM

## 2017-08-04 ENCOUNTER — Other Ambulatory Visit: Payer: Self-pay | Admitting: Internal Medicine

## 2017-08-04 DIAGNOSIS — Z139 Encounter for screening, unspecified: Secondary | ICD-10-CM

## 2017-08-21 ENCOUNTER — Ambulatory Visit
Admission: RE | Admit: 2017-08-21 | Discharge: 2017-08-21 | Disposition: A | Discharge status: Home / Self Care | Payer: Medicare Other | Source: Ambulatory Visit | Attending: Internal Medicine | Admitting: Internal Medicine

## 2017-08-21 DIAGNOSIS — Z139 Encounter for screening, unspecified: Secondary | ICD-10-CM

## 2018-10-05 ENCOUNTER — Other Ambulatory Visit: Payer: Self-pay | Admitting: Internal Medicine

## 2018-10-05 DIAGNOSIS — Z1231 Encounter for screening mammogram for malignant neoplasm of breast: Secondary | ICD-10-CM

## 2018-11-26 ENCOUNTER — Other Ambulatory Visit: Payer: Self-pay

## 2018-11-26 ENCOUNTER — Ambulatory Visit
Admission: RE | Admit: 2018-11-26 | Discharge: 2018-11-26 | Disposition: A | Discharge status: Home / Self Care | Payer: Medicare Other | Source: Ambulatory Visit | Attending: Internal Medicine | Admitting: Internal Medicine

## 2018-11-26 ENCOUNTER — Ambulatory Visit: Payer: Medicare Other

## 2018-11-26 DIAGNOSIS — Z1231 Encounter for screening mammogram for malignant neoplasm of breast: Secondary | ICD-10-CM

## 2019-06-05 ENCOUNTER — Emergency Department (HOSPITAL_BASED_OUTPATIENT_CLINIC_OR_DEPARTMENT_OTHER): Payer: Medicare Other

## 2019-06-05 ENCOUNTER — Encounter (HOSPITAL_BASED_OUTPATIENT_CLINIC_OR_DEPARTMENT_OTHER): Payer: Self-pay

## 2019-06-05 ENCOUNTER — Emergency Department (HOSPITAL_BASED_OUTPATIENT_CLINIC_OR_DEPARTMENT_OTHER)
Admission: EM | Admit: 2019-06-05 | Discharge: 2019-06-05 | Disposition: A | Discharge status: Home / Self Care | Payer: Medicare Other | Attending: Emergency Medicine | Admitting: Emergency Medicine

## 2019-06-05 ENCOUNTER — Other Ambulatory Visit: Payer: Self-pay

## 2019-06-05 DIAGNOSIS — Z79899 Other long term (current) drug therapy: Secondary | ICD-10-CM | POA: Diagnosis not present

## 2019-06-05 DIAGNOSIS — E11649 Type 2 diabetes mellitus with hypoglycemia without coma: Secondary | ICD-10-CM | POA: Insufficient documentation

## 2019-06-05 DIAGNOSIS — E162 Hypoglycemia, unspecified: Secondary | ICD-10-CM

## 2019-06-05 DIAGNOSIS — Z7901 Long term (current) use of anticoagulants: Secondary | ICD-10-CM | POA: Diagnosis not present

## 2019-06-05 DIAGNOSIS — Z794 Long term (current) use of insulin: Secondary | ICD-10-CM | POA: Diagnosis not present

## 2019-06-05 DIAGNOSIS — E1140 Type 2 diabetes mellitus with diabetic neuropathy, unspecified: Secondary | ICD-10-CM | POA: Diagnosis not present

## 2019-06-05 LAB — COMPREHENSIVE METABOLIC PANEL
ALT: 17 U/L (ref 0–44)
AST: 19 U/L (ref 15–41)
Albumin: 3.5 g/dL (ref 3.5–5.0)
Alkaline Phosphatase: 45 U/L (ref 38–126)
Anion gap: 10 (ref 5–15)
BUN: 16 mg/dL (ref 8–23)
CO2: 23 mmol/L (ref 22–32)
Calcium: 8.9 mg/dL (ref 8.9–10.3)
Chloride: 106 mmol/L (ref 98–111)
Creatinine, Ser: 0.88 mg/dL (ref 0.44–1.00)
GFR calc Af Amer: >60 mL/min (ref >60)
GFR calc non Af Amer: >60 mL/min (ref >60)
Glucose, Bld: 111 mg/dL — ABNORMAL HIGH (ref 70–99)
Potassium: 3.6 mmol/L (ref 3.5–5.1)
Sodium: 139 mmol/L (ref 135–145)
Total Bilirubin: 0.5 mg/dL (ref 0.3–1.2)
Total Protein: 6.5 g/dL (ref 6.5–8.1)

## 2019-06-05 LAB — CBC
HCT: 39.9 % (ref 36.0–46.0)
Hemoglobin: 12.8 g/dL (ref 12.0–15.0)
MCH: 29.3 pg (ref 26.0–34.0)
MCHC: 32.1 g/dL (ref 30.0–36.0)
MCV: 91.3 fL (ref 80.0–100.0)
Platelets: 233 10*3/uL (ref 150–400)
RBC: 4.37 MIL/uL (ref 3.87–5.11)
RDW: 13.9 % (ref 11.5–15.5)
WBC: 9.1 10*3/uL (ref 4.0–10.5)
nRBC: 0 % (ref 0.0–0.2)

## 2019-06-05 LAB — CBG MONITORING, ED
Glucose-Capillary: 81 mg/dL (ref 70–99)
Glucose-Capillary: 97 mg/dL (ref 70–99)
Glucose-Capillary: 80 mg/dL (ref 70–99)

## 2019-06-05 LAB — URINALYSIS, ROUTINE W REFLEX MICROSCOPIC
Bilirubin Urine: NEGATIVE
Glucose, UA: NEGATIVE mg/dL
Hgb urine dipstick: NEGATIVE
Ketones, ur: NEGATIVE mg/dL
Leukocytes,Ua: NEGATIVE
Nitrite: NEGATIVE
Protein, ur: NEGATIVE mg/dL
Specific Gravity, Urine: 1.02 (ref 1.005–1.030)
pH: 5.5 (ref 5.0–8.0)

## 2019-06-05 MED ORDER — SODIUM CHLORIDE 0.9% FLUSH
3.0000 mL | INTRAVENOUS | Status: DC | PRN
  Filled 2019-06-05: qty 3

## 2019-06-05 MED ORDER — SODIUM CHLORIDE 0.9 % IV SOLN: 250.0000 mL | INTRAVENOUS | Status: DC | PRN

## 2019-06-05 MED ORDER — SODIUM CHLORIDE 0.9% FLUSH
3.0000 mL | Freq: Two times a day (BID) | INTRAVENOUS | Status: DC
  Administered 2019-06-05: 3 mL via INTRAVENOUS
  Filled 2019-06-05: qty 3

## 2019-06-05 NOTE — ED Notes (Signed)
Pt states that she does not ned an X Ray of her right hand, she is able to move it and is not tender to touch, does have a small bruise to index finger knuckle. MD made aware.

## 2019-06-05 NOTE — ED Provider Notes (Signed)
[facility] EMERGENCY DEPARTMENT Provider Note   CSN: [id number] Arrival date & time: 06/05/19  1009     History   Chief Complaint Chief Complaint  Patient presents with  . Hypoglycemia    HPI [name] is a 78 y.o. female.     Pt presents to the ED today with low blood sugar.  The pt takes Lantus every morning.  She checked her blood sugar prior to taking it and it was 84.  She was found shaking by her husband in the living room.  He called EMS and her BS was 42.  EMS was able to get her to eat some PB crackers and drink some sweet prior to coming here.  The pt did not take any other of her meds.  She has not had any recent changes to her meds.  The pt has had periodic episodes where she gets shaky and sweaty and her clothes get wet.  She feels like it is was low blood sugar, but has not checked her blood sugar during these episodes.  She said this one came on suddenly and she does not remember feeling bad.  This has never happened in the past.  She denies any f/c.  No n/v/d.  She has otherwise been feeling well.  She feels back to nl now.     Past Medical History:  Diagnosis Date  . Arthritis   . B12 deficiency anemia   . Diabetes mellitus ORAL AND INSULIN MEDS  . Fibrosis of knee joint RIGHT  . Heart palpitations SINCE 2008 TAKES BETA BLOCKER  . 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 Hyperlipidemia   . Hypertension   . Neuropathy, diabetic ([place]) RIGHT FOOT MILD NUMBNESS  . PONV (postoperative nausea and vomiting)     Patient Active Problem List   Diagnosis Date Noted  . OA (osteoarthritis) of knee 10/23/2015  . Pre-operative cardiovascular examination 08/24/2015  . 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 ABDOMINAL HYSTERECTOMY    . BALLOON DILATION N/A 06/12/2015    Procedure: BALLOON DILATION;  Surgeon: [name], MD;  Location: [facility];  Service: Endoscopy;  Laterality: N/A;  . BREAST LUMPECTOMY  07-27-2007   LEFT   . CARDIAC CATHETERIZATION  2001   MIMINAL PLAQUE RCA, CIRCUMFLEX, LAD;   NORMAL LVF  . CERVICAL FUSION  1996   C3 - 5  . ESOPHAGOGASTRODUODENOSCOPY (EGD) WITH PROPOFOL N/A 06/12/2015   Procedure: ESOPHAGOGASTRODUODENOSCOPY (EGD) WITH PROPOFOL;  Surgeon: [name], MD;  Location: [facility];  Service: Endoscopy;  Laterality: N/A;  . EXCHANGE LEFT BREAST IMPLANT/ CAPSULOTOMY/ RIGHT MASTOPEXY  11-28-2008  . JOINT REPLACEMENT    . KNEE ARTHROSCOPY  1999   RIGHT  . KNEE ARTHROTOMY  04-03-2011   RIGHT KNEE/ SCAR EXCISION/ POLYTHYLENE EXCHANGE  . KNEE CLOSED REDUCTION  07/31/2011   Procedure: CLOSED MANIPULATION KNEE;  Surgeon: [name], MD;  Location: [facility];  Service: Orthopedics;  Laterality: Right;  . LEFT BREAST BX ;  X2  1976  . MANIPULATION KNEE JOINT  04-02-2006   CLOSED --  RIGHT   . TOTAL ABDOMINAL HYSTERECTOMY W/ BILATERAL SALPINGOOPHORECTOMY  1989   AND APPENDECTOMY  . TOTAL KNEE ARTHROPLASTY  02-03-2006   RIGHT  . TOTAL KNEE ARTHROPLASTY Left 10/23/2015  Procedure: TOTAL LEFT KNEE ARTHROPLASTY;  Surgeon: [name], MD;  Location: [facility];  Service: Orthopedics;  Laterality: Left;  . TOTAL LEFT MASTECTOMY  09-29-2007  . TUBAL LIGATION  1976  . URETHRAL DILATION  1976     OB History   No obstetric history on file.      Home Medications    Prior to Admission medications   Medication Sig Start Date End Date Taking? Authorizing Provider  cetirizine (ZYRTEC) 10 MG tablet Take 10 mg by mouth daily.    [provider]  estradiol (ESTRACE) 0.1 MG/GM vaginal cream Place 1 Applicatorful vaginally daily as needed (Irritation or dryness).    [provider]  estradiol (ESTRACE) 0.5 MG tablet Take 0.5 mg by mouth daily.     [provider]  furosemide  (LASIX) 40 MG tablet Take 20 mg by mouth daily.    [provider]  insulin glargine (LANTUS) 100 UNIT/ML injection Inject 40 Units into the skin every morning.     [provider]  JANUMET 50-500 MG tablet Take 1 tablet by mouth 2 (two) times daily. 04/18/15   [provider]  lisinopril (PRINIVIL,ZESTRIL) 5 MG tablet Take 2.5 mg by mouth daily.     [provider]  methocarbamol (ROBAXIN) 500 MG tablet Take 1 tablet (500 mg total) by mouth every 6 (six) hours as needed for muscle spasms. 10/24/15   Constable, Amber, PA-C  metoprolol succinate (TOPROL-XL) 50 MG 24 hr tablet Take 50 mg by mouth daily. Take with or immediately following a meal.    [provider]  rivaroxaban (XARELTO) 10 MG TABS tablet Take 1 tablet (10 mg total) by mouth daily with breakfast. 10/24/15   Cecilio Asper, Amber, PA-C  rosuvastatin (CRESTOR) 40 MG tablet Take 10 mg by mouth daily.    [provider]  tobramycin-dexamethasone [facility]) ophthalmic solution Place 1 drop into the left eye daily as needed. (redness) 06/15/15   [provider]  traMADol (ULTRAM) 50 MG tablet Take 1-2 tablets (50-100 mg total) by mouth every 6 (six) hours as needed for moderate pain. 10/24/15   Ardeen Jourdain, PA-C    Family History Family History  Problem Relation Age of Onset  . Heart disease Mother   . Stroke Father   . Cancer Sister        stomach  . Cancer Brother        colon  . Breast cancer Neg Hx     Social History Social History   Tobacco Use  . Smoking status: Never Smoker  . Smokeless tobacco: Never Used  Substance Use Topics  . Alcohol use: No  . Drug use: No     Allergies   Contrast media [iodinated diagnostic agents], Epinephrine, and Sulfa antibiotics   Review of Systems Review of Systems  All other systems reviewed and are negative.    Physical Exam Updated Vital Signs BP 122/66   Pulse 65   Resp 19   Ht 5\' 7"  (1.702 m)   Wt 77.6 kg    SpO2 99%   BMI 26.78 kg/m   Physical Exam Vitals signs and nursing note reviewed.  Constitutional:      Appearance: Normal appearance.  HENT:     Head: Normocephalic and atraumatic.     Right Ear: External ear normal.     Left Ear: External ear normal.     Nose: Nose normal.     Mouth/Throat:     Mouth: Mucous membranes are moist.  Pharynx: Oropharynx is clear.  Eyes:     Extraocular Movements: Extraocular movements intact.     Conjunctiva/sclera: Conjunctivae normal.     Pupils: Pupils are equal, round, and reactive to light.  Neck:     Musculoskeletal: Normal range of motion and neck supple.  Cardiovascular:     Rate and Rhythm: Normal rate and regular rhythm.     Pulses: Normal pulses.     Heart sounds: Normal heart sounds.  Pulmonary:     Effort: Pulmonary effort is normal.     Breath sounds: Normal breath sounds.  Abdominal:     General: Abdomen is flat. Bowel sounds are normal.     Palpations: Abdomen is soft.  Musculoskeletal: Normal range of motion.     Comments: Bruise to right hand, but she is able to move it.  She does not want an xray.  Skin:    General: Skin is warm.     Capillary Refill: Capillary refill takes less than 2 seconds.  Neurological:     General: No focal deficit present.     Mental Status: She is alert and oriented to person, place, and time.  Psychiatric:        Mood and Affect: Mood normal.        Behavior: Behavior normal.        Thought Content: Thought content normal.        Judgment: Judgment normal.      ED Treatments / Results  Labs (all labs ordered are listed, but only abnormal results are displayed) Labs Reviewed  COMPREHENSIVE METABOLIC PANEL - Abnormal; Notable for the following components:      Result Value   Glucose, Bld 111 (*)    All other components within normal limits  CBC  URINALYSIS, ROUTINE W REFLEX MICROSCOPIC  CBG MONITORING, ED  CBG MONITORING, ED  CBG MONITORING, ED    EKG EKG  Interpretation  Date/Time:  Saturday June 05 2019 10:37:58 EST Ventricular Rate:  83 PR Interval:    QRS Duration: 103 QT Interval:  388 QTC Calculation: 456 R Axis:   27 Text Interpretation: Sinus rhythm Abnormal R-wave progression, early transition Baseline wander in lead(s) V2 No significant change since last tracing Confirmed by Isla Pence [phone]) on 06/05/2019 11:13:22 AM   Radiology Dg Chest 2 View  Result Date: 06/05/2019 CLINICAL DATA:  Hypoglycemia EXAM: CHEST - 2 VIEW COMPARISON:  None. FINDINGS: Lungs are clear. Heart size and pulmonary vascularity are normal. No adenopathy. No pneumothorax. No bone lesions. IMPRESSION: No edema or consolidation. Electronically Signed   By: Lowella Grip III M.D.   On: 06/05/2019 12:01    Procedures Procedures (including critical care time)  Medications Ordered in ED Medications  sodium chloride flush (NS) 0.9 % injection 3 mL (3 mLs Intravenous Given 06/05/19 1044)  sodium chloride flush (NS) 0.9 % injection 3 mL (has no administration in time range)  0.9 %  sodium chloride infusion (has no administration in time range)     Initial Impression / Assessment and Plan / ED Course  I have reviewed the triage vital signs and the nursing notes.  Pertinent labs & imaging results that were available during my care of the patient were reviewed by me and considered in my medical decision making (see chart for details).      Pt has been observed for several hours.  BS has remained stable.  Work up otherwise is reassuring.  Pt encouraged to eat small, frequent meals high in  protein.  She is to f/u with her pcp and endocrinologist.  Return if worse.  Final Clinical Impressions(s) / ED Diagnoses   Final diagnoses:  Hypoglycemia    ED Discharge Orders    None       Isla Pence, MD 06/05/19 1411

## 2019-06-05 NOTE — ED Notes (Signed)
Patient transported to X-ray 

## 2019-06-05 NOTE — ED Notes (Signed)
CBG 81 upon arrival to ED.

## 2019-06-05 NOTE — ED Triage Notes (Signed)
Pt arrives via [facility] from home, husband found her in living room said that she was shaking, Pt was alert to fire with a CBG of 42. EMS was able to get patient to eat some peanut butter crackers and drink a glass of sweet tea. Last CBG 75. HR in 90s and BP 156/90 did not have home medications other than insulin prior to CBG drop. 20 IV R AC by EMS.

## 2019-06-05 NOTE — ED Notes (Signed)
ED Provider at bedside. 

## 2019-06-05 NOTE — ED Notes (Signed)
Pt reports that she checked her blood sugar prior to taking her insulin this morning and it was 84.

## 2020-01-19 ENCOUNTER — Other Ambulatory Visit: Payer: Self-pay

## 2020-01-19 ENCOUNTER — Other Ambulatory Visit: Payer: Self-pay | Admitting: Internal Medicine

## 2020-01-19 ENCOUNTER — Ambulatory Visit
Admission: RE | Admit: 2020-01-19 | Discharge: 2020-01-19 | Disposition: A | Discharge status: Home / Self Care | Payer: Medicare Other | Source: Ambulatory Visit | Attending: Internal Medicine | Admitting: Internal Medicine

## 2020-01-19 DIAGNOSIS — Z1231 Encounter for screening mammogram for malignant neoplasm of breast: Secondary | ICD-10-CM

## 2020-07-18 ENCOUNTER — Encounter (HOSPITAL_BASED_OUTPATIENT_CLINIC_OR_DEPARTMENT_OTHER): Payer: Self-pay | Admitting: Emergency Medicine

## 2020-07-18 ENCOUNTER — Other Ambulatory Visit: Payer: Self-pay

## 2020-07-18 DIAGNOSIS — Z794 Long term (current) use of insulin: Secondary | ICD-10-CM | POA: Insufficient documentation

## 2020-07-18 DIAGNOSIS — W01198A Fall on same level from slipping, tripping and stumbling with subsequent striking against other object, initial encounter: Secondary | ICD-10-CM | POA: Insufficient documentation

## 2020-07-18 DIAGNOSIS — Z79899 Other long term (current) drug therapy: Secondary | ICD-10-CM | POA: Diagnosis not present

## 2020-07-18 DIAGNOSIS — S0181XA Laceration without foreign body of other part of head, initial encounter: Secondary | ICD-10-CM | POA: Insufficient documentation

## 2020-07-18 DIAGNOSIS — I10 Essential (primary) hypertension: Secondary | ICD-10-CM | POA: Diagnosis not present

## 2020-07-18 DIAGNOSIS — Z955 Presence of coronary angioplasty implant and graft: Secondary | ICD-10-CM | POA: Insufficient documentation

## 2020-07-18 DIAGNOSIS — S0993XA Unspecified injury of face, initial encounter: Secondary | ICD-10-CM | POA: Diagnosis present

## 2020-07-18 DIAGNOSIS — Z23 Encounter for immunization: Secondary | ICD-10-CM | POA: Diagnosis not present

## 2020-07-18 DIAGNOSIS — E1140 Type 2 diabetes mellitus with diabetic neuropathy, unspecified: Secondary | ICD-10-CM | POA: Insufficient documentation

## 2020-07-18 DIAGNOSIS — E119 Type 2 diabetes mellitus without complications: Secondary | ICD-10-CM | POA: Insufficient documentation

## 2020-07-18 DIAGNOSIS — Z96653 Presence of artificial knee joint, bilateral: Secondary | ICD-10-CM | POA: Diagnosis not present

## 2020-07-18 NOTE — ED Triage Notes (Signed)
Pt states she slipped on a throw rug and fell  Pt states she hit the right side of her head  Pt denies LOC  Pt states she also hurt her pinky finger on her left hand and her right wrist   Pt has a laceration noted to the right side of her forehead  Bleeding controlled at this time

## 2020-07-19 ENCOUNTER — Emergency Department (HOSPITAL_BASED_OUTPATIENT_CLINIC_OR_DEPARTMENT_OTHER): Payer: Medicare Other

## 2020-07-19 ENCOUNTER — Encounter (HOSPITAL_BASED_OUTPATIENT_CLINIC_OR_DEPARTMENT_OTHER): Payer: Self-pay | Admitting: Emergency Medicine

## 2020-07-19 ENCOUNTER — Emergency Department (HOSPITAL_BASED_OUTPATIENT_CLINIC_OR_DEPARTMENT_OTHER)
Admission: EM | Admit: 2020-07-19 | Discharge: 2020-07-19 | Disposition: A | Discharge status: Home / Self Care | Payer: Medicare Other | Attending: Emergency Medicine | Admitting: Emergency Medicine

## 2020-07-19 DIAGNOSIS — S0181XA Laceration without foreign body of other part of head, initial encounter: Secondary | ICD-10-CM

## 2020-07-19 DIAGNOSIS — W19XXXA Unspecified fall, initial encounter: Secondary | ICD-10-CM

## 2020-07-19 MED ORDER — TETANUS-DIPHTH-ACELL PERTUSSIS 5-2.5-18.5 LF-MCG/0.5 IM SUSY
0.5000 mL | PREFILLED_SYRINGE | Freq: Once | INTRAMUSCULAR | Status: AC
  Administered 2020-07-19: 0.5 mL via INTRAMUSCULAR
  Filled 2020-07-19: qty 0.5

## 2020-07-19 MED ORDER — ACETAMINOPHEN 500 MG PO TABS
1000.0000 mg | ORAL_TABLET | Freq: Once | ORAL | Status: AC
  Administered 2020-07-19: 1000 mg via ORAL
  Filled 2020-07-19: qty 2

## 2020-07-19 NOTE — ED Notes (Signed)
Patient transported to CT 

## 2020-07-19 NOTE — ED Notes (Signed)
Wound care.

## 2020-07-19 NOTE — ED Provider Notes (Signed)
[facility] DEPARTMENT Provider Note   CSN: [id number] Arrival date & time: 07/18/20  2253     History Chief Complaint  Patient presents with  . Fall    [name] is a 80 y.o. female.  The history is provided by the patient.  Fall This is a new problem. The current episode started 6 to 12 hours ago. The problem occurs constantly. Pertinent negatives include no chest pain, no abdominal pain, no headaches and no shortness of breath. Nothing aggravates the symptoms. Nothing relieves the symptoms. She has tried nothing for the symptoms. The treatment provided no relief.  fell and hit head on carpet and now has 2.5 cm forehead laceration      Past Medical History:  Diagnosis Date  . Arthritis   . B12 deficiency anemia   . Diabetes mellitus ORAL AND INSULIN MEDS  . Fibrosis of knee joint RIGHT  . Heart palpitations SINCE 2008 TAKES BETA BLOCKER  . 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 Hyperlipidemia   . Hypertension   . Neuropathy, diabetic ([place]) RIGHT FOOT MILD NUMBNESS  . PONV (postoperative nausea and vomiting)     Patient Active Problem List   Diagnosis Date Noted  . OA (osteoarthritis) of knee 10/23/2015  . Pre-operative cardiovascular examination 08/24/2015  . 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 ABDOMINAL HYSTERECTOMY    . BALLOON DILATION N/A 06/12/2015   Procedure: BALLOON DILATION;  Surgeon: [name], MD;  Location: [facility];  Service: Endoscopy;  Laterality: N/A;  . BREAST LUMPECTOMY  07-27-2007   LEFT   . CARDIAC CATHETERIZATION  2001   MIMINAL PLAQUE RCA, CIRCUMFLEX, LAD;   NORMAL LVF  . CERVICAL FUSION  1996   C3 - 5  . ESOPHAGOGASTRODUODENOSCOPY (EGD) WITH PROPOFOL N/A 06/12/2015   Procedure: ESOPHAGOGASTRODUODENOSCOPY (EGD) WITH  PROPOFOL;  Surgeon: [name], MD;  Location: [facility];  Service: Endoscopy;  Laterality: N/A;  . EXCHANGE LEFT BREAST IMPLANT/ CAPSULOTOMY/ RIGHT MASTOPEXY  11-28-2008  . JOINT REPLACEMENT    . KNEE ARTHROSCOPY  1999   RIGHT  . KNEE ARTHROTOMY  04-03-2011   RIGHT KNEE/ SCAR EXCISION/ POLYTHYLENE EXCHANGE  . KNEE CLOSED REDUCTION  07/31/2011   Procedure: CLOSED MANIPULATION KNEE;  Surgeon: [name], MD;  Location: [facility];  Service: Orthopedics;  Laterality: Right;  . LEFT BREAST BX ;  X2  1976  . MANIPULATION KNEE JOINT  04-02-2006   CLOSED --  RIGHT   . TOTAL ABDOMINAL HYSTERECTOMY W/ BILATERAL SALPINGOOPHORECTOMY  1989   AND APPENDECTOMY  . TOTAL KNEE ARTHROPLASTY  02-03-2006   RIGHT  . TOTAL KNEE ARTHROPLASTY Left 10/23/2015   Procedure: TOTAL LEFT KNEE ARTHROPLASTY;  Surgeon: [name], MD;  Location: [facility];  Service: Orthopedics;  Laterality: Left;  . TOTAL LEFT MASTECTOMY  09-29-2007  . TUBAL LIGATION  1976  . URETHRAL DILATION  1976     OB History   No obstetric history on file.     Family History  Problem Relation Age of Onset  . Heart disease Mother   . Stroke Father   . Cancer Sister        stomach  . Cancer Brother        colon  . Breast cancer Neg Hx  Social History   Tobacco Use  . Smoking status: Never Smoker  . Smokeless tobacco: Never Used  Vaping Use  . Vaping Use: Never used  Substance Use Topics  . Alcohol use: No  . Drug use: No    Home Medications Prior to Admission medications   Medication Sig Start Date End Date Taking? Authorizing Provider  cetirizine (ZYRTEC) 10 MG tablet Take 10 mg by mouth daily.    [provider]  estradiol (ESTRACE) 0.1 MG/GM vaginal cream Place 1 Applicatorful vaginally daily as needed (Irritation or dryness).    [provider]  estradiol (ESTRACE) 0.5 MG tablet Take 0.5 mg by mouth daily.     [provider]  furosemide (LASIX) 40 MG tablet  Take 20 mg by mouth daily.    [provider]  insulin glargine (LANTUS) 100 UNIT/ML injection Inject 40 Units into the skin every morning.     [provider]  JANUMET 50-500 MG tablet Take 1 tablet by mouth 2 (two) times daily. 04/18/15   [provider]  lisinopril (PRINIVIL,ZESTRIL) 5 MG tablet Take 2.5 mg by mouth daily.     [provider]  methocarbamol (ROBAXIN) 500 MG tablet Take 1 tablet (500 mg total) by mouth every 6 (six) hours as needed for muscle spasms. 10/24/15   Constable, Amber, PA-C  metoprolol succinate (TOPROL-XL) 50 MG 24 hr tablet Take 50 mg by mouth daily. Take with or immediately following a meal.    [provider]  rivaroxaban (XARELTO) 10 MG TABS tablet Take 1 tablet (10 mg total) by mouth daily with breakfast. 10/24/15   Cecilio Asper, Amber, PA-C  rosuvastatin (CRESTOR) 40 MG tablet Take 10 mg by mouth daily.    [provider]  tobramycin-dexamethasone [facility]) ophthalmic solution Place 1 drop into the left eye daily as needed. (redness) 06/15/15   [provider]  traMADol (ULTRAM) 50 MG tablet Take 1-2 tablets (50-100 mg total) by mouth every 6 (six) hours as needed for moderate pain. 10/24/15   Constable, Museum/gallery conservator, PA-C    Allergies    Contrast media [iodinated diagnostic agents], Epinephrine, and Sulfa antibiotics  Review of Systems   Review of Systems  Constitutional: Negative for fever.  HENT: Negative for congestion.   Eyes: Negative for visual disturbance.  Respiratory: Negative for shortness of breath.   Cardiovascular: Negative for chest pain.  Gastrointestinal: Negative for abdominal pain.  Genitourinary: Negative for difficulty urinating.  Musculoskeletal: Negative for arthralgias, back pain, gait problem and neck pain.  Skin: Positive for wound.  Neurological: Negative for weakness and headaches.  Psychiatric/Behavioral: Negative for agitation.  All other systems reviewed and are  negative.   Physical Exam Updated Vital Signs BP (!) 142/71 (BP Location: Right Arm)   Pulse 86   Temp 98 F (36.7 C) (Oral)   Resp 16   Ht 5\' 7"  (1.702 m)   Wt 77.6 kg   SpO2 97%   BMI 26.78 kg/m   Physical Exam Vitals and nursing note reviewed.  Constitutional:      General: She is not in acute distress.    Appearance: Normal appearance.  HENT:     Head: Normocephalic.      Nose: Nose normal.     Mouth/Throat:     Mouth: Mucous membranes are moist.     Pharynx: Oropharynx is clear.  Eyes:     Conjunctiva/sclera: Conjunctivae normal.     Pupils: Pupils are equal, round, and reactive to light.  Cardiovascular:  Rate and Rhythm: Normal rate and regular rhythm.     Pulses: Normal pulses.     Heart sounds: Normal heart sounds.  Pulmonary:     Effort: Pulmonary effort is normal.     Breath sounds: Normal breath sounds.  Abdominal:     General: Abdomen is flat. Bowel sounds are normal.     Palpations: Abdomen is soft.     Tenderness: There is no abdominal tenderness. There is no guarding.  Musculoskeletal:        General: No swelling. Normal range of motion.     Right forearm: Normal.     Left forearm: Normal.     Right wrist: Normal. No bony tenderness or snuff box tenderness.     Left wrist: Normal. No bony tenderness or snuff box tenderness.     Right hand: Normal. No bony tenderness.     Left hand: Normal. No bony tenderness. Normal strength.     Cervical back: Normal range of motion and neck supple.  Skin:    General: Skin is warm and dry.     Capillary Refill: Capillary refill takes less than 2 seconds.  Neurological:     General: No focal deficit present.     Mental Status: She is alert and oriented to person, place, and time.  Psychiatric:        Mood and Affect: Mood normal.        Behavior: Behavior normal.     ED Results / Procedures / Treatments   Labs (all labs ordered are listed, but only abnormal results are displayed) Labs Reviewed - No  data to display  EKG None  Radiology CT Head Wo Contrast  Result Date: 07/19/2020 CLINICAL DATA:  Fall EXAM: CT HEAD WITHOUT CONTRAST CT CERVICAL SPINE WITHOUT CONTRAST TECHNIQUE: Multidetector CT imaging of the head and cervical spine was performed following the standard protocol without intravenous contrast. Multiplanar CT image reconstructions of the cervical spine were also generated. COMPARISON:  None. FINDINGS: CT HEAD FINDINGS Brain: There is no mass, hemorrhage or extra-axial collection. The size and configuration of the ventricles and extra-axial CSF spaces are normal. There is hypoattenuation of the periventricular white matter, most commonly indicating chronic ischemic microangiopathy. Vascular: No abnormal hyperdensity of the major intracranial arteries or dural venous sinuses. No intracranial atherosclerosis. Skull: The visualized skull base, calvarium and extracranial soft tissues are normal. Sinuses/Orbits: No fluid levels or advanced mucosal thickening of the visualized paranasal sinuses. No mastoid or middle ear effusion. The orbits are normal. CT CERVICAL SPINE FINDINGS Alignment: No static subluxation. Facets are aligned. Occipital condyles are normally positioned. Skull base and vertebrae: C5-7 fusion.  No fracture. Soft tissues and spinal canal: No prevertebral fluid or swelling. No visible canal hematoma. Disc levels: No advanced spinal canal or neural foraminal stenosis. Multilevel facet hypertrophy. Upper chest: No pneumothorax, pulmonary nodule or pleural effusion. Other: Normal visualized paraspinal cervical soft tissues. IMPRESSION: 1. Chronic ischemic microangiopathy without acute intracranial abnormality. 2. No acute fracture or static subluxation of the cervical spine. Electronically Signed   By: Ulyses Jarred M.D.   On: 07/19/2020 02:57   CT Cervical Spine Wo Contrast  Result Date: 07/19/2020 CLINICAL DATA:  Fall EXAM: CT HEAD WITHOUT CONTRAST CT CERVICAL SPINE WITHOUT  CONTRAST TECHNIQUE: Multidetector CT imaging of the head and cervical spine was performed following the standard protocol without intravenous contrast. Multiplanar CT image reconstructions of the cervical spine were also generated. COMPARISON:  None. FINDINGS: CT HEAD FINDINGS Brain: There is no  mass, hemorrhage or extra-axial collection. The size and configuration of the ventricles and extra-axial CSF spaces are normal. There is hypoattenuation of the periventricular white matter, most commonly indicating chronic ischemic microangiopathy. Vascular: No abnormal hyperdensity of the major intracranial arteries or dural venous sinuses. No intracranial atherosclerosis. Skull: The visualized skull base, calvarium and extracranial soft tissues are normal. Sinuses/Orbits: No fluid levels or advanced mucosal thickening of the visualized paranasal sinuses. No mastoid or middle ear effusion. The orbits are normal. CT CERVICAL SPINE FINDINGS Alignment: No static subluxation. Facets are aligned. Occipital condyles are normally positioned. Skull base and vertebrae: C5-7 fusion.  No fracture. Soft tissues and spinal canal: No prevertebral fluid or swelling. No visible canal hematoma. Disc levels: No advanced spinal canal or neural foraminal stenosis. Multilevel facet hypertrophy. Upper chest: No pneumothorax, pulmonary nodule or pleural effusion. Other: Normal visualized paraspinal cervical soft tissues. IMPRESSION: 1. Chronic ischemic microangiopathy without acute intracranial abnormality. 2. No acute fracture or static subluxation of the cervical spine. Electronically Signed   By: Ulyses Jarred M.D.   On: 07/19/2020 02:57    Procedures .Marland KitchenLaceration Repair  Date/Time: 07/19/2020 3:32 AM Performed by: Veatrice Kells, MD Authorized by: Veatrice Kells, MD   Consent:    Consent obtained:  Verbal   Consent given by:  Patient   Risks, benefits, and alternatives were discussed: yes     Risks discussed:  Infection, need for  additional repair, nerve damage, pain, poor cosmetic result, poor wound healing, retained foreign body, tendon damage and vascular damage   Alternatives discussed:  No treatment and delayed treatment Universal protocol:    Patient identity confirmed:  Arm band Anesthesia:    Anesthesia method:  None Laceration details:    Location:  Face   Face location:  Forehead   Length (cm):  2.5   Depth (mm):  0.5 Pre-procedure details:    Preparation:  Patient was prepped and draped in usual sterile fashion Exploration:    Hemostasis achieved with:  Direct pressure   Wound exploration: wound explored through full range of motion     Wound extent: no areolar tissue violation noted     Contaminated: no   Treatment:    Area cleansed with:  Chlorhexidine   Amount of cleaning:  Extensive   Irrigation solution:  Sterile saline   Irrigation method:  Syringe   Debridement:  None Skin repair:    Repair method:  Tissue adhesive Approximation:    Approximation:  Close Repair type:    Repair type:  Simple Post-procedure details:    Dressing:  Sterile dressing   Procedure completion:  Tolerated well, no immediate complications   (including critical care time)  Medications Ordered in ED Medications  Tdap (BOOSTRIX) injection 0.5 mL (has no administration in time range)  acetaminophen (TYLENOL) tablet 1,000 mg (has no administration in time range)    ED Course  I have reviewed the triage vital signs and the nursing notes.  Pertinent labs & imaging results that were available during my care of the patient were reviewed by me and considered in my medical decision making (see chart for details).    MDM Rules/Calculators/A&P                         Refused hand and wrist xrays stating that they do not hurt.  wound care instructions given.    Saquoia Sianez was evaluated in Emergency Department on 07/19/2020 for the symptoms described in the history  of present illness. She was evaluated in the  context of the global COVID-19 pandemic, which necessitated consideration that the patient might be at risk for infection with the SARS-CoV-2 virus that causes COVID-19. Institutional protocols and algorithms that pertain to the evaluation of patients at risk for COVID-19 are in a state of rapid change based on information released by regulatory bodies including the CDC and federal and state organizations. These policies and algorithms were followed during the patient's care in the ED. Final Clinical Impression(s) / ED Diagnoses Final diagnoses:  None   Return for intractable cough, coughing up blood,fevers >100.4 unrelieved by medication, shortness of breath, intractable vomiting, chest pain, shortness of breath, weakness,numbness, changes in speech, facial asymmetry,abdominal pain, passing out,Inability to tolerate liquids or food, cough, altered mental status or any concerns. No signs of systemic illness or infection. The patient is nontoxic-appearing on exam and vital signs are within normal limits.   I have reviewed the triage vital signs and the nursing notes. Pertinent labs &imaging results that were available during my care of the patient were reviewed by me and considered in my medical decision making (see chart for details).After history, exam, and medical workup I feel the patient has beenappropriately medically screened and is safe for discharge home. Pertinent diagnoses were discussed with the patient. Patient was given return precautions.      Ikaika Showers, MD 07/19/20 [phone]

## 2020-09-04 ENCOUNTER — Other Ambulatory Visit: Payer: Self-pay | Admitting: Internal Medicine

## 2020-09-04 ENCOUNTER — Ambulatory Visit
Admission: RE | Admit: 2020-09-04 | Discharge: 2020-09-04 | Disposition: A | Discharge status: Home / Self Care | Payer: Medicare Other | Source: Ambulatory Visit | Attending: Internal Medicine | Admitting: Internal Medicine

## 2020-09-04 DIAGNOSIS — R0781 Pleurodynia: Secondary | ICD-10-CM

## 2020-09-08 ENCOUNTER — Other Ambulatory Visit: Payer: Self-pay | Admitting: Internal Medicine

## 2020-09-08 ENCOUNTER — Ambulatory Visit
Admission: RE | Admit: 2020-09-08 | Discharge: 2020-09-08 | Disposition: A | Discharge status: Home / Self Care | Payer: Medicare Other | Source: Ambulatory Visit | Attending: Internal Medicine | Admitting: Internal Medicine

## 2020-09-08 DIAGNOSIS — R918 Other nonspecific abnormal finding of lung field: Secondary | ICD-10-CM

## 2020-09-12 ENCOUNTER — Telehealth: Payer: Self-pay

## 2020-09-12 NOTE — Telephone Encounter (Signed)
Nia with Dr. [name] office called wanting to schedule this pt a new pt appt for a lung mass. I advised Nia they will need to send a newpt referral to [place] via fax at [phone]. She expressed understanding of this information.

## 2020-09-13 ENCOUNTER — Telehealth: Payer: Self-pay | Admitting: Internal Medicine

## 2020-09-13 ENCOUNTER — Encounter: Payer: Self-pay | Admitting: *Deleted

## 2020-09-13 DIAGNOSIS — R918 Other nonspecific abnormal finding of lung field: Secondary | ICD-10-CM

## 2020-09-13 NOTE — Telephone Encounter (Signed)
Received a new pt referral from Dr. 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 [name] has been cld and scheduled to see Dr. Julien Nordmann on 3/14 at 2;15pm w/labs at 1:45pm. Pt aware to arrive 15 minutes early.

## 2020-09-13 NOTE — Progress Notes (Signed)
I received referral on [name]. I updated Dr. Julien Nordmann and he would like to see her on 09/18/20 with labs. I notified new patient scheduler to call and schedule her.

## 2020-09-18 ENCOUNTER — Inpatient Hospital Stay: Payer: Medicare Other | Attending: Internal Medicine | Admitting: Internal Medicine

## 2020-09-18 ENCOUNTER — Encounter: Payer: Self-pay | Admitting: Internal Medicine

## 2020-09-18 ENCOUNTER — Telehealth: Payer: Self-pay | Admitting: Internal Medicine

## 2020-09-18 ENCOUNTER — Inpatient Hospital Stay: Payer: Medicare Other

## 2020-09-18 ENCOUNTER — Other Ambulatory Visit: Payer: Self-pay

## 2020-09-18 VITALS — BP 130/66 | HR 101 | Temp 98.1°F | Resp 20 | Ht 67.0 in | Wt 163.4 lb

## 2020-09-18 DIAGNOSIS — I700 Atherosclerosis of aorta: Secondary | ICD-10-CM | POA: Insufficient documentation

## 2020-09-18 DIAGNOSIS — E041 Nontoxic single thyroid nodule: Secondary | ICD-10-CM | POA: Insufficient documentation

## 2020-09-18 DIAGNOSIS — Z90722 Acquired absence of ovaries, bilateral: Secondary | ICD-10-CM | POA: Insufficient documentation

## 2020-09-18 DIAGNOSIS — Z79899 Other long term (current) drug therapy: Secondary | ICD-10-CM | POA: Diagnosis not present

## 2020-09-18 DIAGNOSIS — M859 Disorder of bone density and structure, unspecified: Secondary | ICD-10-CM

## 2020-09-18 DIAGNOSIS — N281 Cyst of kidney, acquired: Secondary | ICD-10-CM | POA: Diagnosis not present

## 2020-09-18 DIAGNOSIS — Z882 Allergy status to sulfonamides status: Secondary | ICD-10-CM | POA: Insufficient documentation

## 2020-09-18 DIAGNOSIS — Z7901 Long term (current) use of anticoagulants: Secondary | ICD-10-CM | POA: Insufficient documentation

## 2020-09-18 DIAGNOSIS — R590 Localized enlarged lymph nodes: Secondary | ICD-10-CM | POA: Diagnosis not present

## 2020-09-18 DIAGNOSIS — Z9012 Acquired absence of left breast and nipple: Secondary | ICD-10-CM | POA: Insufficient documentation

## 2020-09-18 DIAGNOSIS — Z853 Personal history of malignant neoplasm of breast: Secondary | ICD-10-CM | POA: Insufficient documentation

## 2020-09-18 DIAGNOSIS — R5383 Other fatigue: Secondary | ICD-10-CM | POA: Insufficient documentation

## 2020-09-18 DIAGNOSIS — Z800 Family history of malignant neoplasm of digestive organs: Secondary | ICD-10-CM

## 2020-09-18 DIAGNOSIS — Z9882 Breast implant status: Secondary | ICD-10-CM | POA: Insufficient documentation

## 2020-09-18 DIAGNOSIS — R918 Other nonspecific abnormal finding of lung field: Secondary | ICD-10-CM

## 2020-09-18 DIAGNOSIS — C44591 Other specified malignant neoplasm of skin of breast: Secondary | ICD-10-CM | POA: Diagnosis not present

## 2020-09-18 DIAGNOSIS — J90 Pleural effusion, not elsewhere classified: Secondary | ICD-10-CM | POA: Diagnosis not present

## 2020-09-18 DIAGNOSIS — K4020 Bilateral inguinal hernia, without obstruction or gangrene, not specified as recurrent: Secondary | ICD-10-CM | POA: Insufficient documentation

## 2020-09-18 DIAGNOSIS — E279 Disorder of adrenal gland, unspecified: Secondary | ICD-10-CM | POA: Diagnosis not present

## 2020-09-18 DIAGNOSIS — Z9049 Acquired absence of other specified parts of digestive tract: Secondary | ICD-10-CM | POA: Diagnosis not present

## 2020-09-18 DIAGNOSIS — Z8249 Family history of ischemic heart disease and other diseases of the circulatory system: Secondary | ICD-10-CM

## 2020-09-18 DIAGNOSIS — Z823 Family history of stroke: Secondary | ICD-10-CM

## 2020-09-18 LAB — CBC WITH DIFFERENTIAL (CANCER CENTER ONLY)
Abs Immature Granulocytes: 0.02 10*3/uL (ref 0.00–0.07)
Basophils Absolute: 0 10*3/uL (ref 0.0–0.1)
Basophils Relative: 0 %
Eosinophils Absolute: 0 10*3/uL (ref 0.0–0.5)
Eosinophils Relative: 0 %
HCT: 35.8 % — ABNORMAL LOW (ref 36.0–46.0)
Hemoglobin: 11.6 g/dL — ABNORMAL LOW (ref 12.0–15.0)
Immature Granulocytes: 0 %
Lymphocytes Relative: 17 %
Lymphs Abs: 1.3 10*3/uL (ref 0.7–4.0)
MCH: 28.2 pg (ref 26.0–34.0)
MCHC: 32.4 g/dL (ref 30.0–36.0)
MCV: 87.1 fL (ref 80.0–100.0)
Monocytes Absolute: 0.6 10*3/uL (ref 0.1–1.0)
Monocytes Relative: 8 %
Neutro Abs: 6 10*3/uL (ref 1.7–7.7)
Neutrophils Relative %: 75 %
Platelet Count: 332 10*3/uL (ref 150–400)
RBC: 4.11 MIL/uL (ref 3.87–5.11)
RDW: 14.2 % (ref 11.5–15.5)
WBC Count: 8 10*3/uL (ref 4.0–10.5)
nRBC: 0 % (ref 0.0–0.2)

## 2020-09-18 LAB — CMP (CANCER CENTER ONLY)
ALT: 9 U/L (ref 0–44)
AST: 11 U/L — ABNORMAL LOW (ref 15–41)
Albumin: 3.4 g/dL — ABNORMAL LOW (ref 3.5–5.0)
Alkaline Phosphatase: 77 U/L (ref 38–126)
Anion gap: 8 (ref 5–15)
BUN: 14 mg/dL (ref 8–23)
CO2: 28 mmol/L (ref 22–32)
Calcium: 9.6 mg/dL (ref 8.9–10.3)
Chloride: 104 mmol/L (ref 98–111)
Creatinine: 0.97 mg/dL (ref 0.44–1.00)
GFR, Estimated: 59 mL/min — ABNORMAL LOW (ref >60)
Glucose, Bld: 235 mg/dL — ABNORMAL HIGH (ref 70–99)
Potassium: 3.3 mmol/L — ABNORMAL LOW (ref 3.5–5.1)
Sodium: 140 mmol/L (ref 135–145)
Total Bilirubin: 0.5 mg/dL (ref 0.3–1.2)
Total Protein: 7.1 g/dL (ref 6.5–8.1)

## 2020-09-18 MED ORDER — OXYCODONE-ACETAMINOPHEN 5-325 MG PO TABS: 1.0000 | ORAL_TABLET | Freq: Three times a day (TID) | ORAL | 0 refills | Status: DC | PRN

## 2020-09-18 NOTE — Telephone Encounter (Signed)
Scheduled per los. Declined printout  

## 2020-09-18 NOTE — Progress Notes (Signed)
[place] Telephone:([phone]   [phone]  CONSULT NOTE  REFERRING PHYSICIAN: Dr. [name]  REASON FOR CONSULTATION:  80 years old white female with suspicious lung cancer.  HPI [name] is a 80 y.o. 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  The patient was followed by Dr. Jana Hakim for around 5 years with no evidence of recurrence.  She mentioned that she had a fall end of January 2022.  She had imaging studies of the head and neck at that time that were unremarkable.  Few weeks later she noticed right-sided pleuritic chest and back pain.  She was seen by her primary care physician Dr. Inda Merlin and chest x-ray was performed on September 04, 2020 and it showed approximately 5 cm medial right upper lobe mass.  This was followed by CT scan of the chest, abdomen and pelvis without contrast on September 05, 2020 and it showed mass identified at the posterior aspect of the right upper lobe measuring 4.5 x 5.4 x 5.5 cm consistent with a primary pulmonary neoplasm.  The mass abuts the pleura over a broad service and demonstrate posterior chest wall invasion and involvement of the posterior right fifth rib.  There was few calcification within the mass.  There was also an adjacent satellite nodule measuring 1.2 x 1.0 cm and tiny subpleural nodule in the right upper lobe.  There was single enlarged precarinal lymph node measuring 1.5 cm in short axis that is new.  The scan also showed left adrenal mass measuring 1.8 x 2.1 cm with an additional 1.5 x 1.2 cm nodule more inferiorly questionable for metastatic disease. Dr. Inda Merlin kindly referred the patient to me today for evaluation and recommendation regarding her condition. When seen today she continues to  have pain on the right side of the chest and back as well as the right breast.  She has been using ibuprofen with no improvement.  She also tried tramadol that was not effective.  The patient has no shortness of breath, cough or hemoptysis.  She has no nausea, vomiting, diarrhea or constipation.  She lost around 7 pounds in the last few weeks. Family history significant for mother with heart disease.  Father had a stroke.  Sister had stomach cancer and brother had colon cancer. The patient is married and has 2 children a son and daughter.  She was accompanied today by her daughter [name].  She used to work for the [employer]. She has no history of smoking, alcohol or drug abuse.  HPI  Past Medical History:  Diagnosis Date  . Arthritis   . B12 deficiency anemia   . Diabetes mellitus ORAL AND INSULIN MEDS  . Fibrosis of knee joint RIGHT  . Heart palpitations SINCE 2008 TAKES BETA BLOCKER  . 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 Hyperlipidemia   . Hypertension   . Neuropathy, diabetic ([place]) RIGHT FOOT MILD NUMBNESS  . PONV (postoperative nausea and vomiting)     Past Surgical History:  Procedure Laterality Date  . ABDOMINAL HYSTERECTOMY    . BALLOON DILATION N/A 06/12/2015   Procedure: BALLOON DILATION;  Surgeon: [name], MD;  Location: [facility];  Service: Endoscopy;  Laterality: N/A;  . BREAST LUMPECTOMY  07-27-2007   LEFT   . CARDIAC CATHETERIZATION  2001   MIMINAL PLAQUE RCA, CIRCUMFLEX, LAD;   NORMAL LVF  . CERVICAL FUSION  1996   C3 - 5  . ESOPHAGOGASTRODUODENOSCOPY (EGD) WITH PROPOFOL N/A 06/12/2015   Procedure: ESOPHAGOGASTRODUODENOSCOPY (EGD) WITH PROPOFOL;  Surgeon: [name], MD;  Location: [facility];  Service: Endoscopy;  Laterality: N/A;  . EXCHANGE LEFT BREAST IMPLANT/ CAPSULOTOMY/ RIGHT MASTOPEXY  11-28-2008  . JOINT  REPLACEMENT    . KNEE ARTHROSCOPY  1999   RIGHT  . KNEE ARTHROTOMY  04-03-2011   RIGHT KNEE/ SCAR EXCISION/ POLYTHYLENE EXCHANGE  . KNEE CLOSED REDUCTION  07/31/2011   Procedure: CLOSED MANIPULATION KNEE;  Surgeon: [name], MD;  Location: [facility];  Service: Orthopedics;  Laterality: Right;  . LEFT BREAST BX ;  X2  1976  . MANIPULATION KNEE JOINT  04-02-2006   CLOSED --  RIGHT   . TOTAL ABDOMINAL HYSTERECTOMY W/ BILATERAL SALPINGOOPHORECTOMY  1989   AND APPENDECTOMY  . TOTAL KNEE ARTHROPLASTY  02-03-2006   RIGHT  . TOTAL KNEE ARTHROPLASTY Left 10/23/2015   Procedure: TOTAL LEFT KNEE ARTHROPLASTY;  Surgeon: [name], MD;  Location: [facility];  Service: Orthopedics;  Laterality: Left;  . TOTAL LEFT MASTECTOMY  09-29-2007  . TUBAL LIGATION  1976  . URETHRAL DILATION  1976    Family History  Problem Relation Age of Onset  . Heart disease Mother   . Stroke Father   . Cancer Sister        stomach  . Cancer Brother        colon  . Breast cancer Neg Hx     Social History Social History   Tobacco Use  . Smoking status: Never Smoker  . Smokeless tobacco: Never Used  Vaping Use  . Vaping Use: Never used  Substance Use Topics  . Alcohol use: No  . Drug use: No    Allergies  Allergen Reactions  . Contrast Media [Iodinated Diagnostic Agents] Hives and Nausea And Vomiting    Patient reports that reaction was hives at time on contrast administration  . Epinephrine Other (See Comments)    DIZZY AND ALMOST SYNCOPE  . Sulfa Antibiotics Hives    Current Outpatient Medications  Medication Sig Dispense Refill  . cetirizine (ZYRTEC) 10 MG tablet Take 10 mg by mouth daily.    Marland Kitchen estradiol (ESTRACE) 0.1 MG/GM vaginal cream Place 1 Applicatorful vaginally daily as needed (Irritation or dryness).    Marland Kitchen estradiol (ESTRACE) 0.5 MG tablet Take 0.5 mg by mouth daily.     . furosemide (LASIX) 40 MG tablet Take 20 mg by mouth daily.    . insulin glargine (LANTUS)  100 UNIT/ML injection Inject 40 Units into the skin every morning.     Marland Kitchen JANUMET 50-500 MG tablet Take 1 tablet by mouth 2 (two) times daily.    Marland Kitchen lisinopril (PRINIVIL,ZESTRIL) 5 MG tablet Take 2.5 mg by mouth daily.     . methocarbamol (ROBAXIN) 500 MG tablet Take 1 tablet (500 mg total) by mouth every 6 (six) hours as needed for muscle spasms. 40 tablet 1  . metoprolol succinate (TOPROL-XL) 50 MG 24 hr tablet Take 50 mg by mouth daily. Take with or immediately following a meal.    . rivaroxaban (XARELTO) 10 MG TABS tablet Take 1 tablet (10 mg total) by mouth daily with breakfast. 20 tablet 0  . rosuvastatin (CRESTOR)  40 MG tablet Take 10 mg by mouth daily.    Marland Kitchen tobramycin-dexamethasone (TOBRADEX) ophthalmic solution Place 1 drop into the left eye daily as needed. (redness)    . traMADol (ULTRAM) 50 MG tablet Take 1-2 tablets (50-100 mg total) by mouth every 6 (six) hours as needed for moderate pain. 80 tablet 0   No current facility-administered medications for this visit.    Review of Systems  Constitutional: positive for weight loss Eyes: negative Ears, nose, mouth, throat, and face: negative Respiratory: positive for pleurisy/chest pain Cardiovascular: negative Gastrointestinal: negative Genitourinary:negative Integument/breast: negative Hematologic/lymphatic: negative Musculoskeletal:positive for back pain Neurological: negative Behavioral/Psych: negative Endocrine: negative Allergic/Immunologic: negative  Physical Exam  IRW:ERXVQ, healthy, no distress, well nourished and well developed SKIN: skin color, texture, turgor are normal, no rashes or significant lesions HEAD: Normocephalic, No masses, lesions, tenderness or abnormalities EYES: normal, PERRLA, Conjunctiva are pink and non-injected EARS: External ears normal, Canals clear OROPHARYNX:no exudate, no erythema and lips, buccal mucosa, and tongue normal  NECK: supple, no adenopathy, no JVD LYMPH:  no palpable  lymphadenopathy, no hepatosplenomegaly BREAST:not examined LUNGS: clear to auscultation , and palpation HEART: regular rate & rhythm, no murmurs and no gallops ABDOMEN:abdomen soft, non-tender, normal bowel sounds and no masses or organomegaly BACK: No CVA tenderness, Range of motion is normal EXTREMITIES:no joint deformities, effusion, or inflammation, no edema  NEURO: alert & oriented x 3 with fluent speech, no focal motor/sensory deficits  PERFORMANCE STATUS: ECOG 1  LABORATORY DATA: Lab Results  Component Value Date   WBC 9.1 06/05/2019   HGB 12.8 06/05/2019   HCT 39.9 06/05/2019   MCV 91.3 06/05/2019   PLT 233 06/05/2019      Chemistry      Component Value Date/Time   NA 139 06/05/2019 1042   NA 141 07/20/2012 0959   K 3.6 06/05/2019 1042   K 3.9 07/20/2012 0959   CL 106 06/05/2019 1042   CL 110 (H) 07/20/2012 0959   CO2 23 06/05/2019 1042   CO2 23 07/20/2012 0959   BUN 16 06/05/2019 1042   BUN 16.0 07/20/2012 0959   CREATININE 0.88 06/05/2019 1042   CREATININE 0.8 07/20/2012 0959      Component Value Date/Time   CALCIUM 8.9 06/05/2019 1042   CALCIUM 9.2 07/20/2012 0959   ALKPHOS 45 06/05/2019 1042   ALKPHOS 56 07/20/2012 0959   AST 19 06/05/2019 1042   AST 15 07/20/2012 0959   ALT 17 06/05/2019 1042   ALT 15 07/20/2012 0959   BILITOT 0.5 06/05/2019 1042   BILITOT 0.50 07/20/2012 0959       RADIOGRAPHIC STUDIES: CT ABDOMEN WO CONTRAST  Result Date: 09/08/2020 CLINICAL DATA:  Abnormal chest radiograph with RIGHT upper lobe mass, past history of LEFT breast cancer post mastectomy EXAM: CT CHEST AND ABDOMEN WITHOUT CONTRAST TECHNIQUE: Multidetector CT imaging of the chest and abdomen was performed following the standard protocol without intravenous contrast. Sagittal and coronal MPR images reconstructed from axial data set. Oral contrast was not administered. IV contrast not utilized due to history of contrast allergy. COMPARISON:  CT chest 07/14/2009, CT  abdomen and pelvis 08/20/2007 FINDINGS: CT CHEST FINDINGS WITHOUT CONTRAST Cardiovascular: Atherosclerotic calcifications aorta, proximal great vessels and coronary arteries. Aorta normal caliber. Heart unremarkable. No pericardial effusion. Mediastinum/Nodes: Esophagus unremarkable. Tiny RIGHT thyroid nodule; Not clinically significant; no follow-up imaging recommended (ref: J Am Coll Radiol. 2015 Feb;12(2): 143-50). Single enlarged precarinal lymph node 15 mm short axis image 23, new. No additional thoracic adenopathy. Post  LEFT mastectomy and placement of a LEFT breast prosthesis. Lungs/Pleura: Mass identified at posterior aspect of RIGHT upper lobe measuring 4.5 x 5.4 x 5.5 cm consistent with a pulmonary neoplasm. Mass abuts the pleura over a broad surface and demonstrates posterior chest wall invasion. Few calcifications within mass. Minimal surrounding infiltrate. Adjacent satellite nodule 12 x 10 mm image 45. Tiny subpleural nodule RIGHT upper lobe image 53. Small focus of infiltrate or scarring at minor fissure unchanged. Tiny lung nodule posterior RIGHT lower lobe base image 114 unchanged. Remaining lungs clear. Musculoskeletal: Bone island lower thoracic spine stable. Cortical thinning and bone erosion at the posterior RIGHT fifth rib by the RIGHT upper lobe mass. Mass also abuts the fourth rib without definite bone destruction. CT ABDOMEN FINDINGS WITHOUT CONTRAST Hepatobiliary: Gallbladder and liver unremarkable Pancreas: Atrophic without mass Spleen: Normal appearance Adrenals/Urinary Tract: Normal appearing RIGHT adrenal gland. LEFT adrenal mass 18 x 21 mm, with question an additional 15 x 12 mm nodule more inferiorly. Small peripelvic cysts at both kidneys. No additional renal mass or hydronephrosis. No urinary tract calcification. Stomach/Bowel: Stomach and visualized bowel loops unremarkable Vascular/Lymphatic: Atherosclerotic calcification aorta. Visualized aorta normal caliber. No upper abdominal  adenopathy. Other: No free air or free fluid in upper abdomen. No umbilical hernia. Musculoskeletal: Unremarkable IMPRESSION: 4.5 x 5.4 x 5.5 cm diameter RIGHT upper lobe mass consistent with a pulmonary neoplasm. Mass abuts the pleura over a broad surface and demonstrates posterior chest wall invasion and involvement of the posterior RIGHT fifth rib. Adjacent satellite nodule 12 x 10 mm with tiny nonspecific subpleural nodule RIGHT upper lobe. Single enlarged precarinal lymph node 15 mm short axis, new. LEFT adrenal mass 18 x 21 mm with question additional 15 x 12 mm nodule more inferiorly, question metastatic disease. Peripelvic renal cysts. Aortic Atherosclerosis (ICD10-I70.0). Electronically Signed   By: Lavonia Dana M.D.   On: 09/08/2020 15:28   DG Chest 2 View  Result Date: 09/04/2020 CLINICAL DATA:  Pleuritic chest pain EXAM: CHEST - 2 VIEW COMPARISON:  06/05/2019 FINDINGS: Heart is normal size. Lingular atelectasis or scarring. Large mass noted medially in the right upper lobe measuring approximately 5 cm. This is new since prior study. No effusions or acute bony abnormality. IMPRESSION: Approximately 5 cm medial right upper lobe mass. Recommend chest CT for further evaluation. Lingular scarring or atelectasis. Electronically Signed   By: Rolm Baptise M.D.   On: 09/04/2020 16:39   CT CHEST WO CONTRAST  Result Date: 09/08/2020 CLINICAL DATA:  Abnormal chest radiograph with RIGHT upper lobe mass, past history of LEFT breast cancer post mastectomy EXAM: CT CHEST AND ABDOMEN WITHOUT CONTRAST TECHNIQUE: Multidetector CT imaging of the chest and abdomen was performed following the standard protocol without intravenous contrast. Sagittal and coronal MPR images reconstructed from axial data set. Oral contrast was not administered. IV contrast not utilized due to history of contrast allergy. COMPARISON:  CT chest 07/14/2009, CT abdomen and pelvis 08/20/2007 FINDINGS: CT CHEST FINDINGS WITHOUT CONTRAST  Cardiovascular: Atherosclerotic calcifications aorta, proximal great vessels and coronary arteries. Aorta normal caliber. Heart unremarkable. No pericardial effusion. Mediastinum/Nodes: Esophagus unremarkable. Tiny RIGHT thyroid nodule; Not clinically significant; no follow-up imaging recommended (ref: J Am Coll Radiol. 2015 Feb;12(2): 143-50). Single enlarged precarinal lymph node 15 mm short axis image 23, new. No additional thoracic adenopathy. Post LEFT mastectomy and placement of a LEFT breast prosthesis. Lungs/Pleura: Mass identified at posterior aspect of RIGHT upper lobe measuring 4.5 x 5.4 x 5.5 cm consistent with a pulmonary neoplasm. Mass  abuts the pleura over a broad surface and demonstrates posterior chest wall invasion. Few calcifications within mass. Minimal surrounding infiltrate. Adjacent satellite nodule 12 x 10 mm image 45. Tiny subpleural nodule RIGHT upper lobe image 53. Small focus of infiltrate or scarring at minor fissure unchanged. Tiny lung nodule posterior RIGHT lower lobe base image 114 unchanged. Remaining lungs clear. Musculoskeletal: Bone island lower thoracic spine stable. Cortical thinning and bone erosion at the posterior RIGHT fifth rib by the RIGHT upper lobe mass. Mass also abuts the fourth rib without definite bone destruction. CT ABDOMEN FINDINGS WITHOUT CONTRAST Hepatobiliary: Gallbladder and liver unremarkable Pancreas: Atrophic without mass Spleen: Normal appearance Adrenals/Urinary Tract: Normal appearing RIGHT adrenal gland. LEFT adrenal mass 18 x 21 mm, with question an additional 15 x 12 mm nodule more inferiorly. Small peripelvic cysts at both kidneys. No additional renal mass or hydronephrosis. No urinary tract calcification. Stomach/Bowel: Stomach and visualized bowel loops unremarkable Vascular/Lymphatic: Atherosclerotic calcification aorta. Visualized aorta normal caliber. No upper abdominal adenopathy. Other: No free air or free fluid in upper abdomen. No umbilical  hernia. Musculoskeletal: Unremarkable IMPRESSION: 4.5 x 5.4 x 5.5 cm diameter RIGHT upper lobe mass consistent with a pulmonary neoplasm. Mass abuts the pleura over a broad surface and demonstrates posterior chest wall invasion and involvement of the posterior RIGHT fifth rib. Adjacent satellite nodule 12 x 10 mm with tiny nonspecific subpleural nodule RIGHT upper lobe. Single enlarged precarinal lymph node 15 mm short axis, new. LEFT adrenal mass 18 x 21 mm with question additional 15 x 12 mm nodule more inferiorly, question metastatic disease. Peripelvic renal cysts. Aortic Atherosclerosis (ICD10-I70.0). Electronically Signed   By: Lavonia Dana M.D.   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  I am concerned that this could be a similar presentation in the lung but other primary lung cancer could not be excluded at this point.   PLAN: I had a lengthy discussion with the patient and her daughter today about her current condition and further investigation to confirm a diagnosis and possible treatment options. I personally and independently reviewed the scan images and discussed the result and showed the images to the patient and her daughter today. I recommended for the patient to complete the staging work-up by ordering a PET scan to rule out any other metastatic disease in the evaluate the adrenal gland lesions. I also referred the patient to interventional radiology for consideration of CT-guided core biopsy of the posterior right upper lobe lung mass for confirmation of tissue diagnosis. I referred the patient to radiation oncology for  consideration of palliative radiotherapy to the right upper lobe lung mass with the chest wall invasion after the biopsy. For pain management I will start the patient on Percocet 5/325 mg p.o. every 8 hours as needed and hopefully once the patient start palliative radiotherapy she will feel better. I will see her back for follow-up visit in around 2 weeks for evaluation and more detailed discussion of her treatment options based on the final pathology and staging work-up. I will hold on ordering MRI of the brain until the tissue diagnosis is available and confirms primary  lung cancer. The patient and her daughter agreed to the current plan. She was advised to call immediately if she has any concerning symptoms in the interval. The patient voices understanding of current disease status and treatment options and is in agreement with the current care plan.  All questions were answered. The patient knows to call the clinic with any problems, questions or concerns. We can certainly see the patient much sooner if necessary.  Thank you so much for allowing me to participate in the care of [name]. I will continue to follow up the patient with you and assist in her care.  The total time spent in the appointment was 60 minutes.  Disclaimer: This note was dictated with voice recognition software. Similar sounding words can inadvertently be transcribed and may not be corrected upon review.   Eilleen Kempf September 18, 2020, 2:29 PM

## 2020-09-19 ENCOUNTER — Encounter: Payer: Self-pay | Admitting: *Deleted

## 2020-09-19 ENCOUNTER — Encounter (HOSPITAL_COMMUNITY): Payer: Self-pay | Admitting: Radiology

## 2020-09-19 NOTE — Progress Notes (Signed)
Ann A. [facility] Female, 80 y.o., 1941/07/01  MRN:  [id number] Phone:  [phone] Jerilynn Mages)       PCP:  [name], MD Coverage:  [place] With Radiology ([facility]) 10/02/2020 at 7:00 AM           RE: CT Lung Mass Biopsy Received: Today Ann Peaches, MD  Garth Bigness D  Approved for CT guided bx of RUL pulmonary mass. PET CT has not been done, but mass is easily approachable without transgressing lung and is clearly new since 2020.    Prior hx of breast cancer.   HKM        Previous Messages   ----- Message -----  From: Garth Bigness D  Sent: 09/18/2020  5:15 PM EDT  To: [facility] Procedure Requests  Subject: CT Lung Mass Biopsy                Procedure:  CT Lung Mass Biopsy   Reason: Mass of upper lobe of right lung, CT-guided core biopsy of the pleural-based right upper lobe lung mass   History: CT in computer, [place] PET scheduled for 10/02/20   Provider: Curt Bears   Provider Contact: [phone]

## 2020-09-19 NOTE — Progress Notes (Signed)
Elen A. [facility] Female, 81 y.o., 1940-12-20  MRN:  [id number] Phone:  [phone] Jerilynn Mages)       PCP:  [name], MD Coverage:  [place] Medicare  Next Appt With Radiology ([facility]) 10/02/2020 at 7:00 AM           RE: CT Lung Mass Biopsy Received: Leonie Douglas, MD  Caralee Ates, RN  Ok to hold it. Thank you        Previous Messages   ----- Message -----  From: Garth Bigness D  Sent: 09/18/2020  5:20 PM EDT  To: Valrie Hart, RN, Curt Bears, MD  Subject: Melton Alar: CT Lung Mass Biopsy              Patient is on Xarelto and will need to hold for 1 day prior to her biopsy. Please advise if okay to hold, thanks Tasha.  ----- Message -----  From: Garth Bigness D  Sent: 09/18/2020  5:15 PM EDT  To: [facility] Procedure Requests  Subject: CT Lung Mass Biopsy                Procedure:  CT Lung Mass Biopsy   Reason: Mass of upper lobe of right lung, CT-guided core biopsy of the pleural-based right upper lobe lung mass   History: CT in computer, NM PET scheduled for 10/02/20   Provider: Curt Bears   Provider Contact: [phone]

## 2020-09-19 NOTE — Progress Notes (Signed)
I followed up on [name]'s schedule. She is set up for work up and a follow up with Dr. Julien Nordmann.

## 2020-09-20 ENCOUNTER — Other Ambulatory Visit: Payer: Self-pay | Admitting: Medical Oncology

## 2020-09-20 ENCOUNTER — Telehealth: Payer: Self-pay | Admitting: Medical Oncology

## 2020-09-20 DIAGNOSIS — R112 Nausea with vomiting, unspecified: Secondary | ICD-10-CM

## 2020-09-20 MED ORDER — PROCHLORPERAZINE MALEATE 10 MG PO TABS: 10.0000 mg | ORAL_TABLET | Freq: Four times a day (QID) | ORAL | 0 refills | Status: DC | PRN

## 2020-09-20 NOTE — Telephone Encounter (Signed)
Vomiting after taking pain med - Vomited today and yesterday several times after taking an oxycodone.  "I vomited a lot and my pain is terrible. I vomited up the pain pills.I have to have something for this pain". Probable intolerance to oxycodone.

## 2020-09-21 ENCOUNTER — Other Ambulatory Visit: Payer: Self-pay | Admitting: Internal Medicine

## 2020-09-21 ENCOUNTER — Telehealth: Payer: Self-pay | Admitting: Medical Oncology

## 2020-09-21 MED ORDER — GABAPENTIN 100 MG PO CAPS: 100.0000 mg | ORAL_CAPSULE | Freq: Three times a day (TID) | ORAL | 1 refills | Status: AC

## 2020-09-21 NOTE — Telephone Encounter (Signed)
I will send gabapentin 100 mg p.o. 3 times daily to her pharmacy today.  Thank you.

## 2020-09-21 NOTE — Telephone Encounter (Signed)
Pain management update - the percocet is not " touching" her pain at all.   Describes it as feeling like" needles   and knifes in my back and breast"   she is taking compazine before percocet and no longer  Vomiting.  " I realize the mass is causing the pain and I  need to have the bx so [name] can treat it."

## 2020-09-22 ENCOUNTER — Ambulatory Visit (HOSPITAL_COMMUNITY)
Admission: RE | Admit: 2020-09-22 | Discharge: 2020-09-22 | Disposition: A | Discharge status: Home / Self Care | Payer: Medicare Other | Source: Ambulatory Visit | Attending: Internal Medicine | Admitting: Internal Medicine

## 2020-09-22 ENCOUNTER — Other Ambulatory Visit: Payer: Self-pay | Admitting: Student

## 2020-09-22 DIAGNOSIS — Z20822 Contact with and (suspected) exposure to covid-19: Secondary | ICD-10-CM | POA: Diagnosis not present

## 2020-09-22 DIAGNOSIS — Z01812 Encounter for preprocedural laboratory examination: Secondary | ICD-10-CM | POA: Diagnosis not present

## 2020-09-23 LAB — SARS CORONAVIRUS 2 (TAT 6-24 HRS): SARS Coronavirus 2: NEGATIVE

## 2020-09-25 ENCOUNTER — Ambulatory Visit (HOSPITAL_COMMUNITY)
Admission: RE | Admit: 2020-09-25 | Discharge: 2020-09-25 | Disposition: A | Discharge status: Home / Self Care | Payer: Medicare Other | Source: Ambulatory Visit | Attending: Diagnostic Radiology | Admitting: Diagnostic Radiology

## 2020-09-25 ENCOUNTER — Encounter (HOSPITAL_COMMUNITY): Payer: Self-pay

## 2020-09-25 ENCOUNTER — Ambulatory Visit (HOSPITAL_COMMUNITY)
Admission: RE | Admit: 2020-09-25 | Discharge: 2020-09-25 | Disposition: A | Discharge status: Home / Self Care | Payer: Medicare Other | Source: Ambulatory Visit | Attending: Internal Medicine | Admitting: Internal Medicine

## 2020-09-25 ENCOUNTER — Other Ambulatory Visit: Payer: Self-pay

## 2020-09-25 DIAGNOSIS — E538 Deficiency of other specified B group vitamins: Secondary | ICD-10-CM | POA: Insufficient documentation

## 2020-09-25 DIAGNOSIS — R918 Other nonspecific abnormal finding of lung field: Secondary | ICD-10-CM | POA: Diagnosis present

## 2020-09-25 DIAGNOSIS — C3491 Malignant neoplasm of unspecified part of right bronchus or lung: Secondary | ICD-10-CM | POA: Diagnosis not present

## 2020-09-25 DIAGNOSIS — E785 Hyperlipidemia, unspecified: Secondary | ICD-10-CM | POA: Diagnosis not present

## 2020-09-25 DIAGNOSIS — R079 Chest pain, unspecified: Secondary | ICD-10-CM | POA: Diagnosis not present

## 2020-09-25 DIAGNOSIS — Z79899 Other long term (current) drug therapy: Secondary | ICD-10-CM | POA: Diagnosis not present

## 2020-09-25 DIAGNOSIS — Z79890 Hormone replacement therapy (postmenopausal): Secondary | ICD-10-CM | POA: Diagnosis not present

## 2020-09-25 DIAGNOSIS — I10 Essential (primary) hypertension: Secondary | ICD-10-CM | POA: Insufficient documentation

## 2020-09-25 DIAGNOSIS — E119 Type 2 diabetes mellitus without complications: Secondary | ICD-10-CM | POA: Diagnosis not present

## 2020-09-25 DIAGNOSIS — Z794 Long term (current) use of insulin: Secondary | ICD-10-CM | POA: Insufficient documentation

## 2020-09-25 DIAGNOSIS — M549 Dorsalgia, unspecified: Secondary | ICD-10-CM | POA: Insufficient documentation

## 2020-09-25 DIAGNOSIS — Z9889 Other specified postprocedural states: Secondary | ICD-10-CM

## 2020-09-25 DIAGNOSIS — Z9012 Acquired absence of left breast and nipple: Secondary | ICD-10-CM | POA: Insufficient documentation

## 2020-09-25 DIAGNOSIS — Z793 Long term (current) use of hormonal contraceptives: Secondary | ICD-10-CM | POA: Insufficient documentation

## 2020-09-25 DIAGNOSIS — Z853 Personal history of malignant neoplasm of breast: Secondary | ICD-10-CM | POA: Insufficient documentation

## 2020-09-25 DIAGNOSIS — Z7901 Long term (current) use of anticoagulants: Secondary | ICD-10-CM | POA: Insufficient documentation

## 2020-09-25 DIAGNOSIS — Z800 Family history of malignant neoplasm of digestive organs: Secondary | ICD-10-CM | POA: Diagnosis not present

## 2020-09-25 DIAGNOSIS — Z823 Family history of stroke: Secondary | ICD-10-CM | POA: Diagnosis not present

## 2020-09-25 DIAGNOSIS — Z96653 Presence of artificial knee joint, bilateral: Secondary | ICD-10-CM | POA: Diagnosis not present

## 2020-09-25 DIAGNOSIS — Z7984 Long term (current) use of oral hypoglycemic drugs: Secondary | ICD-10-CM | POA: Diagnosis not present

## 2020-09-25 LAB — CBC
HCT: 35.5 % — ABNORMAL LOW (ref 36.0–46.0)
Hemoglobin: 11.3 g/dL — ABNORMAL LOW (ref 12.0–15.0)
MCH: 28.3 pg (ref 26.0–34.0)
MCHC: 31.8 g/dL (ref 30.0–36.0)
MCV: 88.8 fL (ref 80.0–100.0)
Platelets: 333 10*3/uL (ref 150–400)
RBC: 4 MIL/uL (ref 3.87–5.11)
RDW: 14 % (ref 11.5–15.5)
WBC: 8.2 10*3/uL (ref 4.0–10.5)
nRBC: 0 % (ref 0.0–0.2)

## 2020-09-25 LAB — GLUCOSE, CAPILLARY: Glucose-Capillary: 96 mg/dL (ref 70–99)

## 2020-09-25 LAB — PROTIME-INR
INR: 1.1 (ref 0.8–1.2)
Prothrombin Time: 13.4 seconds (ref 11.4–15.2)

## 2020-09-25 MED ORDER — MIDAZOLAM HCL 2 MG/2ML IJ SOLN
INTRAMUSCULAR | Status: AC
  Filled 2020-09-25: qty 2

## 2020-09-25 MED ORDER — FENTANYL CITRATE (PF) 100 MCG/2ML IJ SOLN
INTRAMUSCULAR | Status: AC | PRN
  Administered 2020-09-25: 25 ug via INTRAVENOUS
  Administered 2020-09-25: 50 ug via INTRAVENOUS
  Administered 2020-09-25: 25 ug via INTRAVENOUS

## 2020-09-25 MED ORDER — LIDOCAINE HCL 1 % IJ SOLN
INTRAMUSCULAR | Status: AC
  Filled 2020-09-25: qty 20

## 2020-09-25 MED ORDER — OXYCODONE-ACETAMINOPHEN 5-325 MG PO TABS
ORAL_TABLET | ORAL | Status: AC
  Administered 2020-09-25: 1 via ORAL
  Filled 2020-09-25: qty 1

## 2020-09-25 MED ORDER — SODIUM CHLORIDE 0.9 % IV SOLN: INTRAVENOUS | Status: DC

## 2020-09-25 MED ORDER — OXYCODONE-ACETAMINOPHEN 5-325 MG PO TABS
1.0000 | ORAL_TABLET | Freq: Once | ORAL | Status: AC
  Administered 2020-09-25: 1 via ORAL

## 2020-09-25 MED ORDER — SODIUM CHLORIDE 0.9 % IV SOLN
INTRAVENOUS | Status: AC | PRN
Start: 2020-09-25 — End: 2020-09-25
  Administered 2020-09-25: 10 mL/h via INTRAVENOUS

## 2020-09-25 MED ORDER — MIDAZOLAM HCL 2 MG/2ML IJ SOLN
INTRAMUSCULAR | Status: AC | PRN
  Administered 2020-09-25 (×2): 0.5 mg via INTRAVENOUS
  Administered 2020-09-25: 1 mg via INTRAVENOUS

## 2020-09-25 MED ORDER — HYDROCODONE-ACETAMINOPHEN 5-325 MG PO TABS: 1.0000 | ORAL_TABLET | ORAL | Status: DC | PRN

## 2020-09-25 MED ORDER — FENTANYL CITRATE (PF) 100 MCG/2ML IJ SOLN
INTRAMUSCULAR | Status: AC
  Filled 2020-09-25: qty 2

## 2020-09-25 NOTE — H&P (Signed)
Chief Complaint: Patient was seen in consultation today for right lung mass biopsy at the request of [facility]  Referring Physician(s): Mohamed,Mohamed  Supervising Physician: Markus Daft  Patient Status: [facility] - Out-pt  History of Present Illness: [name] is a 80 y.o. 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 Mass abuts the pleura over a broad surface and demonstrates posterior chest wall invasion and involvement of the posterior RIGHT fifth rib. Adjacent satellite nodule 12 x 10 mm with tiny nonspecific subpleural nodule RIGHT upper lobe. Single enlarged precarinal lymph node 15 mm short axis, new. LEFT adrenal mass 18 x 21 mm with question additional 15 x 12 mm nodule more inferiorly, question metastatic disease. Peripelvic renal cysts.   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  I am concerned that this could be a similar presentation in the lung but other primary lung cancer could not be excluded at this point.  Scheduled today for R lung mass biopsy   Past Medical History:  Diagnosis Date  . Arthritis   . B12 deficiency anemia   . Diabetes mellitus ORAL AND INSULIN MEDS  . Fibrosis of knee joint RIGHT  . Heart palpitations SINCE 2008 TAKES BETA BLOCKER   . 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 Hyperlipidemia   . Hypertension   . Neuropathy, diabetic ([place]) RIGHT FOOT MILD NUMBNESS  . PONV (postoperative nausea and vomiting)     Past Surgical History:  Procedure Laterality Date  . ABDOMINAL HYSTERECTOMY    . BALLOON DILATION N/A 06/12/2015   Procedure: BALLOON DILATION;  Surgeon: [name], MD;  Location: [facility];  Service: Endoscopy;  Laterality: N/A;  . BREAST LUMPECTOMY  07-27-2007   LEFT   . CARDIAC CATHETERIZATION  2001   MIMINAL PLAQUE RCA, CIRCUMFLEX, LAD;   NORMAL LVF  . CERVICAL FUSION  1996   C3 - 5  . ESOPHAGOGASTRODUODENOSCOPY (EGD) WITH PROPOFOL N/A 06/12/2015   Procedure: ESOPHAGOGASTRODUODENOSCOPY (EGD) WITH PROPOFOL;  Surgeon: [name], MD;  Location: [facility];  Service: Endoscopy;  Laterality: N/A;  . EXCHANGE LEFT BREAST IMPLANT/ CAPSULOTOMY/ RIGHT MASTOPEXY  11-28-2008  . JOINT REPLACEMENT    . KNEE ARTHROSCOPY  1999   RIGHT  . KNEE ARTHROTOMY  04-03-2011   RIGHT KNEE/ SCAR EXCISION/ POLYTHYLENE EXCHANGE  . KNEE CLOSED REDUCTION  07/31/2011   Procedure: CLOSED MANIPULATION KNEE;  Surgeon: [name], MD;  Location: [place];  Service: Orthopedics;  Laterality: Right;  . LEFT BREAST BX ;  X2  1976  . MANIPULATION KNEE JOINT  04-02-2006   CLOSED --  RIGHT   . TOTAL ABDOMINAL HYSTERECTOMY W/ BILATERAL  SALPINGOOPHORECTOMY  1989   AND APPENDECTOMY  . TOTAL KNEE ARTHROPLASTY  02-03-2006   RIGHT  . TOTAL KNEE ARTHROPLASTY Left 10/23/2015   Procedure: TOTAL LEFT KNEE ARTHROPLASTY;  Surgeon: [name], MD;  Location: [facility];  Service: Orthopedics;  Laterality: Left;  . TOTAL LEFT MASTECTOMY  09-29-2007  . TUBAL LIGATION  1976  . URETHRAL DILATION  1976    Allergies: Contrast media [iodinated diagnostic agents], Epinephrine, and Sulfa  antibiotics  Medications: Prior to Admission medications   Medication Sig Start Date End Date Taking? Authorizing Provider  cetirizine (ZYRTEC) 10 MG tablet Take 10 mg by mouth daily.   Yes [provider]  furosemide (LASIX) 40 MG tablet Take 20 mg by mouth daily.   Yes [provider]  gabapentin (NEURONTIN) 100 MG capsule Take 1 capsule (100 mg total) by mouth 3 (three) times daily. 09/21/20  Yes Curt Bears, MD  insulin glargine (LANTUS) 100 UNIT/ML injection Inject 55 Units into the skin every morning.   Yes [provider]  JANUMET 50-500 MG tablet Take 1 tablet by mouth 2 (two) times daily. 04/18/15  Yes [provider]  lisinopril (PRINIVIL,ZESTRIL) 5 MG tablet Take 2.5 mg by mouth daily.    Yes [provider]  methocarbamol (ROBAXIN) 500 MG tablet Take 1 tablet (500 mg total) by mouth every 6 (six) hours as needed for muscle spasms. 10/24/15  Yes Constable, Amber, PA-C  metoprolol succinate (TOPROL-XL) 50 MG 24 hr tablet Take 50 mg by mouth daily. Take with or immediately following a meal.   Yes [provider]  oxyCODONE-acetaminophen (PERCOCET/ROXICET) 5-325 MG tablet Take 1 tablet by mouth every 8 (eight) hours as needed for severe pain. 09/18/20  Yes Curt Bears, MD  prochlorperazine (COMPAZINE) 10 MG tablet Take 1 tablet (10 mg total) by mouth every 6 (six) hours as needed for nausea or vomiting. Take one tablet ( 10 mg) 30 minutes prior to pain med every 6 hours prn. 09/20/20  Yes Curt Bears, MD  rosuvastatin (CRESTOR) 40 MG tablet Take 10 mg by mouth daily.   Yes [provider]  estradiol (ESTRACE) 0.1 MG/GM vaginal cream Place 1 Applicatorful vaginally daily as needed (Irritation or dryness).    [provider]  estradiol (ESTRACE) 0.5 MG tablet Take 0.5 mg by mouth daily.    [provider]  rivaroxaban (XARELTO) 10 MG TABS tablet Take 1 tablet (10 mg total) by mouth daily with  breakfast. 10/24/15   Cecilio Asper, Amber, PA-C  tobramycin-dexamethasone [facility]) ophthalmic solution Place 1 drop into the left eye daily as needed. (redness) 06/15/15   [provider]     Family History  Problem Relation Age of Onset  . Heart disease Mother   . Stroke Father   . Cancer Sister        stomach  . Cancer Brother        colon  . Breast cancer Neg Hx     Social History   Socioeconomic History  . Marital status: Married    Spouse name: Not on file  . Number of children: Not on file  . Years of education: Not on file  . Highest education level: Not on file  Occupational History  . Not on file  Tobacco Use  . Smoking status: Never Smoker  . Smokeless tobacco: Never Used  Vaping Use  . Vaping Use: Never used  Substance and Sexual Activity  . Alcohol use: No  . Drug use: No  .  Sexual activity: Not on file  Other Topics Concern  . Not on file  Social History Narrative  . Not on file   Social Determinants of Health   Financial Resource Strain: Not on file  Food Insecurity: Not on file  Transportation Needs: Not on file  Physical Activity: Not on file  Stress: Not on file  Social Connections: Not on file    Review of Systems: A 12 point ROS discussed and pertinent positives are indicated in the HPI above.  All other systems are negative.  Review of Systems  Constitutional: Positive for fatigue and unexpected weight change. Negative for appetite change.  Respiratory: Negative for cough and shortness of breath.   Cardiovascular: Positive for chest pain.  Gastrointestinal: Negative for abdominal pain.  Musculoskeletal: Positive for back pain.  Psychiatric/Behavioral: Negative for behavioral problems and confusion.    Vital Signs: BP (!) 155/71   Pulse 82   Temp 98.2 F (36.8 C)   Ht 5\' 7"  (1.702 m)   Wt 161 lb (73 kg)   SpO2 99%   BMI 25.22 kg/m   Physical Exam Vitals reviewed.  HENT:     Mouth/Throat:     Mouth: Mucous membranes  are moist.  Cardiovascular:     Rate and Rhythm: Normal rate and regular rhythm.     Heart sounds: Normal heart sounds.  Pulmonary:     Effort: Pulmonary effort is normal.     Breath sounds: Normal breath sounds.  Abdominal:     Palpations: Abdomen is soft.  Musculoskeletal:        General: Normal range of motion.  Skin:    General: Skin is warm.  Neurological:     Mental Status: She is oriented to person, place, and time.  Psychiatric:        Behavior: Behavior normal.     Imaging: CT ABDOMEN WO CONTRAST  Result Date: 09/08/2020 CLINICAL DATA:  Abnormal chest radiograph with RIGHT upper lobe mass, past history of LEFT breast cancer post mastectomy EXAM: CT CHEST AND ABDOMEN WITHOUT CONTRAST TECHNIQUE: Multidetector CT imaging of the chest and abdomen was performed following the standard protocol without intravenous contrast. Sagittal and coronal MPR images reconstructed from axial data set. Oral contrast was not administered. IV contrast not utilized due to history of contrast allergy. COMPARISON:  CT chest 07/14/2009, CT abdomen and pelvis 08/20/2007 FINDINGS: CT CHEST FINDINGS WITHOUT CONTRAST Cardiovascular: Atherosclerotic calcifications aorta, proximal great vessels and coronary arteries. Aorta normal caliber. Heart unremarkable. No pericardial effusion. Mediastinum/Nodes: Esophagus unremarkable. Tiny RIGHT thyroid nodule; Not clinically significant; no follow-up imaging recommended (ref: J Am Coll Radiol. 2015 Feb;12(2): 143-50). Single enlarged precarinal lymph node 15 mm short axis image 23, new. No additional thoracic adenopathy. Post LEFT mastectomy and placement of a LEFT breast prosthesis. Lungs/Pleura: Mass identified at posterior aspect of RIGHT upper lobe measuring 4.5 x 5.4 x 5.5 cm consistent with a pulmonary neoplasm. Mass abuts the pleura over a broad surface and demonstrates posterior chest wall invasion. Few calcifications within mass. Minimal surrounding infiltrate.  Adjacent satellite nodule 12 x 10 mm image 45. Tiny subpleural nodule RIGHT upper lobe image 53. Small focus of infiltrate or scarring at minor fissure unchanged. Tiny lung nodule posterior RIGHT lower lobe base image 114 unchanged. Remaining lungs clear. Musculoskeletal: Bone island lower thoracic spine stable. Cortical thinning and bone erosion at the posterior RIGHT fifth rib by the RIGHT upper lobe mass. Mass also abuts the fourth rib without definite bone destruction. CT ABDOMEN FINDINGS  WITHOUT CONTRAST Hepatobiliary: Gallbladder and liver unremarkable Pancreas: Atrophic without mass Spleen: Normal appearance Adrenals/Urinary Tract: Normal appearing RIGHT adrenal gland. LEFT adrenal mass 18 x 21 mm, with question an additional 15 x 12 mm nodule more inferiorly. Small peripelvic cysts at both kidneys. No additional renal mass or hydronephrosis. No urinary tract calcification. Stomach/Bowel: Stomach and visualized bowel loops unremarkable Vascular/Lymphatic: Atherosclerotic calcification aorta. Visualized aorta normal caliber. No upper abdominal adenopathy. Other: No free air or free fluid in upper abdomen. No umbilical hernia. Musculoskeletal: Unremarkable IMPRESSION: 4.5 x 5.4 x 5.5 cm diameter RIGHT upper lobe mass consistent with a pulmonary neoplasm. Mass abuts the pleura over a broad surface and demonstrates posterior chest wall invasion and involvement of the posterior RIGHT fifth rib. Adjacent satellite nodule 12 x 10 mm with tiny nonspecific subpleural nodule RIGHT upper lobe. Single enlarged precarinal lymph node 15 mm short axis, new. LEFT adrenal mass 18 x 21 mm with question additional 15 x 12 mm nodule more inferiorly, question metastatic disease. Peripelvic renal cysts. Aortic Atherosclerosis (ICD10-I70.0). Electronically Signed   By: Lavonia Dana M.D.   On: 09/08/2020 15:28   DG Chest 2 View  Result Date: 09/04/2020 CLINICAL DATA:  Pleuritic chest pain EXAM: CHEST - 2 VIEW COMPARISON:   06/05/2019 FINDINGS: Heart is normal size. Lingular atelectasis or scarring. Large mass noted medially in the right upper lobe measuring approximately 5 cm. This is new since prior study. No effusions or acute bony abnormality. IMPRESSION: Approximately 5 cm medial right upper lobe mass. Recommend chest CT for further evaluation. Lingular scarring or atelectasis. Electronically Signed   By: Rolm Baptise M.D.   On: 09/04/2020 16:39   CT CHEST WO CONTRAST  Result Date: 09/08/2020 CLINICAL DATA:  Abnormal chest radiograph with RIGHT upper lobe mass, past history of LEFT breast cancer post mastectomy EXAM: CT CHEST AND ABDOMEN WITHOUT CONTRAST TECHNIQUE: Multidetector CT imaging of the chest and abdomen was performed following the standard protocol without intravenous contrast. Sagittal and coronal MPR images reconstructed from axial data set. Oral contrast was not administered. IV contrast not utilized due to history of contrast allergy. COMPARISON:  CT chest 07/14/2009, CT abdomen and pelvis 08/20/2007 FINDINGS: CT CHEST FINDINGS WITHOUT CONTRAST Cardiovascular: Atherosclerotic calcifications aorta, proximal great vessels and coronary arteries. Aorta normal caliber. Heart unremarkable. No pericardial effusion. Mediastinum/Nodes: Esophagus unremarkable. Tiny RIGHT thyroid nodule; Not clinically significant; no follow-up imaging recommended (ref: J Am Coll Radiol. 2015 Feb;12(2): 143-50). Single enlarged precarinal lymph node 15 mm short axis image 23, new. No additional thoracic adenopathy. Post LEFT mastectomy and placement of a LEFT breast prosthesis. Lungs/Pleura: Mass identified at posterior aspect of RIGHT upper lobe measuring 4.5 x 5.4 x 5.5 cm consistent with a pulmonary neoplasm. Mass abuts the pleura over a broad surface and demonstrates posterior chest wall invasion. Few calcifications within mass. Minimal surrounding infiltrate. Adjacent satellite nodule 12 x 10 mm image 45. Tiny subpleural nodule RIGHT  upper lobe image 53. Small focus of infiltrate or scarring at minor fissure unchanged. Tiny lung nodule posterior RIGHT lower lobe base image 114 unchanged. Remaining lungs clear. Musculoskeletal: Bone island lower thoracic spine stable. Cortical thinning and bone erosion at the posterior RIGHT fifth rib by the RIGHT upper lobe mass. Mass also abuts the fourth rib without definite bone destruction. CT ABDOMEN FINDINGS WITHOUT CONTRAST Hepatobiliary: Gallbladder and liver unremarkable Pancreas: Atrophic without mass Spleen: Normal appearance Adrenals/Urinary Tract: Normal appearing RIGHT adrenal gland. LEFT adrenal mass 18 x 21 mm, with question an additional  15 x 12 mm nodule more inferiorly. Small peripelvic cysts at both kidneys. No additional renal mass or hydronephrosis. No urinary tract calcification. Stomach/Bowel: Stomach and visualized bowel loops unremarkable Vascular/Lymphatic: Atherosclerotic calcification aorta. Visualized aorta normal caliber. No upper abdominal adenopathy. Other: No free air or free fluid in upper abdomen. No umbilical hernia. Musculoskeletal: Unremarkable IMPRESSION: 4.5 x 5.4 x 5.5 cm diameter RIGHT upper lobe mass consistent with a pulmonary neoplasm. Mass abuts the pleura over a broad surface and demonstrates posterior chest wall invasion and involvement of the posterior RIGHT fifth rib. Adjacent satellite nodule 12 x 10 mm with tiny nonspecific subpleural nodule RIGHT upper lobe. Single enlarged precarinal lymph node 15 mm short axis, new. LEFT adrenal mass 18 x 21 mm with question additional 15 x 12 mm nodule more inferiorly, question metastatic disease. Peripelvic renal cysts. Aortic Atherosclerosis (ICD10-I70.0). Electronically Signed   By: Lavonia Dana M.D.   On: 09/08/2020 15:28    Labs:  CBC: Recent Labs    09/18/20 1406  WBC 8.0  HGB 11.6*  HCT 35.8*  PLT 332    COAGS: No results for input(s): INR, APTT in the last 8760 hours.  BMP: Recent Labs     09/18/20 1406  NA 140  K 3.3*  CL 104  CO2 28  GLUCOSE 235*  BUN 14  CALCIUM 9.6  CREATININE 0.97  GFRNONAA 59*    LIVER FUNCTION TESTS: Recent Labs    09/18/20 1406  BILITOT 0.5  AST 11*  ALT 9  ALKPHOS 77  PROT 7.1  ALBUMIN 3.4*    TUMOR MARKERS: No results for input(s): AFPTM, CEA, CA199, CHROMGRNA in the last 8760 hours.  Assessment and Plan:  Hx breast mass 2009-- sarcoma New right lung mass Now scheduled for biopsy of same Risks and benefits of CT guided lung nodule biopsy was discussed with the patient including, but not limited to bleeding, hemoptysis, respiratory failure requiring intubation, infection, pneumothorax requiring chest tube placement, stroke from air embolism or even death.  All of the patient's questions were answered and the patient is agreeable to proceed. Consent signed and in chart  Thank you for this interesting consult.  I greatly enjoyed meeting [name] and look forward to participating in their care.  A copy of this report was sent to the requesting provider on this date.  Electronically Signed: Lavonia Drafts, PA-C 09/25/2020, 10:44 AM   I spent a total of  30 Minutes   in face to face in clinical consultation, greater than 50% of which was counseling/coordinating care for right lung mass biopsy

## 2020-09-25 NOTE — Discharge Instructions (Addendum)
Lung Biopsy  A lung biopsy is a procedure to remove a tissue sample from your lung to be checked in a lab. The tissue can be examined under a microscope to help diagnose various lung disorders. There are three types of lung biopsies:  Needle biopsy. In this type, the sample is removed with a needle.  Bronchoscopy. In this type, the sample is removed through a flexible tube that is inserted into the lungs through the mouth and windpipe (bronchoscope).  Open biopsy. In this type, the sample is removed through an incision made through the chest wall. Tell a health care provider about:  Any allergies you have.  All medicines you are taking, including vitamins, herbs, eye drops, creams, and over-the-counter medicines.  Any problems you or family members have had with anesthetic medicines.  Any blood disorders or bleeding problems that you have.  Any surgeries you have had.  Any medical conditions you have.  Whether you are pregnant or may be pregnant. What are the risks? Generally, this is a safe procedure. However, problems may occur, including:  Collapsed lung (pneumothorax).  Bleeding.  Infection.  Pain. What happens before the procedure? Staying hydrated Follow instructions from your health care provider about hydration, which may include:  Up to 2 hours before the procedure - you may continue to drink clear liquids, such as water, clear fruit juice, black coffee, and plain tea. Eating and drinking restrictions Follow instructions from your health care provider about eating and drinking, which may include:  8 hours before the procedure - stop eating heavy meals or foods, such as meat, fried foods, or fatty foods.  6 hours before the procedure - stop eating light meals or foods, such as toast or cereal.  6 hours before the procedure - stop drinking milk or drinks that contain milk.  2 hours before the procedure - stop drinking clear liquids. Medicines Ask your health  care provider about:  Changing or stopping your regular medicines. This is especially important if you are taking diabetes medicines or blood thinners.  Taking medicines such as aspirin and ibuprofen. These medicines can thin your blood. Do not take these medicines unless your health care provider tells you to take them.  Taking over-the-counter medicines, vitamins, herbs, and supplements. Surgery safety Ask your health care provider:  How your surgery site will be marked.  What steps will be taken to help prevent infection. These may include: ? Removing hair at the surgery site. ? Washing skin with a germ-killing soap. ? Receiving antibiotic medicine. General instructions  Do not use any products that contain nicotine or tobacco for at least 4 weeks before the procedure. These products include cigarettes, e-cigarettes, and chewing tobacco. If you need help quitting, ask your health care provider.  Plan to have someone take you home from the hospital or clinic. What happens during the procedure?  An IV may be inserted into one of your veins.  You may be given one or both of the following: ? A medicine to help you relax (sedative) during the procedure. ? A medicine to numb the area where the biopsy sample will be taken (local anesthetic). ? A medicine to make you sleep through the procedure (general anesthetic).  If you have a needle biopsy: ? A biopsy needle will be inserted into your lung. A CT scanner may be used to guide the needle to the right place. ? The needle will be used to collect the tissue sample. ? A bandage (dressing) will be  applied over the area where the needle was inserted. ? You will be asked to apply pressure to the bandage for several minutes to ensure there is minimal bleeding.  If you have bronchoscopy: ? A bronchoscope will be inserted into your lungs through your mouth or nose. ? A needle or forceps will be passed through the bronchoscope to remove the  tissue sample.  If you have an open biopsy: ? An incision will be made in your chest. ? The tissue sample will be removed using surgical tools. ? The incision will be closed with skin glue, skin adhesive strips, or stitches (sutures). The exact procedure may vary among health care providers and hospitals.   What happens after the procedure?  Your blood pressure, heart rate, breathing rate, and blood oxygen level will be monitored until you leave the hospital or clinic.  If a bronchoscope was used, you may have a cough and some soreness in your throat.  If you were given a sedative during the procedure, it can affect you for several hours. Do not drive or operate machinery until your health care provider says that it is safe. Summary  A lung biopsy is a procedure to remove a tissue sample from your lung. The sample is examined to help diagnose various lung disorders.  A lung biopsy may be done using a needle, or by inserting a tube through the mouth and windpipe, or through an incision in the chest.  After your lung biopsy, you may have a cough and soreness in your throat. You may also have soreness at the chest wall at the biopsy site. This information is not intended to replace advice given to you by your health care provider. Make sure you discuss any questions you have with your health care provider. Document Revised: 06/05/2019 Document Reviewed: 06/05/2019 Elsevier Patient Education  2021 [place]. Moderate Conscious Sedation, Adult Sedation is the use of medicines to promote relaxation and to relieve discomfort and anxiety. Moderate conscious sedation is a type of sedation. Under moderate conscious sedation, you are less alert than normal, but you are still able to respond to instructions, touch, or both. Moderate conscious sedation is used during short medical and dental procedures. It is milder than deep sedation, which is a type of sedation under which you cannot be easily  woken up. It is also milder than general anesthesia, which is the use of medicines to make you unconscious. Moderate conscious sedation allows you to return to your regular activities sooner. Tell a health care provider about:  Any allergies you have.  All medicines you are taking, including vitamins, herbs, eye drops, creams, and over-the-counter medicines.  Any use of steroids. This includes steroids taken by mouth or as a cream.  Any problems you or family members have had with sedatives and anesthetic medicines.  Any blood disorders you have.  Any surgeries you have had.  Any medical conditions you have, such as sleep apnea.  Whether you are pregnant or may be pregnant.  Any use of cigarettes, alcohol, marijuana, or drugs. What are the risks? Generally, this is a safe procedure. However, problems may occur, including:  Getting too much medicine (oversedation).  Nausea.  Allergic reaction to medicines.  Trouble breathing. If this happens, a breathing tube may be used. It will be removed when you are awake and breathing on your own.  Heart trouble.  Lung trouble.  Confusion that gets better with time (emergence delirium). What happens before the procedure? Staying hydrated Follow  instructions from your health care provider about hydration, which may include:  Up to 2 hours before the procedure - you may continue to drink clear liquids, such as water, clear fruit juice, black coffee, and plain tea. Eating and drinking restrictions Follow instructions from your health care provider about eating and drinking, which may include:  8 hours before the procedure - stop eating heavy meals or foods, such as meat, fried foods, or fatty foods.  6 hours before the procedure - stop eating light meals or foods, such as toast or cereal.  6 hours before the procedure - stop drinking milk or drinks that contain milk.  2 hours before the procedure - stop drinking clear  liquids. Medicines Ask your health care provider about:  Changing or stopping your regular medicines. This is especially important if you are taking diabetes medicines or blood thinners.  Taking medicines such as aspirin and ibuprofen. These medicines can thin your blood. Do not take these medicines unless your health care provider tells you to take them.  Taking over-the-counter medicines, vitamins, herbs, and supplements. Tests and exams  You will have a physical exam.  You may have blood tests done to show how well: ? Your kidneys and liver work. ? Your blood clots. General instructions  Plan to have a responsible adult take you home from the hospital or clinic.  If you will be going home right after the procedure, plan to have a responsible adult care for you for the time you are told. This is important. What happens during the procedure?  You will be given the sedative. The sedative may be given: ? As a pill that you will swallow. It can also be inserted into the rectum. ? As a spray through the nose. ? As an injection into the muscle. ? As an injection into the vein through an IV.  You may be given oxygen as needed.  Your breathing, heart rate, and blood pressure will be monitored during the procedure.  The medical or dental procedure will be done. The procedure may vary among health care providers and hospitals.   What happens after the procedure?  Your blood pressure, heart rate, breathing rate, and blood oxygen level will be monitored until you leave the hospital or clinic.  You will get fluids through your IV if needed.  Do not drive or operate machinery until your health care provider says that it is safe. Summary  Sedation is the use of medicines to promote relaxation and to relieve discomfort and anxiety. Moderate conscious sedation is a type of sedation that is used during short medical and dental procedures.  Tell the health care provider about any medical  conditions that you have and about all the medicines that you are taking.  You will be given the sedative as a pill, a spray through the nose, an injection into the muscle, or an injection into the vein through an IV. Vital signs are monitored during the sedation.  Moderate conscious sedation allows you to return to your regular activities sooner. This information is not intended to replace advice given to you by your health care provider. Make sure you discuss any questions you have with your health care provider. Document Revised: 10/22/2019 Document Reviewed: 05/20/2019 Elsevier Patient Education  2021 [employer].

## 2020-09-25 NOTE — Procedures (Signed)
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  Complications: None     EBL: less than 5 ml  Plan: CXR in 1 hour   [name] R. Anselm Pancoast, MD  Pager: [phone]

## 2020-09-26 NOTE — Telephone Encounter (Signed)
Oxycodone dose - Pt stated her pain has improved in her breast after starting Gabapentin, but not the back pain .   " Can I take the oxycodone more frequently than 8 hours ?".  Per Dr Julien Nordmann I instructed pt she can take oxycodone 1 tab every 6 hours prn.

## 2020-09-28 ENCOUNTER — Ambulatory Visit (HOSPITAL_COMMUNITY): Payer: Medicare Other

## 2020-09-28 NOTE — Progress Notes (Addendum)
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 Chest x-ray ordered by Dr Inda Merlin showed right upper lobe mass.   Biopsies of right lung (if applicable) revealed:  5/53/7482  Tobacco/Marijuana/Snuff/ETOH use: no  Past/Anticipated interventions by cardiothoracic surgery, if any: 09/25/2020 - CT-GUIDED CORE BIOPSY OF RIGHT LUNG MASS  Past/Anticipated interventions by medical oncology, if any:  Dr. Julien Nordmann    Signs/Symptoms  Weight changes, if any: decreased appetite, maybe lost 5-6 lbs.  Respiratory complaints, if LMB:[id number]  Hemoptysis, if any: no  Pain issues, if any:  Yes; right shoulder blade area  SAFETY ISSUES:  Prior radiation? no  Pacemaker/ICD? no   Possible current pregnancy?no  Is the patient on methotrexate? no  Current Complaints / other details:  Patient is married, husband is under hospice at home (pulmonary fibrous).  Patient has son and daughter.  Son [name] is in attendance today.  Biggest concern for patient is the amount of pain she is experiencing and what her prognosis is.  She has a great support system with family and church.    Vitals:   10/05/20 1349  BP: 121/64  Pulse: 93  Resp: 18  Temp: 97.7 F (36.5 C)  SpO2: 98%  Weight: 161 lb 6.4 oz (73.2 kg)  Height: 5\' 7"  (1.702 m)

## 2020-09-29 LAB — SURGICAL PATHOLOGY

## 2020-10-02 ENCOUNTER — Other Ambulatory Visit: Payer: Self-pay | Admitting: Internal Medicine

## 2020-10-02 ENCOUNTER — Other Ambulatory Visit: Payer: Self-pay

## 2020-10-02 ENCOUNTER — Other Ambulatory Visit: Payer: Self-pay | Admitting: Physician Assistant

## 2020-10-02 ENCOUNTER — Ambulatory Visit (HOSPITAL_COMMUNITY)
Admission: RE | Admit: 2020-10-02 | Discharge: 2020-10-02 | Disposition: A | Discharge status: Home / Self Care | Payer: Medicare Other | Source: Ambulatory Visit | Attending: Internal Medicine | Admitting: Internal Medicine

## 2020-10-02 DIAGNOSIS — C50919 Malignant neoplasm of unspecified site of unspecified female breast: Secondary | ICD-10-CM

## 2020-10-02 DIAGNOSIS — R918 Other nonspecific abnormal finding of lung field: Secondary | ICD-10-CM | POA: Insufficient documentation

## 2020-10-02 DIAGNOSIS — I2510 Atherosclerotic heart disease of native coronary artery without angina pectoris: Secondary | ICD-10-CM | POA: Diagnosis not present

## 2020-10-02 DIAGNOSIS — I700 Atherosclerosis of aorta: Secondary | ICD-10-CM | POA: Diagnosis not present

## 2020-10-02 DIAGNOSIS — J90 Pleural effusion, not elsewhere classified: Secondary | ICD-10-CM | POA: Insufficient documentation

## 2020-10-02 LAB — GLUCOSE, CAPILLARY: Glucose-Capillary: 99 mg/dL (ref 70–99)

## 2020-10-02 MED ORDER — FLUDEOXYGLUCOSE F - 18 (FDG) INJECTION
7.2000 | Freq: Once | INTRAVENOUS | Status: AC | PRN
  Administered 2020-10-02: 7.8 via INTRAVENOUS

## 2020-10-03 ENCOUNTER — Inpatient Hospital Stay: Payer: Medicare Other

## 2020-10-03 ENCOUNTER — Other Ambulatory Visit: Payer: Self-pay

## 2020-10-03 ENCOUNTER — Encounter: Payer: Self-pay | Admitting: *Deleted

## 2020-10-03 ENCOUNTER — Inpatient Hospital Stay (HOSPITAL_BASED_OUTPATIENT_CLINIC_OR_DEPARTMENT_OTHER): Payer: Medicare Other | Admitting: Internal Medicine

## 2020-10-03 ENCOUNTER — Encounter: Payer: Self-pay | Admitting: Internal Medicine

## 2020-10-03 VITALS — BP 133/71 | HR 93 | Temp 97.8°F | Resp 16 | Ht 67.0 in | Wt 162.4 lb

## 2020-10-03 DIAGNOSIS — C419 Malignant neoplasm of bone and articular cartilage, unspecified: Secondary | ICD-10-CM | POA: Diagnosis not present

## 2020-10-03 DIAGNOSIS — C44591 Other specified malignant neoplasm of skin of breast: Secondary | ICD-10-CM | POA: Diagnosis not present

## 2020-10-03 DIAGNOSIS — C50919 Malignant neoplasm of unspecified site of unspecified female breast: Secondary | ICD-10-CM

## 2020-10-03 LAB — CBC WITH DIFFERENTIAL (CANCER CENTER ONLY)
Abs Immature Granulocytes: 0.03 10*3/uL (ref 0.00–0.07)
Basophils Absolute: 0 10*3/uL (ref 0.0–0.1)
Basophils Relative: 0 %
Eosinophils Absolute: 0 10*3/uL (ref 0.0–0.5)
Eosinophils Relative: 0 %
HCT: 34.6 % — ABNORMAL LOW (ref 36.0–46.0)
Hemoglobin: 11 g/dL — ABNORMAL LOW (ref 12.0–15.0)
Immature Granulocytes: 0 %
Lymphocytes Relative: 19 %
Lymphs Abs: 1.6 10*3/uL (ref 0.7–4.0)
MCH: 28.1 pg (ref 26.0–34.0)
MCHC: 31.8 g/dL (ref 30.0–36.0)
MCV: 88.3 fL (ref 80.0–100.0)
Monocytes Absolute: 0.8 10*3/uL (ref 0.1–1.0)
Monocytes Relative: 9 %
Neutro Abs: 6.2 10*3/uL (ref 1.7–7.7)
Neutrophils Relative %: 72 %
Platelet Count: 355 10*3/uL (ref 150–400)
RBC: 3.92 MIL/uL (ref 3.87–5.11)
RDW: 14.5 % (ref 11.5–15.5)
WBC Count: 8.7 10*3/uL (ref 4.0–10.5)
nRBC: 0 % (ref 0.0–0.2)

## 2020-10-03 LAB — CMP (CANCER CENTER ONLY)
ALT: 8 U/L (ref 0–44)
AST: 11 U/L — ABNORMAL LOW (ref 15–41)
Albumin: 3.1 g/dL — ABNORMAL LOW (ref 3.5–5.0)
Alkaline Phosphatase: 69 U/L (ref 38–126)
Anion gap: 14 (ref 5–15)
BUN: 9 mg/dL (ref 8–23)
CO2: 30 mmol/L (ref 22–32)
Calcium: 8.9 mg/dL (ref 8.9–10.3)
Chloride: 99 mmol/L (ref 98–111)
Creatinine: 0.76 mg/dL (ref 0.44–1.00)
GFR, Estimated: >60 mL/min (ref >60)
Glucose, Bld: 115 mg/dL — ABNORMAL HIGH (ref 70–99)
Potassium: 3.1 mmol/L — ABNORMAL LOW (ref 3.5–5.1)
Sodium: 143 mmol/L (ref 135–145)
Total Bilirubin: 0.4 mg/dL (ref 0.3–1.2)
Total Protein: 6.6 g/dL (ref 6.5–8.1)

## 2020-10-03 MED ORDER — OXYCODONE-ACETAMINOPHEN 5-325 MG PO TABS: 1.0000 | ORAL_TABLET | Freq: Four times a day (QID) | ORAL | 0 refills | Status: DC | PRN

## 2020-10-03 MED ORDER — POTASSIUM CHLORIDE CRYS ER 20 MEQ PO TBCR: 20.0000 meq | EXTENDED_RELEASE_TABLET | Freq: Every day | ORAL | 0 refills | Status: DC

## 2020-10-03 NOTE — Progress Notes (Signed)
Per Dr. Julien Nordmann, I notified pathology to send recent bx to molecular testing and PDL 1.

## 2020-10-03 NOTE — Progress Notes (Signed)
Dr. Julien Nordmann referred Ms. Lisa to be seen at [facility]. I contacted [place].  I was given a fax number and I fax referral.

## 2020-10-03 NOTE — Progress Notes (Signed)
[name] [phone]   [phone]  OFFICE PROGRESS NOTE  [name], MD 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 female returns to the clinic today for follow-up visit accompanied by her daughter.  The patient is feeling fine today except for the significant pain on the right side of the chest.  She is currently on pain medication with Percocet 5/325 every 6 hours as needed for pain.  She initially had some nausea with this treatment but she was given prescription for nausea medication and she is feeling better.  She denied having any current shortness of breath, cough or hemoptysis.  She denied having any fever or chills.  She has no current nausea, vomiting, diarrhea or constipation.  She had a PET scan performed recently in addition to CT-guided core biopsy of the right chest mass by interventional radiology.  The final pathology ([id number]) showed poorly differentiated spindle cell malignant neoplasm. Immunohistochemical stains show that the tumor cells are focally positive for CK AE1/AE3 while they are negative for calretinin, CD34, CD 117, see a 5/6, D2-40, p63 and TTF-1. 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 The tumor histomorphology and immunoprofile appear most consistent with recurrent/metastasis of this lesion.  She is here today for evaluation and discussion of her PET scan results as well as the pathology and treatment options.  MEDICAL  HISTORY: Past Medical History:  Diagnosis Date  . Arthritis   . B12 deficiency anemia   . Diabetes mellitus ORAL AND INSULIN MEDS  . Fibrosis of knee joint RIGHT  . Heart palpitations SINCE 2008 TAKES BETA BLOCKER  . 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 Hyperlipidemia   . Hypertension   . Neuropathy, diabetic ([place]) RIGHT FOOT MILD NUMBNESS  . PONV (postoperative nausea and vomiting)     ALLERGIES:  is allergic to contrast media [iodinated diagnostic agents], epinephrine, and sulfa antibiotics.  MEDICATIONS:  Current Outpatient Medications  Medication Sig Dispense Refill  . cetirizine (ZYRTEC) 10 MG tablet Take 10 mg by mouth daily.    Marland Kitchen estradiol (ESTRACE) 0.1 MG/GM vaginal cream Place 1 Applicatorful vaginally daily as needed (Irritation or dryness).    Marland Kitchen estradiol (ESTRACE) 0.5 MG tablet Take 0.5 mg by mouth daily.    . furosemide (LASIX) 40 MG tablet Take 20 mg by mouth daily.    Marland Kitchen gabapentin (NEURONTIN) 100 MG capsule Take 1 capsule (100 mg total) by mouth 3 (three) times daily. 90 capsule 1  . insulin glargine (LANTUS) 100 UNIT/ML injection Inject 55 Units into the skin every morning.    Marland Kitchen JANUMET 50-500 MG tablet Take 1 tablet by mouth 2 (two) times daily.    Marland Kitchen lisinopril (PRINIVIL,ZESTRIL) 5 MG tablet Take 2.5 mg by mouth daily.     . methocarbamol (ROBAXIN) 500 MG tablet Take 1 tablet (500 mg total) by mouth every 6 (six) hours as needed for muscle spasms.  40 tablet 1  . metoprolol succinate (TOPROL-XL) 50 MG 24 hr tablet Take 50 mg by mouth daily. Take with or immediately following a meal.    . oxyCODONE-acetaminophen (PERCOCET/ROXICET) 5-325 MG tablet Take 1 tablet by mouth every 8 (eight) hours as needed for severe pain. 40 tablet 0  . prochlorperazine (COMPAZINE) 10 MG tablet Take 1 tablet (10 mg total) by mouth every 6 (six) hours as needed for nausea or vomiting.  Take one tablet ( 10 mg) 30 minutes prior to pain med every 6 hours prn. 30 tablet 0  . rivaroxaban (XARELTO) 10 MG TABS tablet Take 1 tablet (10 mg total) by mouth daily with breakfast. 20 tablet 0  . rosuvastatin (CRESTOR) 40 MG tablet Take 10 mg by mouth daily.    Marland Kitchen tobramycin-dexamethasone (TOBRADEX) ophthalmic solution Place 1 drop into the left eye daily as needed. (redness)     No current facility-administered medications for this visit.    SURGICAL HISTORY:  Past Surgical History:  Procedure Laterality Date  . ABDOMINAL HYSTERECTOMY    . BALLOON DILATION N/A 06/12/2015   Procedure: BALLOON DILATION;  Surgeon: [name], MD;  Location: [facility];  Service: Endoscopy;  Laterality: N/A;  . BREAST LUMPECTOMY  07-27-2007   LEFT   . CARDIAC CATHETERIZATION  2001   MIMINAL PLAQUE RCA, CIRCUMFLEX, LAD;   NORMAL LVF  . CERVICAL FUSION  1996   C3 - 5  . ESOPHAGOGASTRODUODENOSCOPY (EGD) WITH PROPOFOL N/A 06/12/2015   Procedure: ESOPHAGOGASTRODUODENOSCOPY (EGD) WITH PROPOFOL;  Surgeon: [name], MD;  Location: [facility];  Service: Endoscopy;  Laterality: N/A;  . EXCHANGE LEFT BREAST IMPLANT/ CAPSULOTOMY/ RIGHT MASTOPEXY  11-28-2008  . JOINT REPLACEMENT    . KNEE ARTHROSCOPY  1999   RIGHT  . KNEE ARTHROTOMY  04-03-2011   RIGHT KNEE/ SCAR EXCISION/ POLYTHYLENE EXCHANGE  . KNEE CLOSED REDUCTION  07/31/2011   Procedure: CLOSED MANIPULATION KNEE;  Surgeon: [name], MD;  Location: [facility];  Service: Orthopedics;  Laterality: Right;  . LEFT BREAST BX ;  X2  1976  . MANIPULATION KNEE JOINT  04-02-2006   CLOSED --  RIGHT   . TOTAL ABDOMINAL HYSTERECTOMY W/ BILATERAL SALPINGOOPHORECTOMY  1989   AND APPENDECTOMY  . TOTAL KNEE ARTHROPLASTY  02-03-2006   RIGHT  . TOTAL KNEE ARTHROPLASTY Left 10/23/2015   Procedure: TOTAL LEFT KNEE ARTHROPLASTY;  Surgeon: [name], MD;  Location: [facility];  Service: Orthopedics;  Laterality: Left;  . TOTAL LEFT  MASTECTOMY  09-29-2007  . TUBAL LIGATION  1976  . URETHRAL DILATION  1976    REVIEW OF SYSTEMS:  Constitutional: positive for fatigue Eyes: negative Ears, nose, mouth, throat, and face: negative Respiratory: positive for pleurisy/chest pain Cardiovascular: negative Gastrointestinal: negative Genitourinary:negative Integument/breast: negative Hematologic/lymphatic: negative Musculoskeletal:negative Neurological: negative Behavioral/Psych: negative Endocrine: negative Allergic/Immunologic: negative   PHYSICAL EXAMINATION: General appearance: alert, cooperative, fatigued and no distress Head: Normocephalic, without obvious abnormality, atraumatic Neck: no adenopathy, no JVD, supple, symmetrical, trachea midline and thyroid not enlarged, symmetric, no tenderness/mass/nodules Lymph nodes: Cervical, supraclavicular, and axillary nodes normal. Resp: clear to auscultation bilaterally Back: symmetric, no curvature. ROM normal. No CVA tenderness. Cardio: normal apical impulse GI: soft, non-tender; bowel sounds normal; no masses,  no organomegaly Extremities: extremities normal, atraumatic, no cyanosis or edema Neurologic: Alert and oriented X 3, normal strength and tone. Normal symmetric reflexes. Normal coordination and gait  ECOG PERFORMANCE STATUS: 1 - Symptomatic but completely ambulatory  Blood pressure 133/71, pulse 93,  temperature 97.8 F (36.6 C), temperature source Tympanic, resp. rate 16, height 5\' 7"  (1.702 m), weight 162 lb 6.4 oz (73.7 kg), SpO2 96 %.  LABORATORY DATA: Lab Results  Component Value Date   WBC 8.7 10/03/2020   HGB 11.0 (L) 10/03/2020   HCT 34.6 (L) 10/03/2020   MCV 88.3 10/03/2020   PLT 355 10/03/2020      Chemistry      Component Value Date/Time   NA 140 09/18/2020 1406   NA 141 07/20/2012 0959   K 3.3 (L) 09/18/2020 1406   K 3.9 07/20/2012 0959   CL 104 09/18/2020 1406   CL 110 (H) 07/20/2012 0959   CO2 28 09/18/2020 1406   CO2 23 07/20/2012  0959   BUN 14 09/18/2020 1406   BUN 16.0 07/20/2012 0959   CREATININE 0.97 09/18/2020 1406   CREATININE 0.8 07/20/2012 0959      Component Value Date/Time   CALCIUM 9.6 09/18/2020 1406   CALCIUM 9.2 07/20/2012 0959   ALKPHOS 77 09/18/2020 1406   ALKPHOS 56 07/20/2012 0959   AST 11 (L) 09/18/2020 1406   AST 15 07/20/2012 0959   ALT 9 09/18/2020 1406   ALT 15 07/20/2012 0959   BILITOT 0.5 09/18/2020 1406   BILITOT 0.50 07/20/2012 0959       RADIOGRAPHIC STUDIES: CT ABDOMEN WO CONTRAST  Result Date: 09/08/2020 CLINICAL DATA:  Abnormal chest radiograph with RIGHT upper lobe mass, past history of LEFT breast cancer post mastectomy EXAM: CT CHEST AND ABDOMEN WITHOUT CONTRAST TECHNIQUE: Multidetector CT imaging of the chest and abdomen was performed following the standard protocol without intravenous contrast. Sagittal and coronal MPR images reconstructed from axial data set. Oral contrast was not administered. IV contrast not utilized due to history of contrast allergy. COMPARISON:  CT chest 07/14/2009, CT abdomen and pelvis 08/20/2007 FINDINGS: CT CHEST FINDINGS WITHOUT CONTRAST Cardiovascular: Atherosclerotic calcifications aorta, proximal great vessels and coronary arteries. Aorta normal caliber. Heart unremarkable. No pericardial effusion. Mediastinum/Nodes: Esophagus unremarkable. Tiny RIGHT thyroid nodule; Not clinically significant; no follow-up imaging recommended (ref: J Am Coll Radiol. 2015 Feb;12(2): 143-50). Single enlarged precarinal lymph node 15 mm short axis image 23, new. No additional thoracic adenopathy. Post LEFT mastectomy and placement of a LEFT breast prosthesis. Lungs/Pleura: Mass identified at posterior aspect of RIGHT upper lobe measuring 4.5 x 5.4 x 5.5 cm consistent with a pulmonary neoplasm. Mass abuts the pleura over a broad surface and demonstrates posterior chest wall invasion. Few calcifications within mass. Minimal surrounding infiltrate. Adjacent satellite nodule  12 x 10 mm image 45. Tiny subpleural nodule RIGHT upper lobe image 53. Small focus of infiltrate or scarring at minor fissure unchanged. Tiny lung nodule posterior RIGHT lower lobe base image 114 unchanged. Remaining lungs clear. Musculoskeletal: Bone island lower thoracic spine stable. Cortical thinning and bone erosion at the posterior RIGHT fifth rib by the RIGHT upper lobe mass. Mass also abuts the fourth rib without definite bone destruction. CT ABDOMEN FINDINGS WITHOUT CONTRAST Hepatobiliary: Gallbladder and liver unremarkable Pancreas: Atrophic without mass Spleen: Normal appearance Adrenals/Urinary Tract: Normal appearing RIGHT adrenal gland. LEFT adrenal mass 18 x 21 mm, with question an additional 15 x 12 mm nodule more inferiorly. Small peripelvic cysts at both kidneys. No additional renal mass or hydronephrosis. No urinary tract calcification. Stomach/Bowel: Stomach and visualized bowel loops unremarkable Vascular/Lymphatic: Atherosclerotic calcification aorta. Visualized aorta normal caliber. No upper abdominal adenopathy. Other: No free air or free fluid in upper abdomen. No umbilical hernia. Musculoskeletal: Unremarkable IMPRESSION: 4.5  x 5.4 x 5.5 cm diameter RIGHT upper lobe mass consistent with a pulmonary neoplasm. Mass abuts the pleura over a broad surface and demonstrates posterior chest wall invasion and involvement of the posterior RIGHT fifth rib. Adjacent satellite nodule 12 x 10 mm with tiny nonspecific subpleural nodule RIGHT upper lobe. Single enlarged precarinal lymph node 15 mm short axis, new. LEFT adrenal mass 18 x 21 mm with question additional 15 x 12 mm nodule more inferiorly, question metastatic disease. Peripelvic renal cysts. Aortic Atherosclerosis (ICD10-I70.0). Electronically Signed   By: Lavonia Dana M.D.   On: 09/08/2020 15:28   DG Chest 2 View  Result Date: 09/04/2020 CLINICAL DATA:  Pleuritic chest pain EXAM: CHEST - 2 VIEW COMPARISON:  06/05/2019 FINDINGS: Heart is  normal size. Lingular atelectasis or scarring. Large mass noted medially in the right upper lobe measuring approximately 5 cm. This is new since prior study. No effusions or acute bony abnormality. IMPRESSION: Approximately 5 cm medial right upper lobe mass. Recommend chest CT for further evaluation. Lingular scarring or atelectasis. Electronically Signed   By: Rolm Baptise M.D.   On: 09/04/2020 16:39   CT CHEST WO CONTRAST  Result Date: 09/08/2020 CLINICAL DATA:  Abnormal chest radiograph with RIGHT upper lobe mass, past history of LEFT breast cancer post mastectomy EXAM: CT CHEST AND ABDOMEN WITHOUT CONTRAST TECHNIQUE: Multidetector CT imaging of the chest and abdomen was performed following the standard protocol without intravenous contrast. Sagittal and coronal MPR images reconstructed from axial data set. Oral contrast was not administered. IV contrast not utilized due to history of contrast allergy. COMPARISON:  CT chest 07/14/2009, CT abdomen and pelvis 08/20/2007 FINDINGS: CT CHEST FINDINGS WITHOUT CONTRAST Cardiovascular: Atherosclerotic calcifications aorta, proximal great vessels and coronary arteries. Aorta normal caliber. Heart unremarkable. No pericardial effusion. Mediastinum/Nodes: Esophagus unremarkable. Tiny RIGHT thyroid nodule; Not clinically significant; no follow-up imaging recommended (ref: J Am Coll Radiol. 2015 Feb;12(2): 143-50). Single enlarged precarinal lymph node 15 mm short axis image 23, new. No additional thoracic adenopathy. Post LEFT mastectomy and placement of a LEFT breast prosthesis. Lungs/Pleura: Mass identified at posterior aspect of RIGHT upper lobe measuring 4.5 x 5.4 x 5.5 cm consistent with a pulmonary neoplasm. Mass abuts the pleura over a broad surface and demonstrates posterior chest wall invasion. Few calcifications within mass. Minimal surrounding infiltrate. Adjacent satellite nodule 12 x 10 mm image 45. Tiny subpleural nodule RIGHT upper lobe image 53. Small  focus of infiltrate or scarring at minor fissure unchanged. Tiny lung nodule posterior RIGHT lower lobe base image 114 unchanged. Remaining lungs clear. Musculoskeletal: Bone island lower thoracic spine stable. Cortical thinning and bone erosion at the posterior RIGHT fifth rib by the RIGHT upper lobe mass. Mass also abuts the fourth rib without definite bone destruction. CT ABDOMEN FINDINGS WITHOUT CONTRAST Hepatobiliary: Gallbladder and liver unremarkable Pancreas: Atrophic without mass Spleen: Normal appearance Adrenals/Urinary Tract: Normal appearing RIGHT adrenal gland. LEFT adrenal mass 18 x 21 mm, with question an additional 15 x 12 mm nodule more inferiorly. Small peripelvic cysts at both kidneys. No additional renal mass or hydronephrosis. No urinary tract calcification. Stomach/Bowel: Stomach and visualized bowel loops unremarkable Vascular/Lymphatic: Atherosclerotic calcification aorta. Visualized aorta normal caliber. No upper abdominal adenopathy. Other: No free air or free fluid in upper abdomen. No umbilical hernia. Musculoskeletal: Unremarkable IMPRESSION: 4.5 x 5.4 x 5.5 cm diameter RIGHT upper lobe mass consistent with a pulmonary neoplasm. Mass abuts the pleura over a broad surface and demonstrates posterior chest wall invasion and involvement of  the posterior RIGHT fifth rib. Adjacent satellite nodule 12 x 10 mm with tiny nonspecific subpleural nodule RIGHT upper lobe. Single enlarged precarinal lymph node 15 mm short axis, new. LEFT adrenal mass 18 x 21 mm with question additional 15 x 12 mm nodule more inferiorly, question metastatic disease. Peripelvic renal cysts. Aortic Atherosclerosis (ICD10-I70.0). Electronically Signed   By: Lavonia Dana M.D.   On: 09/08/2020 15:28   NM PET Image Initial (PI) Skull Base To Thigh  Result Date: 10/02/2020 CLINICAL DATA:  Initial treatment strategy for right upper lobe mass. EXAM: NUCLEAR MEDICINE PET SKULL BASE TO THIGH TECHNIQUE: 7.8 mCi F-18 FDG was  injected intravenously. Full-ring PET imaging was performed from the skull base to thigh after the radiotracer. CT data was obtained and used for attenuation correction and anatomic localization. Fasting blood glucose: 99 mg/dl COMPARISON:  CT chest abdomen pelvis 09/08/2020. FINDINGS: Mediastinal blood pool activity: SUV max 2.8 Liver activity: SUV max NA NECK: No abnormal hypermetabolism. Incidental CT findings: None. CHEST: Right supraclavicular lymph node measures 7 mm (4/56) with an SUV max of 4.6. Right hilar hypermetabolism has an SUV max of 5.0. Correlation with CT is challenging without IV contrast. Partially calcified mass straddling the right major fissure measures 4.1 x 5.5 cm with an SUV max of 23.1. There is a small hypermetabolic nodule along the anterior margin, measuring 1.0 x 1.6 cm (8/21). 12 mm prepericardiac lymph node (4/95) with an SUV max 13.7. Incidental CT findings: Atherosclerotic calcification of the aorta, aortic valve and coronary arteries. Heart is at the upper limits of normal in size to mildly enlarged. Low right paratracheal lymph node measures 12 mm and does not show abnormal hypermetabolism. Tiny right pleural effusion. Image quality is rather degraded by respiratory motion. ABDOMEN/PELVIS: Hypermetabolic left adrenal mass measures 2.4 x 4.4 cm with an SUV max of 16.2. There is marked wall thickening involving the descending duodenum (4/119) with associated abnormal FDG uptake, SUV max 16.2. Periportal lymph nodes measure up to 12 mm (4/111) with an SUV max of 7.7. Additional focal areas of hypermetabolism in the proximal stomach and proximal jejunum. No abnormal hypermetabolism in the liver, right adrenal gland, spleen or pancreas. Incidental CT findings: Liver, gallbladder and right adrenal gland are unremarkable. Probable renal sinus cysts bilaterally. Scarring in the right kidney. Left kidney appears somewhat atrophic. Spleen, pancreas, stomach and bowel are otherwise grossly  unremarkable. Atherosclerotic calcification of the aorta. Bilateral inguinal hernias contain fat. SKELETON: No abnormal hypermetabolism. Incidental CT findings: Degenerative changes in the spine. No worrisome lytic or sclerotic lesions. No osseous erosion involving posterior right ribs which are adjacent to the aforementioned mass. IMPRESSION: 1. 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 2. Hypermetabolic duodenal wall thickening with hypermetabolic periportal adenopathy and additional focal areas of hypermetabolism in the proximal stomach and proximal jejunum, findings which may represent additional metastatic disease. A second primary gastrointestinal tumor cannot be excluded. 3. Tiny right pleural effusion. 4. Aortic atherosclerosis (ICD10-I70.0). Coronary artery calcification. Electronically Signed   By: Lorin Picket M.D.   On: 10/02/2020 09:22   DG Chest Port 1 View  Result Date: 09/25/2020 CLINICAL DATA:  Status post CT-guided biopsy. EXAM: PORTABLE CHEST 1 VIEW COMPARISON:  Images from the CT scan and prior chest x-rays. The most recent is 09/04/2020 FINDINGS: Stable appearing right medial lung mass. No postprocedural pneumothorax or hematoma is identified. IMPRESSION: No postprocedural pneumothorax or hematoma.  Electronically Signed   By: Marijo Sanes M.D.   On: 09/25/2020 13:39   CT LUNG MASS BIOPSY  Result Date: 09/25/2020 INDICATION: 80 year old with right lung mass and needs a tissue diagnosis. EXAM: CT-GUIDED CORE BIOPSY OF RIGHT LUNG MASS MEDICATIONS: Moderate sedation ANESTHESIA/SEDATION: Moderate (conscious) sedation was employed during this procedure. A total of Versed 2.0 mg and Fentanyl 100 mcg was administered intravenously. Moderate Sedation Time: 20 minutes. The patient's level of consciousness and vital signs were monitored  continuously by radiology nursing throughout the procedure under my direct supervision. FLUOROSCOPY TIME:  None COMPLICATIONS: None immediate. PROCEDURE: Informed written consent was obtained from the patient after a thorough discussion of the procedural risks, benefits and alternatives. All questions were addressed. A timeout was performed prior to the initiation of the procedure. Patient was placed prone. CT images through the chest were obtained. Right posterior lung mass was identified and targeted. The right side of the back was prepped with chlorhexidine and sterile field was created. Skin and soft tissues were anesthetized using 1% lidocaine. Using CT guidance, a 17 gauge coaxial needle was directed into the right lung mass. Three core biopsies were obtained with an 18 gauge core device. Specimens placed in formalin. 17 gauge coaxial needle was removed without complication. Bandage placed over the puncture site. FINDINGS: Large pleural-based mass in the posterior right upper lobe. Mass appears to be extending into the posterior chest wall. Coaxial needle was directed into the mass lesion. Three core samples were obtained. No evidence for pneumothorax on the final images. IMPRESSION: CT-guided core biopsy of the right lung mass. Electronically Signed   By: Markus Daft M.D.   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  I personally and independently reviewed the PET scan images and discussed the results and showed the images to the patient and her daughter. Unfortunately her PET scan showed hypermetabolic right lung  mass with hypermetabolic right suprahilar/right hilar/pericardiac lymph node and left adrenal mass.  There was also hypermetabolic duodenal wall thickening with hypermetabolic periportal adenopathy and additional focal areas of hypermetabolism in the proximal stomach and proximal jejunum.  These findings are suggestive additional areas of metastatic disease or second malignancy in that area. I had a lengthy discussion with the patient and her daughter today about her current condition and treatment options. I will refer the patient to radiation oncology for palliative radiotherapy to the painful right lung mass. For pain management I gave her a refill of Percocet. I also discussed with the patient her other treatment options including palliative care and hospice referral versus consideration of systemic chemotherapy with doxorubicin-based regimen versus docetaxel and gemcitabine as an alternative. I will also refer the patient to Dr. Dawna Part at [facility] who is a specialist in the field of sarcoma for a second opinion and recommendation regarding treatment of her condition. We also requested her tissue [facility] for molecular studies but the results are still pending. I will see the patient back for follow-up visit in around 2 weeks for evaluation and more detailed discussion of her treatment options based on the molecular studies and also  the recommendation from Dr. Dawna Part. She was advised to call immediately if she has any concerning symptoms in the interval.  The patient voices understanding of current disease status and treatment options and is in agreement with the current care plan.  All questions were answered. The patient knows to call the clinic with any problems, questions or concerns. We can certainly see the patient much sooner if necessary. The total time spent in the appointment was 55 minutes.  Disclaimer: This note was dictated with voice  recognition software. Similar sounding words can inadvertently be transcribed and may not be corrected upon review.

## 2020-10-05 ENCOUNTER — Telehealth: Payer: Self-pay | Admitting: Internal Medicine

## 2020-10-05 ENCOUNTER — Ambulatory Visit
Admission: RE | Admit: 2020-10-05 | Discharge: 2020-10-05 | Disposition: A | Discharge status: Home / Self Care | Payer: Medicare Other | Source: Ambulatory Visit | Attending: Radiation Oncology | Admitting: Radiation Oncology

## 2020-10-05 ENCOUNTER — Encounter: Payer: Self-pay | Admitting: Radiation Oncology

## 2020-10-05 ENCOUNTER — Other Ambulatory Visit: Payer: Self-pay

## 2020-10-05 DIAGNOSIS — Z9012 Acquired absence of left breast and nipple: Secondary | ICD-10-CM | POA: Insufficient documentation

## 2020-10-05 DIAGNOSIS — Z853 Personal history of malignant neoplasm of breast: Secondary | ICD-10-CM | POA: Insufficient documentation

## 2020-10-05 DIAGNOSIS — N281 Cyst of kidney, acquired: Secondary | ICD-10-CM | POA: Insufficient documentation

## 2020-10-05 DIAGNOSIS — E1140 Type 2 diabetes mellitus with diabetic neuropathy, unspecified: Secondary | ICD-10-CM | POA: Insufficient documentation

## 2020-10-05 DIAGNOSIS — C3411 Malignant neoplasm of upper lobe, right bronchus or lung: Secondary | ICD-10-CM | POA: Insufficient documentation

## 2020-10-05 DIAGNOSIS — I10 Essential (primary) hypertension: Secondary | ICD-10-CM | POA: Insufficient documentation

## 2020-10-05 DIAGNOSIS — J90 Pleural effusion, not elsewhere classified: Secondary | ICD-10-CM | POA: Diagnosis not present

## 2020-10-05 DIAGNOSIS — E785 Hyperlipidemia, unspecified: Secondary | ICD-10-CM | POA: Insufficient documentation

## 2020-10-05 DIAGNOSIS — Z79899 Other long term (current) drug therapy: Secondary | ICD-10-CM | POA: Diagnosis not present

## 2020-10-05 DIAGNOSIS — Z800 Family history of malignant neoplasm of digestive organs: Secondary | ICD-10-CM | POA: Insufficient documentation

## 2020-10-05 DIAGNOSIS — M129 Arthropathy, unspecified: Secondary | ICD-10-CM | POA: Diagnosis not present

## 2020-10-05 DIAGNOSIS — I700 Atherosclerosis of aorta: Secondary | ICD-10-CM | POA: Diagnosis not present

## 2020-10-05 DIAGNOSIS — K4020 Bilateral inguinal hernia, without obstruction or gangrene, not specified as recurrent: Secondary | ICD-10-CM | POA: Diagnosis not present

## 2020-10-05 DIAGNOSIS — Z794 Long term (current) use of insulin: Secondary | ICD-10-CM | POA: Insufficient documentation

## 2020-10-05 DIAGNOSIS — C50912 Malignant neoplasm of unspecified site of left female breast: Secondary | ICD-10-CM

## 2020-10-05 DIAGNOSIS — C50911 Malignant neoplasm of unspecified site of right female breast: Secondary | ICD-10-CM

## 2020-10-05 DIAGNOSIS — E279 Disorder of adrenal gland, unspecified: Secondary | ICD-10-CM | POA: Diagnosis not present

## 2020-10-05 MED ORDER — HYDROMORPHONE HCL 4 MG PO TABS: 4.0000 mg | ORAL_TABLET | ORAL | 0 refills | Status: AC | PRN

## 2020-10-05 NOTE — Progress Notes (Signed)
See md note for nursing evaluation 

## 2020-10-05 NOTE — Telephone Encounter (Signed)
Scheduled per los. Called and spoke with patient. Confirmed appt 

## 2020-10-05 NOTE — Progress Notes (Signed)
Radiation Oncology         [phone] ________________________________  Initial Outpatient Consultation  Name: [name] MRN: [id number]  Date: 10/05/2020  DOB: [birth year]  NL:[id number], Herbie [place], MD  Curt Bears, MD   REFERRING PHYSICIAN: Curt Bears, MD  DIAGNOSIS: The encounter diagnosis was Sarcoma of left breast [facility]).  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 female who is seen as a courtesy of Dr. Julien Nordmann for an opinion concerning radiation therapy as part of management for her recently diagnosed lung cancer. Today, she is accompanied by her son on evaluation today. 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 Status post left mastectomy.  In more recent history, the patient underwent a chest x-ray on 09/04/2020 for evaluation of pleuritic chest pain. Results showed an approximately 5 cm medial right upper lobe mass.   CT scan of chest and abdomen on 09/08/2020 showed a 4.5 x 5.4 x 5.5 cm right upper lobe mass that was consistent with pulmonary neoplasm. The mass abutted the pleura over a broad surface and demonstrated posterior chest wall invasion and involvement of the posterior right fifth rib. There was an adjacent satellite nodule that measured 12 x 10 mm with tiny non-specific subpleural nodule in the right upper lobe. Additionally, there was a single enlarged pre-carinal lymph node that was new with 15 mm short axis. Finally, there was a left adrenal mass that measured 18 x 21 mm with question of an additional 15 x 12 mm nodule inferiorly, questioning metastatic disease.   CT biopsy of the right upper lobe mass on 09/25/2020 revealed poorly differentiated spindle cell malignant neoplasm. The tumor histomorphology and immunoprofile appeared most consistent with  recurrent/metastasis of the left breast lesion.  PET scan on 10/02/2020 showed a hypermetabolic right lung mass with hypermetabolic right supraclavicular/right hilar/pre-pericardiac lymph nodes and left adrenal mass, indicative of stage IV primary bronchogenic carcinoma. Additionally, there was hypermetabolic duodenal wall thickening with hypermetabolic periportal adenopathy and additional focal areas of hypermetabolism in the proximal stomach and proximal jejunum, possibly representing additional metastatic diease. A second primary gastrointestinal tumor could not be excluded.   The patient was seen by Dr. Julien Nordmann on 10/03/2020, at which time they discussed treatment options including palliative care and hospice referral vs consideration of systemic chemotherapy with Doxorubicin-based regimen vs Docetaxel and Gemcitabine as an alternative. She was also referred to Dr. Dawna Part at [facility], who specializes in sarcoma, for a second opinion and recommendation regarding treatment for her condition.  PREVIOUS RADIATION THERAPY: No  PAST MEDICAL HISTORY:  Past Medical History:  Diagnosis Date  . Arthritis   . B12 deficiency anemia   . Diabetes mellitus ORAL AND INSULIN MEDS  . Fibrosis of knee joint RIGHT  . Heart palpitations SINCE 2008 TAKES BETA BLOCKER  . 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 Hyperlipidemia   . Hypertension   . Neuropathy, diabetic ([place]) RIGHT FOOT MILD NUMBNESS  . PONV (postoperative nausea and vomiting)     PAST SURGICAL HISTORY: Past Surgical History:  Procedure Laterality Date  . ABDOMINAL HYSTERECTOMY    . BALLOON DILATION N/A 06/12/2015   Procedure: BALLOON DILATION;  Surgeon: [name], MD;  Location: [facility];  Service: Endoscopy;  Laterality: N/A;  . BREAST LUMPECTOMY  07-27-2007   LEFT   . CARDIAC CATHETERIZATION  2001    MIMINAL PLAQUE RCA, CIRCUMFLEX, LAD;   NORMAL LVF  . CERVICAL FUSION  1996   C3 - 5  . ESOPHAGOGASTRODUODENOSCOPY (EGD) WITH PROPOFOL N/A 06/12/2015   Procedure: ESOPHAGOGASTRODUODENOSCOPY (EGD) WITH PROPOFOL;  Surgeon: [name], MD;  Location: [facility];  Service: Endoscopy;  Laterality: N/A;  . EXCHANGE LEFT BREAST IMPLANT/ CAPSULOTOMY/ RIGHT MASTOPEXY  11-28-2008  . JOINT REPLACEMENT    . KNEE ARTHROSCOPY  1999   RIGHT  . KNEE ARTHROTOMY  04-03-2011   RIGHT KNEE/ SCAR EXCISION/ POLYTHYLENE EXCHANGE  . KNEE CLOSED REDUCTION  07/31/2011   Procedure: CLOSED MANIPULATION KNEE;  Surgeon: [name], MD;  Location: [place];  Service: Orthopedics;  Laterality: Right;  . LEFT BREAST BX ;  X2  1976  . MANIPULATION KNEE JOINT  04-02-2006   CLOSED --  RIGHT   . TOTAL ABDOMINAL HYSTERECTOMY W/ BILATERAL SALPINGOOPHORECTOMY  1989   AND APPENDECTOMY  . TOTAL KNEE ARTHROPLASTY  02-03-2006   RIGHT  . TOTAL KNEE ARTHROPLASTY Left 10/23/2015   Procedure: TOTAL LEFT KNEE ARTHROPLASTY;  Surgeon: [name], MD;  Location: [facility];  Service: Orthopedics;  Laterality: Left;  . TOTAL LEFT MASTECTOMY  09-29-2007  . TUBAL LIGATION  1976  . URETHRAL DILATION  1976    FAMILY HISTORY:  Family History  Problem Relation Age of Onset  . Heart disease Mother   . Stroke Father   . Cancer Sister        stomach  . Cancer Brother        colon  . Breast cancer Neg Hx     SOCIAL HISTORY:  Social History   Tobacco Use  . Smoking status: Never Smoker  . Smokeless tobacco: Never Used  Vaping Use  . Vaping Use: Never used  Substance Use Topics  . Alcohol use: No  . Drug use: No    ALLERGIES:  Allergies  Allergen Reactions  . Abaloparatide Other (See Comments)  . Contrast Media [Iodinated Diagnostic Agents] Hives and Nausea And Vomiting    Patient reports that reaction was hives at time on contrast administration  . Empagliflozin Other (See Comments)  .  Epinephrine Other (See Comments)    DIZZY AND ALMOST SYNCOPE  . Exenatide Other (See Comments)  . Liraglutide Other (See Comments)  . Parathyroid Hormone (Recomb) Other (See Comments)  . Sulfa Antibiotics Hives    MEDICATIONS:  Current Outpatient Medications  Medication Sig Dispense Refill  . cetirizine (ZYRTEC) 10 MG tablet Take 10 mg by mouth daily.    . Cholecalciferol 50 MCG (2000 UT) CAPS Take 1 capsule by mouth daily.    . furosemide (LASIX) 40 MG tablet Take 20 mg by mouth daily.    Marland Kitchen gabapentin (NEURONTIN) 100 MG capsule Take 1 capsule (100 mg total) by mouth 3 (three) times daily. 90 capsule 1  . HYDROmorphone (DILAUDID) 4 MG tablet Take 1 tablet (4 mg total) by mouth every 4 (four) hours as needed for severe pain. 45 tablet 0  . insulin glargine (LANTUS) 100 UNIT/ML injection Inject 55 Units into the skin every morning.    Marland Kitchen JANUMET 50-500 MG tablet Take 1 tablet by mouth 2 (two) times daily.    Marland Kitchen lisinopril (PRINIVIL,ZESTRIL) 5 MG tablet Take 2.5 mg by mouth daily.     Marland Kitchen LYLLANA 0.025 MG/24HR Place  1 patch onto the skin 2 (two) times a week.    . metoprolol succinate (TOPROL-XL) 50 MG 24 hr tablet Take 50 mg by mouth daily. Take with or immediately following a meal.    . oxyCODONE-acetaminophen (PERCOCET/ROXICET) 5-325 MG tablet Take 1 tablet by mouth every 6 (six) hours as needed for severe pain. 40 tablet 0  . potassium chloride SA (KLOR-CON) 20 MEQ tablet Take 1 tablet (20 mEq total) by mouth daily. 10 tablet 0  . rosuvastatin (CRESTOR) 40 MG tablet Take 10 mg by mouth daily.    . methocarbamol (ROBAXIN) 500 MG tablet Take 1 tablet (500 mg total) by mouth every 6 (six) hours as needed for muscle spasms. (Patient not taking: Reported on 10/05/2020) 40 tablet 1  . prochlorperazine (COMPAZINE) 10 MG tablet Take 1 tablet (10 mg total) by mouth every 6 (six) hours as needed for nausea or vomiting. Take one tablet ( 10 mg) 30 minutes prior to pain med every 6 hours prn. (Patient not  taking: Reported on 10/05/2020) 30 tablet 0   No current facility-administered medications for this encounter.    REVIEW OF SYSTEMS:  A 10+ POINT REVIEW OF SYSTEMS WAS OBTAINED including neurology, dermatology, psychiatry, cardiac, respiratory, lymph, extremities, GI, GU, musculoskeletal, constitutional, reproductive, HEENT.  Patient complains of significant pain in the right upper chest wall area.  Percocet is not adequate in controlling this pain whatsoever.  She denies any breathing difficulties significant cough or hemoptysis.  Patient denies any other areas of pain.  She denies any headaches or visual problems   PHYSICAL EXAM:  height is 5\' 7"  (1.702 m) and weight is 161 lb 6.4 oz (73.2 kg). Her temperature is 97.7 F (36.5 C). Her blood pressure is 121/64 and her pulse is 93. Her respiration is 18 and oxygen saturation is 98%.   General: Alert and oriented, in no acute distress, in obvious pain and is very uncomfortable with different body positions. HEENT: Head is normocephalic. Extraocular movements are intact.  Neck: Neck is supple, no palpable cervical or supraclavicular lymphadenopathy. Heart: Regular in rate and rhythm with no murmurs, rubs, or gallops. Chest: Clear to auscultation bilaterally, with no rhonchi, wheezes, or rales.  Palpable swelling in the right upper back region, firm, consistent with tumor infiltration of chest wall and rib cage, tender with palpation Abdomen: Soft, nontender, nondistended, with no rigidity or guarding. Extremities: No cyanosis or edema. Lymphatics: see Neck Exam Skin: No concerning lesions. Musculoskeletal: symmetric strength and muscle tone throughout. Neurologic: Cranial nerves II through XII are grossly intact. No obvious focalities. Speech is fluent. Coordination is intact. Psychiatric: Judgment and insight are intact. Affect is appropriate. Reconstructed left breast without palpable or visible signs of recurrence.  Right breast shows no  palpable mass nipple discharge or bleeding  ECOG = 1  0 - Asymptomatic (Fully active, able to carry on all predisease activities without restriction)  1 - Symptomatic but completely ambulatory (Restricted in physically strenuous activity but ambulatory and able to carry out work of a light or sedentary nature. For example, light housework, office work)  2 - Symptomatic, <50% in bed during the day (Ambulatory and capable of all self care but unable to carry out any work activities. Up and about more than 50% of waking hours)  3 - Symptomatic, >50% in bed, but not bedbound (Capable of only limited self-care, confined to bed or chair 50% or more of waking hours)  4 - Bedbound (Completely disabled. Cannot carry on any self-care.  Totally confined to bed or chair)  5 - Death   Eustace Pen MM, Creech RH, Tormey DC, et al. [phone]). "Toxicity and response criteria of the [facility]". [place] Oncol. 5 (6): 649-55  LABORATORY DATA:  Lab Results  Component Value Date   WBC 8.7 10/03/2020   HGB 11.0 (L) 10/03/2020   HCT 34.6 (L) 10/03/2020   MCV 88.3 10/03/2020   PLT 355 10/03/2020   NEUTROABS 6.2 10/03/2020   Lab Results  Component Value Date   NA 143 10/03/2020   K 3.1 (L) 10/03/2020   CL 99 10/03/2020   CO2 30 10/03/2020   GLUCOSE 115 (H) 10/03/2020   CREATININE 0.76 10/03/2020   CALCIUM 8.9 10/03/2020      RADIOGRAPHY: CT ABDOMEN WO CONTRAST  Result Date: 09/08/2020 CLINICAL DATA:  Abnormal chest radiograph with RIGHT upper lobe mass, past history of LEFT breast cancer post mastectomy EXAM: CT CHEST AND ABDOMEN WITHOUT CONTRAST TECHNIQUE: Multidetector CT imaging of the chest and abdomen was performed following the standard protocol without intravenous contrast. Sagittal and coronal MPR images reconstructed from axial data set. Oral contrast was not administered. IV contrast not utilized due to history of contrast allergy. COMPARISON:  CT chest 07/14/2009, CT  abdomen and pelvis 08/20/2007 FINDINGS: CT CHEST FINDINGS WITHOUT CONTRAST Cardiovascular: Atherosclerotic calcifications aorta, proximal great vessels and coronary arteries. Aorta normal caliber. Heart unremarkable. No pericardial effusion. Mediastinum/Nodes: Esophagus unremarkable. Tiny RIGHT thyroid nodule; Not clinically significant; no follow-up imaging recommended (ref: J Am Coll Radiol. 2015 Feb;12(2): 143-50). Single enlarged precarinal lymph node 15 mm short axis image 23, new. No additional thoracic adenopathy. Post LEFT mastectomy and placement of a LEFT breast prosthesis. Lungs/Pleura: Mass identified at posterior aspect of RIGHT upper lobe measuring 4.5 x 5.4 x 5.5 cm consistent with a pulmonary neoplasm. Mass abuts the pleura over a broad surface and demonstrates posterior chest wall invasion. Few calcifications within mass. Minimal surrounding infiltrate. Adjacent satellite nodule 12 x 10 mm image 45. Tiny subpleural nodule RIGHT upper lobe image 53. Small focus of infiltrate or scarring at minor fissure unchanged. Tiny lung nodule posterior RIGHT lower lobe base image 114 unchanged. Remaining lungs clear. Musculoskeletal: Bone island lower thoracic spine stable. Cortical thinning and bone erosion at the posterior RIGHT fifth rib by the RIGHT upper lobe mass. Mass also abuts the fourth rib without definite bone destruction. CT ABDOMEN FINDINGS WITHOUT CONTRAST Hepatobiliary: Gallbladder and liver unremarkable Pancreas: Atrophic without mass Spleen: Normal appearance Adrenals/Urinary Tract: Normal appearing RIGHT adrenal gland. LEFT adrenal mass 18 x 21 mm, with question an additional 15 x 12 mm nodule more inferiorly. Small peripelvic cysts at both kidneys. No additional renal mass or hydronephrosis. No urinary tract calcification. Stomach/Bowel: Stomach and visualized bowel loops unremarkable Vascular/Lymphatic: Atherosclerotic calcification aorta. Visualized aorta normal caliber. No upper abdominal  adenopathy. Other: No free air or free fluid in upper abdomen. No umbilical hernia. Musculoskeletal: Unremarkable IMPRESSION: 4.5 x 5.4 x 5.5 cm diameter RIGHT upper lobe mass consistent with a pulmonary neoplasm. Mass abuts the pleura over a broad surface and demonstrates posterior chest wall invasion and involvement of the posterior RIGHT fifth rib. Adjacent satellite nodule 12 x 10 mm with tiny nonspecific subpleural nodule RIGHT upper lobe. Single enlarged precarinal lymph node 15 mm short axis, new. LEFT adrenal mass 18 x 21 mm with question additional 15 x 12 mm nodule more inferiorly, question metastatic disease. Peripelvic renal cysts. Aortic Atherosclerosis (ICD10-I70.0). Electronically Signed   By: Lavonia Dana  M.D.   On: 09/08/2020 15:28   CT CHEST WO CONTRAST  Result Date: 09/08/2020 CLINICAL DATA:  Abnormal chest radiograph with RIGHT upper lobe mass, past history of LEFT breast cancer post mastectomy EXAM: CT CHEST AND ABDOMEN WITHOUT CONTRAST TECHNIQUE: Multidetector CT imaging of the chest and abdomen was performed following the standard protocol without intravenous contrast. Sagittal and coronal MPR images reconstructed from axial data set. Oral contrast was not administered. IV contrast not utilized due to history of contrast allergy. COMPARISON:  CT chest 07/14/2009, CT abdomen and pelvis 08/20/2007 FINDINGS: CT CHEST FINDINGS WITHOUT CONTRAST Cardiovascular: Atherosclerotic calcifications aorta, proximal great vessels and coronary arteries. Aorta normal caliber. Heart unremarkable. No pericardial effusion. Mediastinum/Nodes: Esophagus unremarkable. Tiny RIGHT thyroid nodule; Not clinically significant; no follow-up imaging recommended (ref: J Am Coll Radiol. 2015 Feb;12(2): 143-50). Single enlarged precarinal lymph node 15 mm short axis image 23, new. No additional thoracic adenopathy. Post LEFT mastectomy and placement of a LEFT breast prosthesis. Lungs/Pleura: Mass identified at posterior  aspect of RIGHT upper lobe measuring 4.5 x 5.4 x 5.5 cm consistent with a pulmonary neoplasm. Mass abuts the pleura over a broad surface and demonstrates posterior chest wall invasion. Few calcifications within mass. Minimal surrounding infiltrate. Adjacent satellite nodule 12 x 10 mm image 45. Tiny subpleural nodule RIGHT upper lobe image 53. Small focus of infiltrate or scarring at minor fissure unchanged. Tiny lung nodule posterior RIGHT lower lobe base image 114 unchanged. Remaining lungs clear. Musculoskeletal: Bone island lower thoracic spine stable. Cortical thinning and bone erosion at the posterior RIGHT fifth rib by the RIGHT upper lobe mass. Mass also abuts the fourth rib without definite bone destruction. CT ABDOMEN FINDINGS WITHOUT CONTRAST Hepatobiliary: Gallbladder and liver unremarkable Pancreas: Atrophic without mass Spleen: Normal appearance Adrenals/Urinary Tract: Normal appearing RIGHT adrenal gland. LEFT adrenal mass 18 x 21 mm, with question an additional 15 x 12 mm nodule more inferiorly. Small peripelvic cysts at both kidneys. No additional renal mass or hydronephrosis. No urinary tract calcification. Stomach/Bowel: Stomach and visualized bowel loops unremarkable Vascular/Lymphatic: Atherosclerotic calcification aorta. Visualized aorta normal caliber. No upper abdominal adenopathy. Other: No free air or free fluid in upper abdomen. No umbilical hernia. Musculoskeletal: Unremarkable IMPRESSION: 4.5 x 5.4 x 5.5 cm diameter RIGHT upper lobe mass consistent with a pulmonary neoplasm. Mass abuts the pleura over a broad surface and demonstrates posterior chest wall invasion and involvement of the posterior RIGHT fifth rib. Adjacent satellite nodule 12 x 10 mm with tiny nonspecific subpleural nodule RIGHT upper lobe. Single enlarged precarinal lymph node 15 mm short axis, new. LEFT adrenal mass 18 x 21 mm with question additional 15 x 12 mm nodule more inferiorly, question metastatic disease.  Peripelvic renal cysts. Aortic Atherosclerosis (ICD10-I70.0). Electronically Signed   By: Lavonia Dana M.D.   On: 09/08/2020 15:28   NM PET Image Initial (PI) Skull Base To Thigh  Result Date: 10/02/2020 CLINICAL DATA:  Initial treatment strategy for right upper lobe mass. EXAM: NUCLEAR MEDICINE PET SKULL BASE TO THIGH TECHNIQUE: 7.8 mCi F-18 FDG was injected intravenously. Full-ring PET imaging was performed from the skull base to thigh after the radiotracer. CT data was obtained and used for attenuation correction and anatomic localization. Fasting blood glucose: 99 mg/dl COMPARISON:  CT chest abdomen pelvis 09/08/2020. FINDINGS: Mediastinal blood pool activity: SUV max 2.8 Liver activity: SUV max NA NECK: No abnormal hypermetabolism. Incidental CT findings: None. CHEST: Right supraclavicular lymph node measures 7 mm (4/56) with an SUV max of 4.6. Right  hilar hypermetabolism has an SUV max of 5.0. Correlation with CT is challenging without IV contrast. Partially calcified mass straddling the right major fissure measures 4.1 x 5.5 cm with an SUV max of 23.1. There is a small hypermetabolic nodule along the anterior margin, measuring 1.0 x 1.6 cm (8/21). 12 mm prepericardiac lymph node (4/95) with an SUV max 13.7. Incidental CT findings: Atherosclerotic calcification of the aorta, aortic valve and coronary arteries. Heart is at the upper limits of normal in size to mildly enlarged. Low right paratracheal lymph node measures 12 mm and does not show abnormal hypermetabolism. Tiny right pleural effusion. Image quality is rather degraded by respiratory motion. ABDOMEN/PELVIS: Hypermetabolic left adrenal mass measures 2.4 x 4.4 cm with an SUV max of 16.2. There is marked wall thickening involving the descending duodenum (4/119) with associated abnormal FDG uptake, SUV max 16.2. Periportal lymph nodes measure up to 12 mm (4/111) with an SUV max of 7.7. Additional focal areas of hypermetabolism in the proximal stomach  and proximal jejunum. No abnormal hypermetabolism in the liver, right adrenal gland, spleen or pancreas. Incidental CT findings: Liver, gallbladder and right adrenal gland are unremarkable. Probable renal sinus cysts bilaterally. Scarring in the right kidney. Left kidney appears somewhat atrophic. Spleen, pancreas, stomach and bowel are otherwise grossly unremarkable. Atherosclerotic calcification of the aorta. Bilateral inguinal hernias contain fat. SKELETON: No abnormal hypermetabolism. Incidental CT findings: Degenerative changes in the spine. No worrisome lytic or sclerotic lesions. No osseous erosion involving posterior right ribs which are adjacent to the aforementioned mass. IMPRESSION: 1. 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 2. Hypermetabolic duodenal wall thickening with hypermetabolic periportal adenopathy and additional focal areas of hypermetabolism in the proximal stomach and proximal jejunum, findings which may represent additional metastatic disease. A second primary gastrointestinal tumor cannot be excluded. 3. Tiny right pleural effusion. 4. Aortic atherosclerosis (ICD10-I70.0). Coronary artery calcification. Electronically Signed   By: Lorin Picket M.D.   On: 10/02/2020 09:22   DG Chest Port 1 View  Result Date: 09/25/2020 CLINICAL DATA:  Status post CT-guided biopsy. EXAM: PORTABLE CHEST 1 VIEW COMPARISON:  Images from the CT scan and prior chest x-rays. The most recent is 09/04/2020 FINDINGS: Stable appearing right medial lung mass. No postprocedural pneumothorax or hematoma is identified. IMPRESSION: No postprocedural pneumothorax or hematoma. Electronically Signed   By: Marijo Sanes M.D.   On: 09/25/2020 13:39   CT LUNG MASS BIOPSY  Result Date: 09/25/2020 INDICATION: 80 year old with right lung mass and needs a tissue  diagnosis. EXAM: CT-GUIDED CORE BIOPSY OF RIGHT LUNG MASS MEDICATIONS: Moderate sedation ANESTHESIA/SEDATION: Moderate (conscious) sedation was employed during this procedure. A total of Versed 2.0 mg and Fentanyl 100 mcg was administered intravenously. Moderate Sedation Time: 20 minutes. The patient's level of consciousness and vital signs were monitored continuously by radiology nursing throughout the procedure under my direct supervision. FLUOROSCOPY TIME:  None COMPLICATIONS: None immediate. PROCEDURE: Informed written consent was obtained from the patient after a thorough discussion of the procedural risks, benefits and alternatives. All questions were addressed. A timeout was performed prior to the initiation of the procedure. Patient was placed prone. CT images through the chest were obtained. Right posterior lung mass was identified and targeted. The right side of the back was prepped with chlorhexidine and sterile field was created. Skin and soft tissues were anesthetized using 1% lidocaine. Using CT guidance, a 17 gauge coaxial needle  was directed into the right lung mass. Three core biopsies were obtained with an 18 gauge core device. Specimens placed in formalin. 17 gauge coaxial needle was removed without complication. Bandage placed over the puncture site. FINDINGS: Large pleural-based mass in the posterior right upper lobe. Mass appears to be extending into the posterior chest wall. Coaxial needle was directed into the mass lesion. Three core samples were obtained. No evidence for pneumothorax on the final images. IMPRESSION: CT-guided core biopsy of the right lung mass. Electronically Signed   By: Markus Daft M.D.   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 The tumor histomorphology and immunoprofile with this new disease appears most  consistent with recurrent/metastasis of this breast sarcoma.  The patient is quite symptomatic from her right upper lung mass which is invaded into the chest wall and rib cage area.  She would be a good candidate for short course of palliative radiation therapy.  Given the tumor histology would recommend high-dose per fraction to give her the best chance for benefit in terms of palliative radiation therapy.  She is an obvious pain despite her Percocet around-the-clock.  I will switch her to Dilaudid 4 mg every 4 hours.  She may also require a long-acting pain medication such as a Duragesic patch or MS Contin.  Today, I talked to the patient and son about the findings and work-up thus far.  We discussed the natural history of lung cancer and general treatment, highlighting the role of radiotherapy in the management.  We discussed the available radiation techniques, and focused on the details of logistics and delivery.  We reviewed the anticipated acute and late sequelae associated with radiation in this setting.  The patient was encouraged to ask questions that I answered to the best of my ability.  A patient consent form was discussed and signed.  We retained a copy for our records.  The patient would like to proceed with radiation and will be scheduled for CT simulation.  PLAN: The patient will return on April 4 for CT simulation with treatments to begin a couple days later.  Anticipate 10 fractions directed at the large right upper lung mass.  Total time spent in this encounter was 60 minutes which included reviewing the patient's most recent consultations, follow-ups, chest x-ray, chest/abdomen CT scan, PET scan, biopsy, pathology report, physical examination, and documentation.  ------------------------------------------------  Blair Promise, PhD, MD  This document serves as a record of services personally performed by Gery Pray, MD. It was created on his behalf by Clerance Lav, a trained  medical scribe. The creation of this record is based on the scribe's personal observations and the provider's statements to them. This document has been checked and approved by the attending provider.

## 2020-10-09 ENCOUNTER — Ambulatory Visit
Admission: RE | Admit: 2020-10-09 | Discharge: 2020-10-09 | Disposition: A | Discharge status: Home / Self Care | Payer: Medicare Other | Source: Ambulatory Visit | Attending: Radiation Oncology | Admitting: Radiation Oncology

## 2020-10-09 ENCOUNTER — Other Ambulatory Visit: Payer: Self-pay

## 2020-10-09 DIAGNOSIS — C3411 Malignant neoplasm of upper lobe, right bronchus or lung: Secondary | ICD-10-CM | POA: Insufficient documentation

## 2020-10-09 DIAGNOSIS — Z510 Encounter for antineoplastic radiation therapy: Secondary | ICD-10-CM | POA: Diagnosis not present

## 2020-10-11 ENCOUNTER — Ambulatory Visit
Admission: RE | Admit: 2020-10-11 | Discharge: 2020-10-11 | Disposition: A | Discharge status: Home / Self Care | Payer: Medicare Other | Source: Ambulatory Visit | Attending: Radiation Oncology | Admitting: Radiation Oncology

## 2020-10-11 DIAGNOSIS — Z510 Encounter for antineoplastic radiation therapy: Secondary | ICD-10-CM | POA: Diagnosis not present

## 2020-10-11 DIAGNOSIS — C50912 Malignant neoplasm of unspecified site of left female breast: Secondary | ICD-10-CM

## 2020-10-12 ENCOUNTER — Encounter (HOSPITAL_COMMUNITY): Payer: Self-pay | Admitting: Internal Medicine

## 2020-10-12 ENCOUNTER — Ambulatory Visit
Admission: RE | Admit: 2020-10-12 | Discharge: 2020-10-12 | Disposition: A | Discharge status: Home / Self Care | Payer: Medicare Other | Source: Ambulatory Visit | Attending: Radiation Oncology | Admitting: Radiation Oncology

## 2020-10-12 ENCOUNTER — Other Ambulatory Visit: Payer: Self-pay | Admitting: Radiation Oncology

## 2020-10-12 ENCOUNTER — Other Ambulatory Visit: Payer: Self-pay | Admitting: Radiology

## 2020-10-12 DIAGNOSIS — Z510 Encounter for antineoplastic radiation therapy: Secondary | ICD-10-CM | POA: Diagnosis not present

## 2020-10-12 DIAGNOSIS — R112 Nausea with vomiting, unspecified: Secondary | ICD-10-CM

## 2020-10-12 DIAGNOSIS — C3411 Malignant neoplasm of upper lobe, right bronchus or lung: Secondary | ICD-10-CM

## 2020-10-12 MED ORDER — LORAZEPAM 1 MG PO TABS
0.5000 mg | ORAL_TABLET | Freq: Once | ORAL | Status: DC
  Filled 2020-10-12: qty 0.5

## 2020-10-12 MED ORDER — LORAZEPAM 1 MG PO TABS
0.5000 mg | ORAL_TABLET | Freq: Once | ORAL | Status: AC
  Administered 2020-10-12: 0.5 mg via ORAL
  Filled 2020-10-12: qty 0.5

## 2020-10-12 MED ORDER — LORAZEPAM 0.5 MG PO TABS: 0.5000 mg | ORAL_TABLET | Freq: Three times a day (TID) | ORAL | 0 refills | Status: DC

## 2020-10-13 ENCOUNTER — Ambulatory Visit
Admission: RE | Admit: 2020-10-13 | Discharge: 2020-10-13 | Disposition: A | Discharge status: Home / Self Care | Payer: Medicare Other | Source: Ambulatory Visit | Attending: Radiation Oncology | Admitting: Radiation Oncology

## 2020-10-13 ENCOUNTER — Other Ambulatory Visit: Payer: Self-pay

## 2020-10-13 DIAGNOSIS — Z510 Encounter for antineoplastic radiation therapy: Secondary | ICD-10-CM | POA: Diagnosis not present

## 2020-10-16 ENCOUNTER — Other Ambulatory Visit: Payer: Self-pay

## 2020-10-16 ENCOUNTER — Ambulatory Visit
Admission: RE | Admit: 2020-10-16 | Discharge: 2020-10-16 | Disposition: A | Discharge status: Home / Self Care | Payer: Medicare Other | Source: Ambulatory Visit | Attending: Radiation Oncology | Admitting: Radiation Oncology

## 2020-10-16 DIAGNOSIS — Z510 Encounter for antineoplastic radiation therapy: Secondary | ICD-10-CM | POA: Diagnosis not present

## 2020-10-17 ENCOUNTER — Inpatient Hospital Stay: Payer: Medicare Other

## 2020-10-17 ENCOUNTER — Inpatient Hospital Stay: Payer: Medicare Other | Attending: Internal Medicine | Admitting: Internal Medicine

## 2020-10-17 ENCOUNTER — Other Ambulatory Visit: Payer: Self-pay

## 2020-10-17 ENCOUNTER — Ambulatory Visit
Admission: RE | Admit: 2020-10-17 | Discharge: 2020-10-17 | Disposition: A | Discharge status: Home / Self Care | Payer: Medicare Other | Source: Ambulatory Visit | Attending: Radiation Oncology | Admitting: Radiation Oncology

## 2020-10-17 VITALS — BP 114/71 | HR 106 | Temp 97.8°F | Resp 18 | Ht 67.0 in | Wt 153.9 lb

## 2020-10-17 DIAGNOSIS — R5383 Other fatigue: Secondary | ICD-10-CM | POA: Insufficient documentation

## 2020-10-17 DIAGNOSIS — I700 Atherosclerosis of aorta: Secondary | ICD-10-CM | POA: Diagnosis not present

## 2020-10-17 DIAGNOSIS — R918 Other nonspecific abnormal finding of lung field: Secondary | ICD-10-CM | POA: Insufficient documentation

## 2020-10-17 DIAGNOSIS — J986 Disorders of diaphragm: Secondary | ICD-10-CM | POA: Insufficient documentation

## 2020-10-17 DIAGNOSIS — R531 Weakness: Secondary | ICD-10-CM | POA: Diagnosis not present

## 2020-10-17 DIAGNOSIS — M1990 Unspecified osteoarthritis, unspecified site: Secondary | ICD-10-CM | POA: Diagnosis not present

## 2020-10-17 DIAGNOSIS — C44591 Other specified malignant neoplasm of skin of breast: Secondary | ICD-10-CM | POA: Diagnosis not present

## 2020-10-17 DIAGNOSIS — C419 Malignant neoplasm of bone and articular cartilage, unspecified: Secondary | ICD-10-CM

## 2020-10-17 DIAGNOSIS — E279 Disorder of adrenal gland, unspecified: Secondary | ICD-10-CM | POA: Insufficient documentation

## 2020-10-17 DIAGNOSIS — R630 Anorexia: Secondary | ICD-10-CM | POA: Diagnosis not present

## 2020-10-17 DIAGNOSIS — R590 Localized enlarged lymph nodes: Secondary | ICD-10-CM | POA: Insufficient documentation

## 2020-10-17 DIAGNOSIS — Z79899 Other long term (current) drug therapy: Secondary | ICD-10-CM | POA: Diagnosis not present

## 2020-10-17 DIAGNOSIS — Z9049 Acquired absence of other specified parts of digestive tract: Secondary | ICD-10-CM | POA: Insufficient documentation

## 2020-10-17 DIAGNOSIS — R112 Nausea with vomiting, unspecified: Secondary | ICD-10-CM | POA: Diagnosis not present

## 2020-10-17 DIAGNOSIS — G893 Neoplasm related pain (acute) (chronic): Secondary | ICD-10-CM

## 2020-10-17 DIAGNOSIS — Z853 Personal history of malignant neoplasm of breast: Secondary | ICD-10-CM | POA: Diagnosis not present

## 2020-10-17 DIAGNOSIS — Z888 Allergy status to other drugs, medicaments and biological substances status: Secondary | ICD-10-CM | POA: Insufficient documentation

## 2020-10-17 DIAGNOSIS — Z90722 Acquired absence of ovaries, bilateral: Secondary | ICD-10-CM | POA: Insufficient documentation

## 2020-10-17 DIAGNOSIS — Z923 Personal history of irradiation: Secondary | ICD-10-CM | POA: Insufficient documentation

## 2020-10-17 DIAGNOSIS — R079 Chest pain, unspecified: Secondary | ICD-10-CM | POA: Diagnosis not present

## 2020-10-17 DIAGNOSIS — J90 Pleural effusion, not elsewhere classified: Secondary | ICD-10-CM | POA: Insufficient documentation

## 2020-10-17 DIAGNOSIS — C50912 Malignant neoplasm of unspecified site of left female breast: Secondary | ICD-10-CM

## 2020-10-17 DIAGNOSIS — K4020 Bilateral inguinal hernia, without obstruction or gangrene, not specified as recurrent: Secondary | ICD-10-CM | POA: Insufficient documentation

## 2020-10-17 DIAGNOSIS — M549 Dorsalgia, unspecified: Secondary | ICD-10-CM | POA: Diagnosis not present

## 2020-10-17 DIAGNOSIS — Z510 Encounter for antineoplastic radiation therapy: Secondary | ICD-10-CM | POA: Diagnosis not present

## 2020-10-17 DIAGNOSIS — R059 Cough, unspecified: Secondary | ICD-10-CM | POA: Insufficient documentation

## 2020-10-17 DIAGNOSIS — Z882 Allergy status to sulfonamides status: Secondary | ICD-10-CM | POA: Insufficient documentation

## 2020-10-17 LAB — CMP (CANCER CENTER ONLY)
ALT: 6 U/L (ref 0–44)
AST: 13 U/L — ABNORMAL LOW (ref 15–41)
Albumin: 2.9 g/dL — ABNORMAL LOW (ref 3.5–5.0)
Alkaline Phosphatase: 65 U/L (ref 38–126)
Anion gap: 12 (ref 5–15)
BUN: 15 mg/dL (ref 8–23)
CO2: 30 mmol/L (ref 22–32)
Calcium: 9.7 mg/dL (ref 8.9–10.3)
Chloride: 100 mmol/L (ref 98–111)
Creatinine: 0.74 mg/dL (ref 0.44–1.00)
GFR, Estimated: >60 mL/min (ref >60)
Glucose, Bld: 95 mg/dL (ref 70–99)
Potassium: 3.8 mmol/L (ref 3.5–5.1)
Sodium: 142 mmol/L (ref 135–145)
Total Bilirubin: 0.4 mg/dL (ref 0.3–1.2)
Total Protein: 6.7 g/dL (ref 6.5–8.1)

## 2020-10-17 LAB — CBC WITH DIFFERENTIAL (CANCER CENTER ONLY)
Abs Immature Granulocytes: 0.05 10*3/uL (ref 0.00–0.07)
Basophils Absolute: 0 10*3/uL (ref 0.0–0.1)
Basophils Relative: 0 %
Eosinophils Absolute: 0.1 10*3/uL (ref 0.0–0.5)
Eosinophils Relative: 1 %
HCT: 36.3 % (ref 36.0–46.0)
Hemoglobin: 11.5 g/dL — ABNORMAL LOW (ref 12.0–15.0)
Immature Granulocytes: 1 %
Lymphocytes Relative: 15 %
Lymphs Abs: 1.5 10*3/uL (ref 0.7–4.0)
MCH: 27.9 pg (ref 26.0–34.0)
MCHC: 31.7 g/dL (ref 30.0–36.0)
MCV: 88.1 fL (ref 80.0–100.0)
Monocytes Absolute: 0.9 10*3/uL (ref 0.1–1.0)
Monocytes Relative: 9 %
Neutro Abs: 7.3 10*3/uL (ref 1.7–7.7)
Neutrophils Relative %: 74 %
Platelet Count: 418 10*3/uL — ABNORMAL HIGH (ref 150–400)
RBC: 4.12 MIL/uL (ref 3.87–5.11)
RDW: 14.2 % (ref 11.5–15.5)
WBC Count: 9.9 10*3/uL (ref 4.0–10.5)
nRBC: 0 % (ref 0.0–0.2)

## 2020-10-17 MED ORDER — FENTANYL 25 MCG/HR TD PT72: 1.0000 | MEDICATED_PATCH | TRANSDERMAL | 0 refills | Status: DC

## 2020-10-17 NOTE — Progress Notes (Signed)
[place] [phone]   [phone]  OFFICE PROGRESS NOTE  Ann Huddle, MD 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  The metastatic disease was diagnosed in March 2022 and presented with right lung mass with right supraclavicular/right hilar/prepericardiac and left adrenal mass. The patient also had hypermetabolic duodenal wall thickening and hypermetabolic periportal adenopathy in addition to areas of hypermetabolism in the proximal stomach and proximal jejunum  Biomarker Findings Microsatellite status - MS-Stable Tumor Mutational Burden - 3 Muts/Mb Genomic Findings For a complete list of the genes assayed, please refer to the Appendix. KIT amplification PDGFRA amplification PIK3R1 [id number], R458f*5 CDKN2A/B CDKN2A loss, CDKN2B loss KDR amplification TERT promoter -124C>T TP53 R280K  PRIOR THERAPY: Status post left mastectomy on September 29, 2007  CURRENT THERAPY: Palliative radiotherapy to the right lung mass under the care of Dr. KSondra Come INTERVAL HISTORY: Ann Kizziah80y.o. female returns to the clinic today for follow-up visit accompanied by friend.  The patient continues to have increasing fatigue and weakness as well as pain on the right scapular area.  She is currently undergoing palliative radiotherapy to the right lung mass under the care of Dr. KSondra Come  She is also on pain medication Dilaudid with no significant improvement in her pain.  She also has frequent nausea and vomiting and she actually vomited in the office today.  She also on treatment with gabapentin.  The patient was referred to Dr. RAngelina Okat DGretnacenter 2 weeks  ago who specializes in sarcoma she has not received any appointment for them yet.  She is here today for evaluation and recommendation regarding her condition.  MEDICAL HISTORY: Past Medical History:  Diagnosis Date  . Arthritis   . B12 deficiency anemia   . Diabetes mellitus ORAL AND INSULIN MEDS  . Fibrosis of knee joint RIGHT  . Heart palpitations SINCE 2008 TAKES BETA BLOCKER  . 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 Hyperlipidemia   . Hypertension   . Neuropathy, diabetic ([place] RIGHT FOOT MILD NUMBNESS  . PONV (postoperative nausea and vomiting)     ALLERGIES:  is allergic to abaloparatide, contrast media [iodinated diagnostic agents], empagliflozin, epinephrine, exenatide, liraglutide, parathyroid hormone (recomb), and sulfa antibiotics.  MEDICATIONS:  Current Outpatient Medications  Medication Sig Dispense Refill  . cetirizine (ZYRTEC) 10 MG tablet Take 10 mg by mouth daily.    . Cholecalciferol 50 MCG (2000 UT) CAPS Take 1 capsule by mouth daily.    . furosemide (LASIX) 40 MG tablet Take 20 mg by mouth daily.    .Marland Kitchengabapentin (NEURONTIN) 100 MG capsule Take 1 capsule (100 mg total) by mouth 3 (three) times daily. 90 capsule 1  . HYDROmorphone (DILAUDID) 4 MG tablet Take 1 tablet (4 mg total) by mouth every 4 (four) hours as needed for severe pain. 45 tablet 0  . insulin glargine (LANTUS) 100 UNIT/ML injection Inject 55 Units into the skin every morning.    .Marland KitchenJANUMET 50-500 MG tablet Take 1 tablet by mouth 2 (two) times daily.    .Marland Kitchenlisinopril (PRINIVIL,ZESTRIL) 5 MG tablet  Take 2.5 mg by mouth daily.     Marland Kitchen LORazepam (ATIVAN) 0.5 MG tablet Take 1 tablet (0.5 mg total) by mouth every 8 (eight) hours. Place sublingual as needed for nausea 30 tablet 0  . LYLLANA 0.025 MG/24HR Place 1 patch onto the skin 2 (two) times a week.    . methocarbamol (ROBAXIN) 500 MG tablet Take 1  tablet (500 mg total) by mouth every 6 (six) hours as needed for muscle spasms. (Patient not taking: Reported on 10/05/2020) 40 tablet 1  . metoprolol succinate (TOPROL-XL) 50 MG 24 hr tablet Take 50 mg by mouth daily. Take with or immediately following a meal.    . oxyCODONE-acetaminophen (PERCOCET/ROXICET) 5-325 MG tablet Take 1 tablet by mouth every 6 (six) hours as needed for severe pain. 40 tablet 0  . potassium chloride SA (KLOR-CON) 20 MEQ tablet Take 1 tablet (20 mEq total) by mouth daily. 10 tablet 0  . prochlorperazine (COMPAZINE) 10 MG tablet Take 1 tablet (10 mg total) by mouth every 6 (six) hours as needed for nausea or vomiting. Take one tablet ( 10 mg) 30 minutes prior to pain med every 6 hours prn. (Patient not taking: Reported on 10/05/2020) 30 tablet 0  . rosuvastatin (CRESTOR) 40 MG tablet Take 10 mg by mouth daily.     No current facility-administered medications for this visit.    SURGICAL HISTORY:  Past Surgical History:  Procedure Laterality Date  . ABDOMINAL HYSTERECTOMY    . BALLOON DILATION N/A 06/12/2015   Procedure: BALLOON DILATION;  Surgeon: [name], MD;  Location: [facility];  Service: Endoscopy;  Laterality: N/A;  . BREAST LUMPECTOMY  07-27-2007   LEFT   . CARDIAC CATHETERIZATION  2001   MIMINAL PLAQUE RCA, CIRCUMFLEX, LAD;   NORMAL LVF  . CERVICAL FUSION  1996   C3 - 5  . ESOPHAGOGASTRODUODENOSCOPY (EGD) WITH PROPOFOL N/A 06/12/2015   Procedure: ESOPHAGOGASTRODUODENOSCOPY (EGD) WITH PROPOFOL;  Surgeon: [name], MD;  Location: [facility];  Service: Endoscopy;  Laterality: N/A;  . EXCHANGE LEFT BREAST IMPLANT/ CAPSULOTOMY/ RIGHT MASTOPEXY  11-28-2008  . JOINT REPLACEMENT    . KNEE ARTHROSCOPY  1999   RIGHT  . KNEE ARTHROTOMY  04-03-2011   RIGHT KNEE/ SCAR EXCISION/ POLYTHYLENE EXCHANGE  . KNEE CLOSED REDUCTION  07/31/2011   Procedure: CLOSED MANIPULATION KNEE;  Surgeon: [name], MD;  Location: [facility];  Service:  Orthopedics;  Laterality: Right;  . LEFT BREAST BX ;  X2  1976  . MANIPULATION KNEE JOINT  04-02-2006   CLOSED --  RIGHT   . TOTAL ABDOMINAL HYSTERECTOMY W/ BILATERAL SALPINGOOPHORECTOMY  1989   AND APPENDECTOMY  . TOTAL KNEE ARTHROPLASTY  02-03-2006   RIGHT  . TOTAL KNEE ARTHROPLASTY Left 10/23/2015   Procedure: TOTAL LEFT KNEE ARTHROPLASTY;  Surgeon: [name], MD;  Location: [facility];  Service: Orthopedics;  Laterality: Left;  . TOTAL LEFT MASTECTOMY  09-29-2007  . TUBAL LIGATION  1976  . URETHRAL DILATION  1976    REVIEW OF SYSTEMS:  Constitutional: positive for anorexia and fatigue Eyes: negative Ears, nose, mouth, throat, and face: negative Respiratory: positive for cough and pleurisy/chest pain Cardiovascular: negative Gastrointestinal: positive for nausea and vomiting Genitourinary:negative Integument/breast: negative Hematologic/lymphatic: negative Musculoskeletal:positive for back pain Neurological: negative Behavioral/Psych: negative Endocrine: negative Allergic/Immunologic: negative   PHYSICAL EXAMINATION: General appearance: alert, cooperative, fatigued and no distress Head: Normocephalic, without obvious abnormality, atraumatic Neck: no adenopathy, no JVD, supple, symmetrical, trachea midline and thyroid not  enlarged, symmetric, no tenderness/mass/nodules Lymph nodes: Cervical, supraclavicular, and axillary nodes normal. Resp: clear to auscultation bilaterally Back: symmetric, no curvature. ROM normal. No CVA tenderness. Cardio: regular rate and rhythm, S1, S2 normal, no murmur, click, rub or gallop GI: soft, non-tender; bowel sounds normal; no masses,  no organomegaly Extremities: extremities normal, atraumatic, no cyanosis or edema Neurologic: Alert and oriented X 3, normal strength and tone. Normal symmetric reflexes. Normal coordination and gait  ECOG PERFORMANCE STATUS: 1 - Symptomatic but completely ambulatory  Blood pressure 114/71, pulse (!) 106,  temperature 97.8 F (36.6 C), temperature source Tympanic, resp. rate 18, height 5' 7"  (1.702 m), weight 153 lb 14.4 oz (69.8 kg), SpO2 97 %.  LABORATORY DATA: Lab Results  Component Value Date   WBC 9.9 10/17/2020   HGB 11.5 (L) 10/17/2020   HCT 36.3 10/17/2020   MCV 88.1 10/17/2020   PLT 418 (H) 10/17/2020      Chemistry      Component Value Date/Time   NA 143 10/03/2020 1256   NA 141 07/20/2012 0959   K 3.1 (L) 10/03/2020 1256   K 3.9 07/20/2012 0959   CL 99 10/03/2020 1256   CL 110 (H) 07/20/2012 0959   CO2 30 10/03/2020 1256   CO2 23 07/20/2012 0959   BUN 9 10/03/2020 1256   BUN 16.0 07/20/2012 0959   CREATININE 0.76 10/03/2020 1256   CREATININE 0.8 07/20/2012 0959      Component Value Date/Time   CALCIUM 8.9 10/03/2020 1256   CALCIUM 9.2 07/20/2012 0959   ALKPHOS 69 10/03/2020 1256   ALKPHOS 56 07/20/2012 0959   AST 11 (L) 10/03/2020 1256   AST 15 07/20/2012 0959   ALT 8 10/03/2020 1256   ALT 15 07/20/2012 0959   BILITOT 0.4 10/03/2020 1256   BILITOT 0.50 07/20/2012 0959       RADIOGRAPHIC STUDIES: NM PET Image Initial (PI) Skull Base To Thigh  Result Date: 10/02/2020 CLINICAL DATA:  Initial treatment strategy for right upper lobe mass. EXAM: NUCLEAR MEDICINE PET SKULL BASE TO THIGH TECHNIQUE: 7.8 mCi F-18 FDG was injected intravenously. Full-ring PET imaging was performed from the skull base to thigh after the radiotracer. CT data was obtained and used for attenuation correction and anatomic localization. Fasting blood glucose: 99 mg/dl COMPARISON:  CT chest abdomen pelvis 09/08/2020. FINDINGS: Mediastinal blood pool activity: SUV max 2.8 Liver activity: SUV max NA NECK: No abnormal hypermetabolism. Incidental CT findings: None. CHEST: Right supraclavicular lymph node measures 7 mm (4/56) with an SUV max of 4.6. Right hilar hypermetabolism has an SUV max of 5.0. Correlation with CT is challenging without IV contrast. Partially calcified mass straddling the right  major fissure measures 4.1 x 5.5 cm with an SUV max of 23.1. There is a small hypermetabolic nodule along the anterior margin, measuring 1.0 x 1.6 cm (8/21). 12 mm prepericardiac lymph node (4/95) with an SUV max 13.7. Incidental CT findings: Atherosclerotic calcification of the aorta, aortic valve and coronary arteries. Heart is at the upper limits of normal in size to mildly enlarged. Low right paratracheal lymph node measures 12 mm and does not show abnormal hypermetabolism. Tiny right pleural effusion. Image quality is rather degraded by respiratory motion. ABDOMEN/PELVIS: Hypermetabolic left adrenal mass measures 2.4 x 4.4 cm with an SUV max of 16.2. There is marked wall thickening involving the descending duodenum (4/119) with associated abnormal FDG uptake, SUV max 16.2. Periportal lymph nodes measure up to 12 mm (4/111) with an SUV max of 7.7.  Additional focal areas of hypermetabolism in the proximal stomach and proximal jejunum. No abnormal hypermetabolism in the liver, right adrenal gland, spleen or pancreas. Incidental CT findings: Liver, gallbladder and right adrenal gland are unremarkable. Probable renal sinus cysts bilaterally. Scarring in the right kidney. Left kidney appears somewhat atrophic. Spleen, pancreas, stomach and bowel are otherwise grossly unremarkable. Atherosclerotic calcification of the aorta. Bilateral inguinal hernias contain fat. SKELETON: No abnormal hypermetabolism. Incidental CT findings: Degenerative changes in the spine. No worrisome lytic or sclerotic lesions. No osseous erosion involving posterior right ribs which are adjacent to the aforementioned mass. IMPRESSION: 1. 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 2. Hypermetabolic duodenal wall thickening with hypermetabolic periportal adenopathy and additional focal areas of  hypermetabolism in the proximal stomach and proximal jejunum, findings which may represent additional metastatic disease. A second primary gastrointestinal tumor cannot be excluded. 3. Tiny right pleural effusion. 4. Aortic atherosclerosis (ICD10-I70.0). Coronary artery calcification. Electronically Signed   By: Lorin Picket M.D.   On: 10/02/2020 09:22   DG Chest Port 1 View  Result Date: 09/25/2020 CLINICAL DATA:  Status post CT-guided biopsy. EXAM: PORTABLE CHEST 1 VIEW COMPARISON:  Images from the CT scan and prior chest x-rays. The most recent is 09/04/2020 FINDINGS: Stable appearing right medial lung mass. No postprocedural pneumothorax or hematoma is identified. IMPRESSION: No postprocedural pneumothorax or hematoma. Electronically Signed   By: Marijo Sanes M.D.   On: 09/25/2020 13:39   CT LUNG MASS BIOPSY  Result Date: 09/25/2020 INDICATION: 80 year old with right lung mass and needs a tissue diagnosis. EXAM: CT-GUIDED CORE BIOPSY OF RIGHT LUNG MASS MEDICATIONS: Moderate sedation ANESTHESIA/SEDATION: Moderate (conscious) sedation was employed during this procedure. A total of Versed 2.0 mg and Fentanyl 100 mcg was administered intravenously. Moderate Sedation Time: 20 minutes. The patient's level of consciousness and vital signs were monitored continuously by radiology nursing throughout the procedure under my direct supervision. FLUOROSCOPY TIME:  None COMPLICATIONS: None immediate. PROCEDURE: Informed written consent was obtained from the patient after a thorough discussion of the procedural risks, benefits and alternatives. All questions were addressed. A timeout was performed prior to the initiation of the procedure. Patient was placed prone. CT images through the chest were obtained. Right posterior lung mass was identified and targeted. The right side of the back was prepped with chlorhexidine and sterile field was created. Skin and soft tissues were anesthetized using 1% lidocaine. Using CT  guidance, a 17 gauge coaxial needle was directed into the right lung mass. Three core biopsies were obtained with an 18 gauge core device. Specimens placed in formalin. 17 gauge coaxial needle was removed without complication. Bandage placed over the puncture site. FINDINGS: Large pleural-based mass in the posterior right upper lobe. Mass appears to be extending into the posterior chest wall. Coaxial needle was directed into the mass lesion. Three core samples were obtained. No evidence for pneumothorax on the final images. IMPRESSION: CT-guided core biopsy of the right lung mass. Electronically Signed   By: Markus Daft M.D.   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 Unfortunately her PET scan showed hypermetabolic right lung mass with hypermetabolic right suprahilar/right hilar/pericardiac lymph node and left adrenal mass.  There was also hypermetabolic duodenal wall thickening with hypermetabolic periportal adenopathy and additional focal areas of hypermetabolism in the proximal stomach and proximal jejunum.  These findings are suggestive additional areas of metastatic disease or second malignancy in that area. The patient is currently undergoing palliative radiotherapy to the right lung mass with chest wall invasion under the care of Dr. Sondra Come. She continues to have significant pain in that area and she is currently on Dilaudid and gabapentin with minimal improvement in her condition. For the pain management, I will add fentanyl patch 25 mcg/hour every 3 days in addition to her current breakthrough pain medication.   I will adjust the dose of the fentanyl patch as tolerated and as needed for pain. For the systemic treatment, we called the sarcoma navigator at [facility] to make sure the patient gets an appointment as soon as possible for discussion of her condition and second opinion regarding her treatment. I will see her back for follow-up visit in around 2 weeks for evaluation and more detailed discussion of her systemic treatment options based on her visit with Dr. Angelina Ok. For the nausea and vomiting, she will continue her current treatment with Zofran ODT. She was advised to call immediately if she has any other concerning symptoms in the interval. The patient voices understanding of current disease status and treatment options and is in agreement with the current care plan.  All questions were answered. The patient knows to call the clinic with any problems, questions or concerns. We can certainly see the patient much sooner if necessary. The total time spent in the appointment was 35 minutes.  Disclaimer: This note was dictated with voice recognition software. Similar sounding words can inadvertently be transcribed and may not be corrected upon review.

## 2020-10-18 ENCOUNTER — Telehealth: Payer: Self-pay | Admitting: Medical Oncology

## 2020-10-18 ENCOUNTER — Ambulatory Visit
Admission: RE | Admit: 2020-10-18 | Discharge: 2020-10-18 | Disposition: A | Discharge status: Home / Self Care | Payer: Medicare Other | Source: Ambulatory Visit | Attending: Radiation Oncology | Admitting: Radiation Oncology

## 2020-10-18 DIAGNOSIS — Z510 Encounter for antineoplastic radiation therapy: Secondary | ICD-10-CM | POA: Diagnosis not present

## 2020-10-18 NOTE — Telephone Encounter (Signed)
Faxed demos and financials to [employer].

## 2020-10-19 ENCOUNTER — Ambulatory Visit
Admission: RE | Admit: 2020-10-19 | Discharge: 2020-10-19 | Disposition: A | Discharge status: Home / Self Care | Payer: Medicare Other | Source: Ambulatory Visit | Attending: Radiation Oncology | Admitting: Radiation Oncology

## 2020-10-19 ENCOUNTER — Telehealth: Payer: Self-pay | Admitting: Medical Oncology

## 2020-10-19 ENCOUNTER — Other Ambulatory Visit: Payer: Self-pay

## 2020-10-19 DIAGNOSIS — Z510 Encounter for antineoplastic radiation therapy: Secondary | ICD-10-CM | POA: Diagnosis not present

## 2020-10-19 NOTE — Telephone Encounter (Signed)
Vomiting every day despite taking zofran.    appt at [facility] with Dr Deberah Castle on  April 22 at 2pm. 4/27-f/u appt with Dr Nicanor Alcon.

## 2020-10-20 ENCOUNTER — Ambulatory Visit
Admission: RE | Admit: 2020-10-20 | Discharge: 2020-10-20 | Disposition: A | Discharge status: Home / Self Care | Payer: Medicare Other | Source: Ambulatory Visit | Attending: Radiation Oncology | Admitting: Radiation Oncology

## 2020-10-20 ENCOUNTER — Encounter: Payer: Self-pay | Admitting: General Practice

## 2020-10-20 ENCOUNTER — Other Ambulatory Visit: Payer: Self-pay | Admitting: Internal Medicine

## 2020-10-20 ENCOUNTER — Other Ambulatory Visit: Payer: Self-pay | Admitting: Medical Oncology

## 2020-10-20 ENCOUNTER — Telehealth: Payer: Self-pay | Admitting: Medical Oncology

## 2020-10-20 DIAGNOSIS — R112 Nausea with vomiting, unspecified: Secondary | ICD-10-CM

## 2020-10-20 DIAGNOSIS — Z510 Encounter for antineoplastic radiation therapy: Secondary | ICD-10-CM | POA: Diagnosis not present

## 2020-10-20 MED ORDER — OLANZAPINE 10 MG PO TBDP: 10.0000 mg | ORAL_TABLET | Freq: Every evening | ORAL | 10 refills | Status: DC | PRN

## 2020-10-20 NOTE — Progress Notes (Addendum)
[facility] CSW Progress Notes  Request from Abelina Bachelor RN to call son w resources for in home aide and similar.  Called son [name]  [phone] per message from Clydia Llano.  He is not available to speak til this afternoon, requests a call back.  CSW will attempt to recontact.  Edwyna Shell, LCSW Clinical Social Worker Phone:  [phone]

## 2020-10-20 NOTE — Telephone Encounter (Addendum)
Tori at [facility] said she sent an email to pt. I spoke to [employer] and gave him Tori's contact information.  I also told [name] (per phamacist at [facility])   if the 10 mg makes pt too drowsy ,he can cut pill in half for 5 mg dose.

## 2020-10-20 NOTE — Telephone Encounter (Signed)
Pharmacy does not have zyprex ODT . I told pharmacist to change to tablet to swallow per Dr Julien Nordmann.

## 2020-10-20 NOTE — Progress Notes (Signed)
[facility] Progress Notes  Reached son [name] by phone, he requests his [name] be included as well as "she is familiar with the situation."  "Since she has been under treatment for cancer, she is very sick, nauseated.  My father has fibrosis of the lungs, he cannot care for himself."  Neither one is able to care for each other at this point.  "At this point she cannot take care of her own basic needs, like being fed and taking medicine."  Note that patient is "cloudy" in her thoughts, both she and husband have impairments in short term memory.  Live in their own home, a hospice nurse comes for husband who checks in/gives bath once/week.  Family has felt that "someone needs to be there all the time", this is not possible.    Family has not discussed moving patient and husband out of the home into a facility.  Given serious illness of both patient and husband, family believes they do not have a lengthy life expectancy.  Discussed options available under [place] which do not pay for long term/custodial type care.  Patient is presumed not to be [place].  Discussed that sitter/aide positions are private pay - referred to Glenice Bow, [employer] Navigator with [employer] for referrals for aide options.  Also suggested family engage services of geriatric case manager as both patient and husband have significant needs in the community.  Provided contact information for these individuals, encouraged family to call back as needed to discuss progress.  Edwyna Shell, LCSW Clinical Social Worker Phone:  [phone]

## 2020-10-20 NOTE — Telephone Encounter (Signed)
Nausea is somewhat less today, but persists on compazine and zofran. Per Dr Julien Nordmann I told pt that he prescribed Zyprexa 10 mg q hs prn for her nausea.  I instructed pt do not take the compazine . If zyprexa is not covered by insurance he said decadron would be his second choice antiemetic.  Loose soft  stool today x 1 after drinking strawberry ensure ( she has been tolerating it). I instructed her to get OTC Imodium AD and take per package instructions.

## 2020-10-23 ENCOUNTER — Ambulatory Visit
Admission: RE | Admit: 2020-10-23 | Discharge: 2020-10-23 | Disposition: A | Discharge status: Home / Self Care | Payer: Medicare Other | Source: Ambulatory Visit | Attending: Radiation Oncology | Admitting: Radiation Oncology

## 2020-10-23 ENCOUNTER — Other Ambulatory Visit: Payer: Self-pay

## 2020-10-23 DIAGNOSIS — Z510 Encounter for antineoplastic radiation therapy: Secondary | ICD-10-CM | POA: Diagnosis not present

## 2020-10-24 ENCOUNTER — Ambulatory Visit
Admission: RE | Admit: 2020-10-24 | Discharge: 2020-10-24 | Disposition: A | Discharge status: Home / Self Care | Payer: Medicare Other | Source: Ambulatory Visit | Attending: Radiation Oncology | Admitting: Radiation Oncology

## 2020-10-24 DIAGNOSIS — Z510 Encounter for antineoplastic radiation therapy: Secondary | ICD-10-CM | POA: Diagnosis not present

## 2020-10-27 ENCOUNTER — Telehealth: Payer: Self-pay

## 2020-10-27 NOTE — Telephone Encounter (Signed)
Dr. Henriette Combs Alfonso Ramus (sp?) from [facility] left a message wanting to speak with Dr. Julien Nordmann. She states she wants to speak with you regarding this evaluation. She requests you call her on her cell phone at [phone].

## 2020-10-30 NOTE — Progress Notes (Signed)
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  The metastatic disease was diagnosed in March 2022 and presented with right lung mass with right supraclavicular/right hilar/prepericardiac and left adrenal mass. The patient also had hypermetabolic duodenal wall thickening and hypermetabolic periportal adenopathy in addition to areas of hypermetabolism in the proximal stomach and proximal jejunum  Biomarker Findings Microsatellite status - MS-Stable Tumor Mutational Burden - 3 Muts/Mb Genomic Findings For a complete list of the genes assayed, please refer to the Appendix. KIT amplification PDGFRA amplification PIK3R1 [id number], R458f*5 CDKN2A/B CDKN2A loss, CDKN2B loss KDR amplification TERT promoter -124C>T TP53 R280K  PRIOR THERAPY:  1) Status post left mastectomy on September 29, 2007 2) Palliative radiotherapy to the right lung mass under the care of Dr. KSondra Come Completed on 10/24/20.   CURRENT THERAPY: Doxorubicin 50 mg per metered squared IV every 4 weeks with Neulasta support on day 3.  First dose expected on 11/09/2020.  INTERVAL HISTORY: Ann [id number].[id number]. female returns to the clinic today for a follow-up visit.  The patient was recently diagnosed with recurrent and metastatic carcinoma.  [facility] is going to discuss her case at a multidisciplinary conference on 11/02/20  to review the PET and distribution of sub-diaphragmatic disease, and consider if she would benefit from a biopsy of stomach or adrenal, to ensure this does not represent a 2nd primary GI  malignancy. If consistent with 2nd primary, she would consider referral for en bloc resection of right post chest wall mass if feasible, and then appropriate treatment of any GI malignancy. If 2nd primary she mentioned it may also be appropriate to consider genetics eval. However, they mostly discussed if this is felt to be c/w malignant phyllodes tumor by consensus, she would recommend Doxil (or alternatively gemcitabine), balancing her age, comorbidities and histology. She is going to continue to follow with [facility] concurrently.    The patient recently completed palliative radiotherapy under the care of Dr. KSondra Comeon 10/24/2020.  She reports her pain has subsided and is under good control. She does request a refill of fentanyl.  Her main complaint is loss of appetite. She started taking zyprexa for nausea/vomiting. She states she has not had nausea or vomiting in over a week. Patient denies any recent fever, chills, or night sweats.  Her weight is down about 2 lbs since her last appointment. She denies shortness of breath or chest pain.  She denies any hemoptysis.  She denies any diarrhea or constipation. She takes stool softener. She is here today for reevaluation and discussion about her current condition and recommended treatment options.    MEDICAL HISTORY: Past Medical History:  Diagnosis Date  . Arthritis   . B12 deficiency anemia   . Diabetes mellitus ORAL AND INSULIN MEDS  . Fibrosis of knee joint RIGHT  . Heart palpitations SINCE 2008 TAKES BETA BLOCKER  . 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 Hyperlipidemia   . Hypertension   . Neuropathy, diabetic (HGrainger RIGHT FOOT MILD NUMBNESS  .  PONV (postoperative nausea and vomiting)     ALLERGIES:  is allergic to abaloparatide, contrast media [iodinated diagnostic agents], empagliflozin, epinephrine, exenatide, liraglutide, parathyroid  hormone (recomb), and sulfa antibiotics.  MEDICATIONS:  Current Outpatient Medications  Medication Sig Dispense Refill  . BD INSULIN SYRINGE U/F 31G X 5/16" 1 ML MISC USE WITH INJECTIONS    . cetirizine (ZYRTEC) 10 MG tablet Take 10 mg by mouth daily.    . Cholecalciferol 50 MCG (2000 UT) CAPS Take 1 capsule by mouth daily.    . furosemide (LASIX) 40 MG tablet Take 20 mg by mouth daily.    Marland Kitchen gabapentin (NEURONTIN) 100 MG capsule Take 1 capsule (100 mg total) by mouth 3 (three) times daily. 90 capsule 1  . HYDROmorphone (DILAUDID) 4 MG tablet Take 1 tablet (4 mg total) by mouth every 4 (four) hours as needed for severe pain. 45 tablet 0  . insulin glargine (LANTUS) 100 UNIT/ML injection Inject 55 Units into the skin every morning.    Marland Kitchen JANUMET 50-500 MG tablet Take 1 tablet by mouth 2 (two) times daily.    Marland Kitchen lidocaine-prilocaine (EMLA) cream Apply 1 application topically as needed. 30 g 2  . lisinopril (PRINIVIL,ZESTRIL) 5 MG tablet Take 2.5 mg by mouth daily.     Marland Kitchen LORazepam (ATIVAN) 0.5 MG tablet Take 1 tablet (0.5 mg total) by mouth every 8 (eight) hours. Place sublingual as needed for nausea 30 tablet 0  . LYLLANA 0.025 MG/24HR Place 1 patch onto the skin 2 (two) times a week.    . methocarbamol (ROBAXIN) 500 MG tablet Take 1 tablet (500 mg total) by mouth every 6 (six) hours as needed for muscle spasms. 40 tablet 1  . metoprolol succinate (TOPROL-XL) 50 MG 24 hr tablet Take 50 mg by mouth daily. Take with or immediately following a meal.    . OLANZapine zydis (ZYPREXA) 10 MG disintegrating tablet Take 1 tablet (10 mg total) by mouth at bedtime as needed. For nausea and /or vomiting. 20 tablet 10  . oxyCODONE-acetaminophen (PERCOCET/ROXICET) 5-325 MG tablet Take 1 tablet by mouth every 6 (six) hours as needed for severe pain. 40 tablet 0  . potassium chloride SA (KLOR-CON) 20 MEQ tablet Take 1 tablet (20 mEq total) by mouth daily. 10 tablet 0  . rosuvastatin (CRESTOR) 40 MG tablet Take 10 mg  by mouth daily.    . fentaNYL (DURAGESIC) 25 MCG/HR Place 1 patch onto the skin every 3 (three) days. 5 patch 0   No current facility-administered medications for this visit.    SURGICAL HISTORY:  Past Surgical History:  Procedure Laterality Date  . ABDOMINAL HYSTERECTOMY    . BALLOON DILATION N/A 06/12/2015   Procedure: BALLOON DILATION;  Surgeon: [name], MD;  Location: [facility];  Service: Endoscopy;  Laterality: N/A;  . BREAST LUMPECTOMY  07-27-2007   LEFT   . CARDIAC CATHETERIZATION  2001   MIMINAL PLAQUE RCA, CIRCUMFLEX, LAD;   NORMAL LVF  . CERVICAL FUSION  1996   C3 - 5  . ESOPHAGOGASTRODUODENOSCOPY (EGD) WITH PROPOFOL N/A 06/12/2015   Procedure: ESOPHAGOGASTRODUODENOSCOPY (EGD) WITH PROPOFOL;  Surgeon: [name], MD;  Location: [facility];  Service: Endoscopy;  Laterality: N/A;  . EXCHANGE LEFT BREAST IMPLANT/ CAPSULOTOMY/ RIGHT MASTOPEXY  11-28-2008  . JOINT REPLACEMENT    . KNEE ARTHROSCOPY  1999   RIGHT  . KNEE ARTHROTOMY  04-03-2011   RIGHT KNEE/ SCAR EXCISION/ POLYTHYLENE EXCHANGE  . KNEE CLOSED REDUCTION  07/31/2011   Procedure:  CLOSED MANIPULATION KNEE;  Surgeon: [name], MD;  Location: [facility];  Service: Orthopedics;  Laterality: Right;  . LEFT BREAST BX ;  X2  1976  . MANIPULATION KNEE JOINT  04-02-2006   CLOSED --  RIGHT   . TOTAL ABDOMINAL HYSTERECTOMY W/ BILATERAL SALPINGOOPHORECTOMY  1989   AND APPENDECTOMY  . TOTAL KNEE ARTHROPLASTY  02-03-2006   RIGHT  . TOTAL KNEE ARTHROPLASTY Left 10/23/2015   Procedure: TOTAL LEFT KNEE ARTHROPLASTY;  Surgeon: [name], MD;  Location: [facility];  Service: Orthopedics;  Laterality: Left;  . TOTAL LEFT MASTECTOMY  09-29-2007  . TUBAL LIGATION  1976  . URETHRAL DILATION  1976    REVIEW OF SYSTEMS:   Review of Systems  Constitutional: Positive for decreased appetite and weight loss. Negative for chills, fever and unexpected weight change.  HENT:   Negative for mouth  sores, nosebleeds, sore throat and trouble swallowing.   Eyes: Negative for eye problems and icterus.  Respiratory: Negative for cough, hemoptysis, shortness of breath and wheezing.   Cardiovascular: Negative for chest pain and leg swelling.  Gastrointestinal: Negative for abdominal pain, constipation, diarrhea, nausea and vomiting (improved).  Genitourinary: Negative for bladder incontinence, difficulty urinating, dysuria, frequency and hematuria.   Musculoskeletal: Negative for back pain, gait problem, neck pain and neck stiffness.  Skin: Negative for itching and rash.  Neurological: Negative for dizziness, extremity weakness, gait problem, headaches, light-headedness and seizures.  Hematological: Negative for adenopathy. Does not bruise/bleed easily.  Psychiatric/Behavioral: Negative for confusion, depression and sleep disturbance. The patient is not nervous/anxious.     PHYSICAL EXAMINATION:  Blood pressure 126/71, pulse (!) 119, temperature 97.7 F (36.5 C), temperature source Tympanic, resp. rate 17, weight 151 lb 11.2 oz (68.8 kg), SpO2 100 %.  ECOG PERFORMANCE STATUS: 1 - Symptomatic but completely ambulatory  Physical Exam  Constitutional: Oriented to person, place, and time and well-developed, well-nourished, and in no distress.  HENT:  Head: Normocephalic and atraumatic.  Mouth/Throat: Oropharynx is clear and moist. No oropharyngeal exudate.  Eyes: Conjunctivae are normal. Right eye exhibits no discharge. Left eye exhibits no discharge. No scleral icterus.  Neck: Normal range of motion. Neck supple.  Cardiovascular: Normal rate, regular rhythm, normal heart sounds and intact distal pulses.   Pulmonary/Chest: Effort normal and breath sounds normal. No respiratory distress. No wheezes. No rales.  Abdominal: Soft. Bowel sounds are normal. Exhibits no distension and no mass. There is no tenderness.  Musculoskeletal: Normal range of motion. Exhibits no edema.  Lymphadenopathy:     No cervical adenopathy.  Neurological: Alert and oriented to person, place, and time. Exhibits normal muscle tone. Gait normal. Coordination normal.  Skin: Skin is warm and dry. No rash noted. Not diaphoretic. No erythema. No pallor.  Psychiatric: Mood, memory and judgment normal.  Vitals reviewed.  LABORATORY DATA: Lab Results  Component Value Date   WBC 8.6 11/01/2020   HGB 10.3 (L) 11/01/2020   HCT 33.1 (L) 11/01/2020   MCV 88.3 11/01/2020   PLT 369 11/01/2020      Chemistry      Component Value Date/Time   NA 139 11/01/2020 1059   NA 141 07/20/2012 0959   K 4.7 11/01/2020 1059   K 3.9 07/20/2012 0959   CL 101 11/01/2020 1059   CL 110 (H) 07/20/2012 0959   CO2 25 11/01/2020 1059   CO2 23 07/20/2012 0959   BUN 12 11/01/2020 1059   BUN 16.0 07/20/2012 0959   CREATININE 0.94 11/01/2020  1059   CREATININE 0.8 07/20/2012 0959      Component Value Date/Time   CALCIUM 9.7 11/01/2020 1059   CALCIUM 9.2 07/20/2012 0959   ALKPHOS 64 11/01/2020 1059   ALKPHOS 56 07/20/2012 0959   AST 12 (L) 11/01/2020 1059   AST 15 07/20/2012 0959   ALT 7 11/01/2020 1059   ALT 15 07/20/2012 0959   BILITOT 0.6 11/01/2020 1059   BILITOT 0.50 07/20/2012 0959       RADIOGRAPHIC STUDIES:  No results found.   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  Unfortunately her PET scan showed hypermetabolic right lung mass with hypermetabolic right suprahilar/right hilar/pericardiac lymph node and left adrenal mass. There was also hypermetabolic duodenal wall thickening with hypermetabolic periportal adenopathy and additional focal  areas of hypermetabolism in the proximal stomach and proximal jejunum.These findings are suggestive additional areas of metastatic disease or second malignancy in that area.  She completed palliative radiotherapy to the right lung mass with chest wall invasion under the care of Dr. Sondra Come.  This was completed in April 2022.  The patient recently met with Dr. Tonye Becket at [facility] on 10/27/20 who is going to discuss her case at a multidisciplinary conference on 11/02/20  to review the PET and distribution of sub-diaphragmatic disease, and consider if she would benefit from a biopsy of stomach or adrenal, to ensure this does not represent a 2nd primary GI malignancy. If consistent with 2nd primary, she would consider referral for en bloc resection of right post chest wall mass if feasible, and then appropriate treatment of any GI malignancy. If 2nd primary she mentioned it may also be appropriate to consider genetics eval. However, they mostly discussed if this is felt to be c/w malignant phyllodes tumor by consensus, she would recommend Doxil (or alternatively gemcitabine), balancing her age, comorbidities and histology. She is going to continue to follow with [facility] concurrently.   The patient was seen with Dr. Julien Nordmann today.  Dr. Julien Nordmann had a lengthy discussion today with the patient about her current condition and recommended treatment options.  She was given the option of referral to palliative care/hospice care versus starting on systemic treatment with Doxil 50 mg/m2 IV every 4 weeks with neulasta support.  The patient is interested in treatment with Doxil.  And she is expected to receive her first dose of treatment on 11/09/20  Adverse side effects of treatment were discussed including not limited to cardiac dysfunction, fatigue, nausea, vomiting, kidney, liver dysfunction, alopecia, and myelosuppression.   We will arrange for the patient to have a chemo education class prior to receiving her first  cycle of treatment.  I sent a prescription for EMLA cream to her pharmacy and have reviewed how to take this. I will arrange for a port-a-cath prior to her first infusion. I also sent a refill of her fentanyl patches to the pharmacy.   We will see her back for follow-up visit in 2 weeks for a 1 week follow up visit.    I will arrange for a baseline echocardiogram to be performed.   We will also refer her to GI to evaluate for 2nd primary malignancy.   The patient was advised to  call immediately if she has any concerning symptoms in the interval. The patient voices understanding of current disease status and treatment options and is in agreement with the current care plan. All questions were answered. The patient knows to call the clinic with any problems, questions or concerns. We can certainly see the patient much sooner if necessary     Orders Placed This Encounter  Procedures  . IR IMAGING GUIDED PORT INSERTION    Standing Status:   Future    Standing Expiration Date:   11/01/2021    Order Specific Question:   Reason for Exam (SYMPTOM  OR DIAGNOSIS REQUIRED)    Answer:   Needs for chemotherapy which is expected to start on 11/09/20. please try to arrange before then.    Order Specific Question:   Preferred Imaging Location?    Answer:   [facility]  . ECHOCARDIOGRAM COMPLETE    Standing Status:   Future    Standing Expiration Date:   11/01/2021    Order Specific Question:   Where should this test be performed    Answer:   [place]    Order Specific Question:   Perflutren DEFINITY (image enhancing agent) should be administered unless hypersensitivity or allergy exist    Answer:   Administer Perflutren    Order Specific Question:   Reason for exam-Echo    Answer:   Chemo  [id number]      [place], PA-C 11/01/20  ADDENDUM: Hematology/Oncology Attending: I had a face-to-face encounter with the patient today.  I reviewed her records and recommended her care  plan.  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  The patient presented with hypermetabolic right lung mass with hypermetabolic right suprahilar/right hilar/periprecardiac lymph nodes in addition to left adrenal mass and duodenal wall and gastric wall thickening with periportal adenopathy suspicious for metastatic disease or second primary. She underwent palliative radiotherapy to the right lung mass with chest wall invasion with some improvement of her pain after the radiotherapy.  She is currently on fentanyl patch for pain management.  She also has persistent nausea and she was started on Zyprexa with some improvement but the underlying etiology could be the hypermetabolic process in her stomach.  I would consider referring the patient to gastroenterology for evaluation and to rule out a gastric etiology for her persistent nausea. The patient was seen by Dr. Jeannine Kitten at [place] center for a second opinion regarding her sarcoma.  Dr. Jeannine Kitten recommended for the patient treatment with either Doxil or gemcitabine. The patient is here today for evaluation and discussion of these options in details. I recommended for the patient to proceed with treatment with Doxil 50 mg/M2 every 4 weeks.  I discussed with the patient the adverse effect of this treatment and we will arrange for her to have a chemotherapy education class before the first dose of treatment. She is expected to start the first dose of this treatment next week. The patient will come back for follow-up visit in 2 weeks for evaluation and management of any adverse effect of her treatment. We will arrange for the patient to have 2D echo for evaluation of her cardiac function before starting  the first dose of Doxil. We will refer the patient to IR for consideration  of Port-A-Cath placement. The patient was advised to call immediately if she has any concerning symptoms in the interval. The total time spent in the appointment was 35 minutes.  Disclaimer: This note was dictated with voice recognition software. Similar sounding words can inadvertently be transcribed and may be missed upon review. Eilleen Kempf, MD 11/01/20

## 2020-11-01 ENCOUNTER — Inpatient Hospital Stay (HOSPITAL_BASED_OUTPATIENT_CLINIC_OR_DEPARTMENT_OTHER): Payer: Medicare Other | Admitting: Physician Assistant

## 2020-11-01 ENCOUNTER — Other Ambulatory Visit: Payer: Self-pay | Admitting: Internal Medicine

## 2020-11-01 ENCOUNTER — Other Ambulatory Visit: Payer: Self-pay

## 2020-11-01 ENCOUNTER — Inpatient Hospital Stay: Payer: Medicare Other

## 2020-11-01 VITALS — BP 126/71 | HR 119 | Temp 97.7°F | Resp 17 | Wt 151.7 lb

## 2020-11-01 DIAGNOSIS — R948 Abnormal results of function studies of other organs and systems: Secondary | ICD-10-CM

## 2020-11-01 DIAGNOSIS — C44591 Other specified malignant neoplasm of skin of breast: Secondary | ICD-10-CM | POA: Diagnosis not present

## 2020-11-01 DIAGNOSIS — C50912 Malignant neoplasm of unspecified site of left female breast: Secondary | ICD-10-CM

## 2020-11-01 LAB — CMP (CANCER CENTER ONLY)
ALT: 7 U/L (ref 0–44)
AST: 12 U/L — ABNORMAL LOW (ref 15–41)
Albumin: 2.9 g/dL — ABNORMAL LOW (ref 3.5–5.0)
Alkaline Phosphatase: 64 U/L (ref 38–126)
Anion gap: 13 (ref 5–15)
BUN: 12 mg/dL (ref 8–23)
CO2: 25 mmol/L (ref 22–32)
Calcium: 9.7 mg/dL (ref 8.9–10.3)
Chloride: 101 mmol/L (ref 98–111)
Creatinine: 0.94 mg/dL (ref 0.44–1.00)
GFR, Estimated: >60 mL/min (ref >60)
Glucose, Bld: 218 mg/dL — ABNORMAL HIGH (ref 70–99)
Potassium: 4.7 mmol/L (ref 3.5–5.1)
Sodium: 139 mmol/L (ref 135–145)
Total Bilirubin: 0.6 mg/dL (ref 0.3–1.2)
Total Protein: 7 g/dL (ref 6.5–8.1)

## 2020-11-01 LAB — CBC WITH DIFFERENTIAL (CANCER CENTER ONLY)
Abs Immature Granulocytes: 0.04 10*3/uL (ref 0.00–0.07)
Basophils Absolute: 0 10*3/uL (ref 0.0–0.1)
Basophils Relative: 0 %
Eosinophils Absolute: 0.2 10*3/uL (ref 0.0–0.5)
Eosinophils Relative: 2 %
HCT: 33.1 % — ABNORMAL LOW (ref 36.0–46.0)
Hemoglobin: 10.3 g/dL — ABNORMAL LOW (ref 12.0–15.0)
Immature Granulocytes: 1 %
Lymphocytes Relative: 6 %
Lymphs Abs: 0.6 10*3/uL — ABNORMAL LOW (ref 0.7–4.0)
MCH: 27.5 pg (ref 26.0–34.0)
MCHC: 31.1 g/dL (ref 30.0–36.0)
MCV: 88.3 fL (ref 80.0–100.0)
Monocytes Absolute: 0.6 10*3/uL (ref 0.1–1.0)
Monocytes Relative: 7 %
Neutro Abs: 7.2 10*3/uL (ref 1.7–7.7)
Neutrophils Relative %: 84 %
Platelet Count: 369 10*3/uL (ref 150–400)
RBC: 3.75 MIL/uL — ABNORMAL LOW (ref 3.87–5.11)
RDW: 14.3 % (ref 11.5–15.5)
WBC Count: 8.6 10*3/uL (ref 4.0–10.5)
nRBC: 0 % (ref 0.0–0.2)

## 2020-11-01 MED ORDER — FENTANYL 25 MCG/HR TD PT72: 1.0000 | MEDICATED_PATCH | TRANSDERMAL | 0 refills | Status: AC

## 2020-11-01 MED ORDER — LIDOCAINE-PRILOCAINE 2.5-2.5 % EX CREA: 1.0000 "application " | TOPICAL_CREAM | CUTANEOUS | 2 refills | Status: AC | PRN

## 2020-11-01 NOTE — Patient Instructions (Signed)
Doxorubicin Liposomal injection What is this medicine? LIPOSOMAL DOXORUBICIN (LIP oh som al dox oh ROO bi sin) is a chemotherapy drug. This medicine is used to treat many kinds of cancer like Kaposi's sarcoma, multiple myeloma, and ovarian cancer. This medicine may be used for other purposes; ask your health care provider or pharmacist if you have questions. COMMON BRAND NAME(S): Doxil, Lipodox What should I tell my health care provider before I take this medicine? They need to know if you have any of these conditions:  blood disorders  heart disease  infection (especially a virus infection such as chickenpox, cold sores, or herpes)  liver disease  recent or ongoing radiation therapy  an unusual or allergic reaction to doxorubicin, other chemotherapy agents, soybeans, other medicines, foods, dyes, or preservatives  pregnant or trying to get pregnant  breast-feeding How should I use this medicine? This drug is given as an infusion into a vein. It is administered in a hospital or clinic by a specially trained health care professional. If you have pain, swelling, burning or any unusual feeling around the site of your injection, tell your health care professional right away. Talk to your pediatrician regarding the use of this medicine in children. Special care may be needed. Overdosage: If you think you have taken too much of this medicine contact a poison control center or emergency room at once. NOTE: This medicine is only for you. Do not share this medicine with others. What if I miss a dose? It is important not to miss your dose. Call your doctor or health care professional if you are unable to keep an appointment. What may interact with this medicine? Do not take this medicine with any of the following medications:  zidovudine This medicine may also interact with the following medications:  medicines to increase blood counts like filgrastim, pegfilgrastim,  sargramostim  vaccines Talk to your doctor or health care professional before taking any of these medicines:  acetaminophen  aspirin  ibuprofen  ketoprofen  naproxen This list may not describe all possible interactions. Give your health care provider a list of all the medicines, herbs, non-prescription drugs, or dietary supplements you use. Also tell them if you smoke, drink alcohol, or use illegal drugs. Some items may interact with your medicine. What should I watch for while using this medicine? Your condition will be monitored carefully while you are receiving this medicine. You may need blood work done while you are taking this medicine. This drug may make you feel generally unwell. This is not uncommon, as chemotherapy can affect healthy cells as well as cancer cells. Report any side effects. Continue your course of treatment even though you feel ill unless your doctor tells you to stop. Your urine may turn orange-red for a few days after your dose. This is not blood. If your urine is dark or brown, call your doctor. In some cases, you may be given additional medicines to help with side effects. Follow all directions for their use. Talk to your doctor about your risk of cancer. You may be more at risk for certain types of cancers if you take this medicine. Do not become pregnant while taking this medicine or for 6 months after stopping it. Women should inform their healthcare professional if they wish to become pregnant or think they may be pregnant. Men should not father a child while taking this medicine and for 6 months after stopping it. There is a potential for serious side effects to an unborn child.   Talk to your health care professional or pharmacist for more information. Do not breast-feed an infant while taking this medicine. This medicine has caused ovarian failure in some women. This medicine may make it more difficult to get pregnant. Talk to your healthcare professional if  you are concerned about your fertility. This medicine has caused decreased sperm counts in some men. This may make it more difficult to father a child. Talk to your healthcare professional if you are concerned about your fertility. This medicine may cause a decrease in Co-Enzyme Q-10. You should make sure that you get enough Co-Enzyme Q-10 while you are taking this medicine. Discuss the foods you eat and the vitamins you take with your health care professional. What side effects may I notice from receiving this medicine? Side effects that you should report to your doctor or health care professional as soon as possible:  allergic reactions like skin rash, itching or hives, swelling of the face, lips, or tongue  low blood counts - this medicine may decrease the number of white blood cells, red blood cells and platelets. You may be at increased risk for infections and bleeding.  signs of hand-foot syndrome - tingling or burning, redness, flaking, swelling, small blisters, or small sores on the palms of your hands or the soles of your feet  signs of infection - fever or chills, cough, sore throat, pain or difficulty passing urine  signs of decreased platelets or bleeding - bruising, pinpoint red spots on the skin, black, tarry stools, blood in the urine  signs of decreased red blood cells - unusually weak or tired, fainting spells, lightheadedness  back pain, chills, facial flushing, fever, headache, tightness in the chest or throat during the infusion  breathing problems  chest pain  fast, irregular heartbeat  mouth pain, redness, sores  pain, swelling, redness at site where injected  pain, tingling, numbness in the hands or feet  swelling of ankles, feet, or hands  vomiting Side effects that usually do not require medical attention (report to your doctor or health care professional if they continue or are bothersome):  diarrhea  hair loss  loss of appetite  nail discoloration  or damage  nausea  red or watery eyes  red colored urine  stomach upset This list may not describe all possible side effects. Call your doctor for medical advice about side effects. You may report side effects to FDA at 1-800-FDA-1088. Where should I keep my medicine? This drug is given in a hospital or clinic and will not be stored at home. NOTE: This sheet is a summary. It may not cover all possible information. If you have questions about this medicine, talk to your doctor, pharmacist, or health care provider.  2021 Elsevier/Gold Standard (2018-03-02 15:13:26)  

## 2020-11-01 NOTE — Progress Notes (Signed)
START OFF PATHWAY REGIMEN - Other   [id number]:Liposomal Doxorubicin 50 mg/m2 q28d:   A cycle is every 28 days:     Liposomal doxorubicin   **Always confirm dose/schedule in your pharmacy ordering system**  Patient Characteristics: Intent of Therapy: Non-Curative / Palliative Intent, Discussed with Patient

## 2020-11-02 ENCOUNTER — Other Ambulatory Visit: Payer: Self-pay | Admitting: Radiology

## 2020-11-03 ENCOUNTER — Encounter (HOSPITAL_COMMUNITY): Payer: Self-pay

## 2020-11-03 ENCOUNTER — Other Ambulatory Visit: Payer: Self-pay

## 2020-11-03 ENCOUNTER — Ambulatory Visit (HOSPITAL_COMMUNITY)
Admission: RE | Admit: 2020-11-03 | Discharge: 2020-11-03 | Disposition: A | Discharge status: Home / Self Care | Payer: Medicare Other | Source: Ambulatory Visit | Attending: Physician Assistant | Admitting: Physician Assistant

## 2020-11-03 DIAGNOSIS — Z79899 Other long term (current) drug therapy: Secondary | ICD-10-CM | POA: Diagnosis not present

## 2020-11-03 DIAGNOSIS — Z794 Long term (current) use of insulin: Secondary | ICD-10-CM | POA: Insufficient documentation

## 2020-11-03 DIAGNOSIS — C50912 Malignant neoplasm of unspecified site of left female breast: Secondary | ICD-10-CM | POA: Diagnosis present

## 2020-11-03 DIAGNOSIS — Z7984 Long term (current) use of oral hypoglycemic drugs: Secondary | ICD-10-CM | POA: Insufficient documentation

## 2020-11-03 DIAGNOSIS — C7801 Secondary malignant neoplasm of right lung: Secondary | ICD-10-CM | POA: Insufficient documentation

## 2020-11-03 DIAGNOSIS — Z882 Allergy status to sulfonamides status: Secondary | ICD-10-CM | POA: Insufficient documentation

## 2020-11-03 DIAGNOSIS — Z888 Allergy status to other drugs, medicaments and biological substances status: Secondary | ICD-10-CM | POA: Diagnosis not present

## 2020-11-03 DIAGNOSIS — Z91041 Radiographic dye allergy status: Secondary | ICD-10-CM | POA: Diagnosis not present

## 2020-11-03 HISTORY — PX: IR IMAGING GUIDED PORT INSERTION: IMG5740

## 2020-11-03 MED ORDER — LIDOCAINE HCL (PF) 1 % IJ SOLN
INTRAMUSCULAR | Status: AC | PRN
  Administered 2020-11-03: 10 mL

## 2020-11-03 MED ORDER — HEPARIN SOD (PORK) LOCK FLUSH 100 UNIT/ML IV SOLN
INTRAVENOUS | Status: AC | PRN
  Administered 2020-11-03: 500 [IU] via INTRAVENOUS

## 2020-11-03 MED ORDER — FENTANYL CITRATE (PF) 100 MCG/2ML IJ SOLN
INTRAMUSCULAR | Status: AC
  Filled 2020-11-03: qty 2

## 2020-11-03 MED ORDER — FENTANYL CITRATE (PF) 100 MCG/2ML IJ SOLN
INTRAMUSCULAR | Status: AC | PRN
  Administered 2020-11-03: 50 ug via INTRAVENOUS

## 2020-11-03 MED ORDER — SODIUM CHLORIDE 0.9 % IV SOLN: INTRAVENOUS | Status: DC

## 2020-11-03 MED ORDER — MIDAZOLAM HCL 2 MG/2ML IJ SOLN
INTRAMUSCULAR | Status: AC | PRN
  Administered 2020-11-03: 1 mg via INTRAVENOUS

## 2020-11-03 MED ORDER — LIDOCAINE HCL 1 % IJ SOLN
INTRAMUSCULAR | Status: AC
  Filled 2020-11-03: qty 20

## 2020-11-03 MED ORDER — MIDAZOLAM HCL 2 MG/2ML IJ SOLN
INTRAMUSCULAR | Status: AC
  Filled 2020-11-03: qty 4

## 2020-11-03 MED ORDER — HEPARIN SOD (PORK) LOCK FLUSH 100 UNIT/ML IV SOLN
INTRAVENOUS | Status: AC
  Filled 2020-11-03: qty 5

## 2020-11-03 NOTE — Discharge Instructions (Signed)
Implanted Port Home Guide An implanted port is a device that is placed under the skin. It is usually placed in the chest. The device can be used to give IV medicine, to take blood, or for dialysis. You may have an implanted port if:  You need IV medicine that would be irritating to the small veins in your hands or arms.  You need IV medicines, such as antibiotics, for a long period of time.  You need IV nutrition for a long period of time.  You need dialysis. When you have a port, your health care provider can choose to use the port instead of veins in your arms for these procedures. You may have fewer limitations when using a port than you would if you used other types of long-term IVs, and you will likely be able to return to normal activities after your incision heals. An implanted port has two main parts:  Reservoir. The reservoir is the part where a needle is inserted to give medicines or draw blood. The reservoir is round. After it is placed, it appears as a small, raised area under your skin.  Catheter. The catheter is a thin, flexible tube that connects the reservoir to a vein. Medicine that is inserted into the reservoir goes into the catheter and then into the vein. How is my port accessed? To access your port:  A numbing cream may be placed on the skin over the port site.  Your health care provider will put on a mask and sterile gloves.  The skin over your port will be cleaned carefully with a germ-killing soap and allowed to dry.  Your health care provider will gently pinch the port and insert a needle into it.  Your health care provider will check for a blood return to make sure the port is in the vein and is not clogged.  If your port needs to remain accessed to get medicine continuously (constant infusion), your health care provider will place a clear bandage (dressing) over the needle site. The dressing and needle will need to be changed every week, or as told by your  health care provider. What is flushing? Flushing helps keep the port from getting clogged. Follow instructions from your health care provider about how and when to flush the port. Ports are usually flushed with saline solution or a medicine called heparin. The need for flushing will depend on how the port is used:  If the port is only used from time to time to give medicines or draw blood, the port may need to be flushed: ? Before and after medicines have been given. ? Before and after blood has been drawn. ? As part of routine maintenance. Flushing may be recommended every 4-6 weeks.  If a constant infusion is running, the port may not need to be flushed.  Throw away any syringes in a disposal container that is meant for sharp items (sharps container). You can buy a sharps container from a pharmacy, or you can make one by using an empty hard plastic bottle with a cover. How long will my port stay implanted? The port can stay in for as long as your health care provider thinks it is needed. When it is time for the port to come out, a surgery will be done to remove it. The surgery will be similar to the procedure that was done to put the port in. Follow these instructions at home:  Flush your port as told by your health care   provider.  If you need an infusion over several days, follow instructions from your health care provider about how to take care of your port site. Make sure you: ? Wash your hands with soap and water before you change your dressing. If soap and water are not available, use alcohol-based hand sanitizer. ? Change your dressing as told by your health care provider. ? Place any used dressings or infusion bags into a plastic bag. Throw that bag in the trash. ? Keep the dressing that covers the needle clean and dry. Do not get it wet. ? Do not use scissors or sharp objects near the tube. ? Keep the tube clamped, unless it is being used.  Check your port site every day for signs  of infection. Check for: ? Redness, swelling, or pain. ? Fluid or blood. ? Pus or a bad smell.  Protect the skin around the port site. ? Avoid wearing bra straps that rub or irritate the site. ? Protect the skin around your port from seat belts. Place a soft pad over your chest if needed.  Bathe or shower as told by your health care provider. The site may get wet as long as you are not actively receiving an infusion.  Return to your normal activities as told by your health care provider. Ask your health care provider what activities are safe for you.  Carry a medical alert card or wear a medical alert bracelet at all times. This will let health care providers know that you have an implanted port in case of an emergency.   Get help right away if:  You have redness, swelling, or pain at the port site.  You have fluid or blood coming from your port site.  You have pus or a bad smell coming from the port site.  You have a fever. Summary  Implanted ports are usually placed in the chest for long-term IV access.  Follow instructions from your health care provider about flushing the port and changing bandages (dressings).  Take care of the area around your port by avoiding clothing that puts pressure on the area, and by watching for signs of infection.  Protect the skin around your port from seat belts. Place a soft pad over your chest if needed.  Get help right away if you have a fever or you have redness, swelling, pain, drainage, or a bad smell at the port site. This information is not intended to replace advice given to you by your health care provider. Make sure you discuss any questions you have with your health care provider. Document Revised: 11/08/2019 Document Reviewed: 11/08/2019 Elsevier Patient Education  2021 Elsevier Inc. Moderate Conscious Sedation, Adult, Care After This sheet gives you information about how to care for yourself after your procedure. Your health care  provider may also give you more specific instructions. If you have problems or questions, contact your health care provider. What can I expect after the procedure? After the procedure, it is common to have:  Sleepiness for several hours.  Impaired judgment for several hours.  Difficulty with balance.  Vomiting if you eat too soon. Follow these instructions at home: For the time period you were told by your health care provider:  Rest.  Do not participate in activities where you could fall or become injured.  Do not drive or use machinery.  Do not drink alcohol.  Do not take sleeping pills or medicines that cause drowsiness.  Do not make important decisions or sign   legal documents.  Do not take care of children on your own.      Eating and drinking  Follow the diet recommended by your health care provider.  Drink enough fluid to keep your urine pale yellow.  If you vomit: ? Drink water, juice, or soup when you can drink without vomiting. ? Make sure you have little or no nausea before eating solid foods.   General instructions  Take over-the-counter and prescription medicines only as told by your health care provider.  Have a responsible adult stay with you for the time you are told. It is important to have someone help care for you until you are awake and alert.  Do not smoke.  Keep all follow-up visits as told by your health care provider. This is important. Contact a health care provider if:  You are still sleepy or having trouble with balance after 24 hours.  You feel light-headed.  You keep feeling nauseous or you keep vomiting.  You develop a rash.  You have a fever.  You have redness or swelling around the IV site. Get help right away if:  You have trouble breathing.  You have new-onset confusion at home. Summary  After the procedure, it is common to feel sleepy, have impaired judgment, or feel nauseous if you eat too soon.  Rest after you  get home. Know the things you should not do after the procedure.  Follow the diet recommended by your health care provider and drink enough fluid to keep your urine pale yellow.  Get help right away if you have trouble breathing or new-onset confusion at home. This information is not intended to replace advice given to you by your health care provider. Make sure you discuss any questions you have with your health care provider. Document Revised: 10/22/2019 Document Reviewed: 05/20/2019 Elsevier Patient Education  2021 Elsevier Inc.  

## 2020-11-03 NOTE — H&P (Signed)
Referring Physician(s): Heilingoetter,Cassandra L/Mohamed,M  Supervising Physician: Daryll Brod  Patient Status:  [facility]  Chief Complaint:  "I'm getting a port a cath"  Subjective: Patient familiar to IR service from right lung mass biopsy on 09/25/2020.  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  Metastatic disease was diagnosed in March of this year and presented with right lung mass with right supraclavicular/right hilar/prepericardiac and left adrenal mass.  She also had hypermetabolic duodenal wall thickening and hypermetabolic periportal adenopathy in addition to areas of hypermetabolism in the proximal stomach and proximal jejunum.  She has poor venous access and presents today for Port-A-Cath placement to assist with additional treatment.  She currently denies fever, headache, chest pain, dyspnea, cough, worsening abdominal/back pain, nausea, vomiting or bleeding.  Past Medical History:  Diagnosis Date  . Arthritis   . B12 deficiency anemia   . Diabetes mellitus ORAL AND INSULIN MEDS  . Fibrosis of knee joint RIGHT  . Heart palpitations SINCE 2008 TAKES BETA BLOCKER  . 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 Hyperlipidemia   . Hypertension   . Neuropathy, diabetic ([place]) RIGHT FOOT MILD NUMBNESS  . PONV (postoperative nausea and vomiting)    Past Surgical History:  Procedure Laterality Date  . ABDOMINAL HYSTERECTOMY    . BALLOON DILATION N/A 06/12/2015   Procedure: BALLOON DILATION;  Surgeon: [name], MD;  Location: [facility];  Service: Endoscopy;  Laterality: N/A;  . biopsy of lung    . BREAST LUMPECTOMY  07-27-2007   LEFT   . CARDIAC CATHETERIZATION  2001    MIMINAL PLAQUE RCA, CIRCUMFLEX, LAD;   NORMAL LVF  . CERVICAL FUSION  1996   C3 - 5  . ESOPHAGOGASTRODUODENOSCOPY (EGD) WITH PROPOFOL N/A 06/12/2015   Procedure: ESOPHAGOGASTRODUODENOSCOPY (EGD) WITH PROPOFOL;  Surgeon: [name], MD;  Location: [facility];  Service: Endoscopy;  Laterality: N/A;  . EXCHANGE LEFT BREAST IMPLANT/ CAPSULOTOMY/ RIGHT MASTOPEXY  11-28-2008  . JOINT REPLACEMENT    . KNEE ARTHROSCOPY  1999   RIGHT  . KNEE ARTHROTOMY  04-03-2011   RIGHT KNEE/ SCAR EXCISION/ POLYTHYLENE EXCHANGE  . KNEE CLOSED REDUCTION  07/31/2011   Procedure: CLOSED MANIPULATION KNEE;  Surgeon: [name], MD;  Location: [facility];  Service: Orthopedics;  Laterality: Right;  . LEFT BREAST BX ;  X2  1976  . MANIPULATION KNEE JOINT  04-02-2006   CLOSED --  RIGHT   . TOTAL ABDOMINAL HYSTERECTOMY W/ BILATERAL SALPINGOOPHORECTOMY  1989   AND APPENDECTOMY  . TOTAL KNEE ARTHROPLASTY  02-03-2006   RIGHT  . TOTAL KNEE ARTHROPLASTY Left 10/23/2015   Procedure: TOTAL LEFT KNEE ARTHROPLASTY;  Surgeon: [name], MD;  Location: [facility];  Service: Orthopedics;  Laterality: Left;  . TOTAL LEFT MASTECTOMY  09-29-2007  . TUBAL LIGATION  1976  . URETHRAL DILATION  1976      Allergies: Abaloparatide, Contrast media [iodinated diagnostic agents], Empagliflozin, Epinephrine, Exenatide, Liraglutide, Parathyroid hormone (recomb), and Sulfa antibiotics  Medications: Prior to Admission medications   Medication Sig Start Date End Date Taking? Authorizing Provider  Cholecalciferol 50 MCG (2000 UT) CAPS Take 1 capsule by mouth daily.   Yes [provider]  furosemide (  LASIX) 40 MG tablet Take 20 mg by mouth daily.   Yes [provider]  gabapentin (NEURONTIN) 100 MG capsule Take 1 capsule (100 mg total) by mouth 3 (three) times daily. 09/21/20  Yes Curt Bears, MD  HYDROmorphone (DILAUDID) 4 MG tablet Take 1 tablet (4 mg total) by mouth every 4 (four) hours as  needed for severe pain. 10/05/20  Yes Gery Pray, MD  insulin glargine (LANTUS) 100 UNIT/ML injection Inject 55 Units into the skin every morning.   Yes [provider]  JANUMET 50-500 MG tablet Take 1 tablet by mouth 2 (two) times daily. 04/18/15  Yes [provider]  lisinopril (PRINIVIL,ZESTRIL) 5 MG tablet Take 2.5 mg by mouth daily.    Yes [provider]  LORazepam (ATIVAN) 0.5 MG tablet Take 1 tablet (0.5 mg total) by mouth every 8 (eight) hours. Place sublingual as needed for nausea 10/12/20  Yes Gery Pray, MD  metoprolol succinate (TOPROL-XL) 50 MG 24 hr tablet Take 50 mg by mouth daily. Take with or immediately following a meal.   Yes [provider]  OLANZapine zydis (ZYPREXA) 10 MG disintegrating tablet Take 1 tablet (10 mg total) by mouth at bedtime as needed. For nausea and /or vomiting. 10/21/20  Yes Curt Bears, MD  oxyCODONE-acetaminophen (PERCOCET/ROXICET) 5-325 MG tablet Take 1 tablet by mouth every 6 (six) hours as needed for severe pain. 10/03/20  Yes Curt Bears, MD  potassium chloride SA (KLOR-CON) 20 MEQ tablet Take 1 tablet (20 mEq total) by mouth daily. 10/03/20  Yes Curt Bears, MD  rosuvastatin (CRESTOR) 40 MG tablet Take 10 mg by mouth daily.   Yes [provider]  BD INSULIN SYRINGE U/F 31G X 5/16" 1 ML MISC USE WITH INJECTIONS 10/14/20   [provider]  cetirizine (ZYRTEC) 10 MG tablet Take 10 mg by mouth daily.    [provider]  fentaNYL (DURAGESIC) 25 MCG/HR Place 1 patch onto the skin every 3 (three) days. 11/01/20   Heilingoetter, Cassandra L, PA-C  lidocaine-prilocaine (EMLA) cream Apply 1 application topically as needed. 11/01/20   Heilingoetter, Cassandra L, PA-C  LYLLANA 0.025 MG/24HR Place 1 patch onto the skin 2 (two) times a week. 10/01/20   [provider]  methocarbamol (ROBAXIN) 500 MG tablet Take 1 tablet (500 mg total) by mouth every 6 (six) hours as needed for muscle  spasms. 10/24/15   Constable, Amber, PA-C     Vital Signs: BP 136/63 (BP Location: Right Arm)   Pulse (!) 105   Temp 98.1 F (36.7 C) (Oral)   Resp 16   SpO2 99%   Physical Exam awake, alert.  Chest clear to auscultation bilaterally.  Heart with slightly tachycardic but regular rhythm.  Abdomen soft, positive bowel sounds, nontender.  No lower extremity edema.  Imaging: No results found.  Labs:  CBC: Recent Labs    09/25/20 0942 10/03/20 1256 10/17/20 1259 11/01/20 1059  WBC 8.2 8.7 9.9 8.6  HGB 11.3* 11.0* 11.5* 10.3*  HCT 35.5* 34.6* 36.3 33.1*  PLT 333 355 418* 369    COAGS: Recent Labs    09/25/20 0942  INR 1.1    BMP: Recent Labs    09/18/20 1406 10/03/20 1256 10/17/20 1259 11/01/20 1059  NA 140 143 142 139  K 3.3* 3.1* 3.8 4.7  CL 104 99 100 101  CO2 28 30 30 25   GLUCOSE 235* 115* 95 218*  BUN 14 9 15 12   CALCIUM 9.6 8.9 9.7 9.7  CREATININE  0.97 0.76 0.74 0.94  GFRNONAA 59* >60 >60 >60    LIVER FUNCTION TESTS: Recent Labs    09/18/20 1406 10/03/20 1256 10/17/20 1259 11/01/20 1059  BILITOT 0.5 0.4 0.4 0.6  AST 11* 11* 13* 12*  ALT 9 8 6 7   ALKPHOS 77 69 65 64  PROT 7.1 6.6 6.7 7.0  ALBUMIN 3.4* 3.1* 2.9* 2.9*    Assessment and Plan: Patient familiar to IR service from right lung mass biopsy on 09/25/2020.  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  Metastatic disease was diagnosed in March of this year and presented with right lung mass with right supraclavicular/right hilar/prepericardiac and left adrenal mass.  She also had hypermetabolic duodenal wall thickening and hypermetabolic periportal adenopathy in addition to areas of hypermetabolism in the proximal stomach and proximal jejunum.  She has poor venous access and presents today for Port-A-Cath  placement to assist with additional treatment. Risks and benefits of image guided port-a-catheter placement was discussed with the patient including, but not limited to bleeding, infection, pneumothorax, or fibrin sheath development and need for additional procedures.  All of the patient's questions were answered, patient is agreeable to proceed. Consent signed and in chart.     Electronically Signed: D. Rowe Robert, PA-C 11/03/2020, 11:05 AM   I spent a total of 25 minutes at the the patient's bedside AND on the patient's hospital floor or unit, greater than 50% of which was counseling/coordinating care for Port-A-Cath placement

## 2020-11-03 NOTE — Procedures (Signed)
Interventional Radiology Procedure Note  Procedure: RT IJ POWER PORT    Complications: None  Estimated Blood Loss:  MIN  Findings: TIP SVCRA    M. TREVOR Chaniece Barbato, MD    

## 2020-11-06 ENCOUNTER — Other Ambulatory Visit: Payer: Self-pay

## 2020-11-06 ENCOUNTER — Inpatient Hospital Stay: Payer: Medicare Other | Attending: Internal Medicine

## 2020-11-06 NOTE — Progress Notes (Signed)
The following biosimilar Ziextenzo (pegfilgrastim-bmez) has been selected for use in this patient.  Kennith Center, Pharm.D., CPP 11/06/2020@10 :47 AM

## 2020-11-07 ENCOUNTER — Inpatient Hospital Stay: Payer: Medicare Other

## 2020-11-07 ENCOUNTER — Telehealth: Payer: Self-pay | Admitting: Physician Assistant

## 2020-11-07 NOTE — Telephone Encounter (Signed)
The patient did not show up for her first infusion. I called the patient and she stated she was unaware of her infusion appointment. We will work on getting this rescheduled. She knows to be on the lookout from a call from scheduling.

## 2020-11-08 ENCOUNTER — Other Ambulatory Visit: Payer: Self-pay

## 2020-11-08 ENCOUNTER — Inpatient Hospital Stay (HOSPITAL_COMMUNITY)
Admission: EM | Admit: 2020-11-08 | Discharge: 2020-11-14 | DRG: 844 | Disposition: A | Discharge status: Home / Self Care | Payer: Medicare Other | Attending: Internal Medicine | Admitting: Internal Medicine

## 2020-11-08 ENCOUNTER — Telehealth: Payer: Self-pay | Admitting: Medical Oncology

## 2020-11-08 ENCOUNTER — Encounter (HOSPITAL_COMMUNITY): Payer: Self-pay | Admitting: Emergency Medicine

## 2020-11-08 DIAGNOSIS — I10 Essential (primary) hypertension: Secondary | ICD-10-CM | POA: Diagnosis present

## 2020-11-08 DIAGNOSIS — E876 Hypokalemia: Secondary | ICD-10-CM | POA: Diagnosis not present

## 2020-11-08 DIAGNOSIS — E785 Hyperlipidemia, unspecified: Secondary | ICD-10-CM | POA: Diagnosis present

## 2020-11-08 DIAGNOSIS — C7880 Secondary malignant neoplasm of unspecified digestive organ: Secondary | ICD-10-CM | POA: Diagnosis present

## 2020-11-08 DIAGNOSIS — R112 Nausea with vomiting, unspecified: Secondary | ICD-10-CM

## 2020-11-08 DIAGNOSIS — Z91041 Radiographic dye allergy status: Secondary | ICD-10-CM

## 2020-11-08 DIAGNOSIS — C7989 Secondary malignant neoplasm of other specified sites: Principal | ICD-10-CM | POA: Diagnosis present

## 2020-11-08 DIAGNOSIS — Z20822 Contact with and (suspected) exposure to covid-19: Secondary | ICD-10-CM | POA: Diagnosis present

## 2020-11-08 DIAGNOSIS — Z794 Long term (current) use of insulin: Secondary | ICD-10-CM

## 2020-11-08 DIAGNOSIS — R6884 Jaw pain: Secondary | ICD-10-CM | POA: Diagnosis present

## 2020-11-08 DIAGNOSIS — E11649 Type 2 diabetes mellitus with hypoglycemia without coma: Secondary | ICD-10-CM | POA: Diagnosis present

## 2020-11-08 DIAGNOSIS — Z853 Personal history of malignant neoplasm of breast: Secondary | ICD-10-CM

## 2020-11-08 DIAGNOSIS — Z8249 Family history of ischemic heart disease and other diseases of the circulatory system: Secondary | ICD-10-CM

## 2020-11-08 DIAGNOSIS — E162 Hypoglycemia, unspecified: Secondary | ICD-10-CM

## 2020-11-08 DIAGNOSIS — Z800 Family history of malignant neoplasm of digestive organs: Secondary | ICD-10-CM

## 2020-11-08 DIAGNOSIS — Z888 Allergy status to other drugs, medicaments and biological substances status: Secondary | ICD-10-CM

## 2020-11-08 DIAGNOSIS — Z882 Allergy status to sulfonamides status: Secondary | ICD-10-CM

## 2020-11-08 DIAGNOSIS — E860 Dehydration: Secondary | ICD-10-CM

## 2020-11-08 DIAGNOSIS — E871 Hypo-osmolality and hyponatremia: Secondary | ICD-10-CM | POA: Diagnosis not present

## 2020-11-08 DIAGNOSIS — R2242 Localized swelling, mass and lump, left lower limb: Secondary | ICD-10-CM

## 2020-11-08 DIAGNOSIS — Z9012 Acquired absence of left breast and nipple: Secondary | ICD-10-CM

## 2020-11-08 DIAGNOSIS — Z79899 Other long term (current) drug therapy: Secondary | ICD-10-CM

## 2020-11-08 DIAGNOSIS — Z96653 Presence of artificial knee joint, bilateral: Secondary | ICD-10-CM | POA: Diagnosis present

## 2020-11-08 DIAGNOSIS — Z923 Personal history of irradiation: Secondary | ICD-10-CM

## 2020-11-08 DIAGNOSIS — E1165 Type 2 diabetes mellitus with hyperglycemia: Secondary | ICD-10-CM | POA: Diagnosis present

## 2020-11-08 DIAGNOSIS — E873 Alkalosis: Secondary | ICD-10-CM | POA: Diagnosis present

## 2020-11-08 DIAGNOSIS — C50919 Malignant neoplasm of unspecified site of unspecified female breast: Secondary | ICD-10-CM | POA: Diagnosis present

## 2020-11-08 DIAGNOSIS — E1140 Type 2 diabetes mellitus with diabetic neuropathy, unspecified: Secondary | ICD-10-CM | POA: Diagnosis present

## 2020-11-08 DIAGNOSIS — D630 Anemia in neoplastic disease: Secondary | ICD-10-CM | POA: Diagnosis present

## 2020-11-08 LAB — COMPREHENSIVE METABOLIC PANEL
ALT: 12 U/L (ref 0–44)
AST: 13 U/L — ABNORMAL LOW (ref 15–41)
Albumin: 2.8 g/dL — ABNORMAL LOW (ref 3.5–5.0)
Alkaline Phosphatase: 53 U/L (ref 38–126)
Anion gap: 10 (ref 5–15)
BUN: 12 mg/dL (ref 8–23)
CO2: 27 mmol/L (ref 22–32)
Calcium: 9.4 mg/dL (ref 8.9–10.3)
Chloride: 101 mmol/L (ref 98–111)
Creatinine, Ser: 0.85 mg/dL (ref 0.44–1.00)
GFR, Estimated: >60 mL/min (ref >60)
Glucose, Bld: 67 mg/dL — ABNORMAL LOW (ref 70–99)
Potassium: 3.6 mmol/L (ref 3.5–5.1)
Sodium: 138 mmol/L (ref 135–145)
Total Bilirubin: 0.4 mg/dL (ref 0.3–1.2)
Total Protein: 6.4 g/dL — ABNORMAL LOW (ref 6.5–8.1)

## 2020-11-08 LAB — CBC WITH DIFFERENTIAL/PLATELET
Abs Immature Granulocytes: 0.04 10*3/uL (ref 0.00–0.07)
Basophils Absolute: 0 10*3/uL (ref 0.0–0.1)
Basophils Relative: 0 %
Eosinophils Absolute: 0.2 10*3/uL (ref 0.0–0.5)
Eosinophils Relative: 2 %
HCT: 29.5 % — ABNORMAL LOW (ref 36.0–46.0)
Hemoglobin: 9.1 g/dL — ABNORMAL LOW (ref 12.0–15.0)
Immature Granulocytes: 1 %
Lymphocytes Relative: 13 %
Lymphs Abs: 1.1 10*3/uL (ref 0.7–4.0)
MCH: 26.8 pg (ref 26.0–34.0)
MCHC: 30.8 g/dL (ref 30.0–36.0)
MCV: 87 fL (ref 80.0–100.0)
Monocytes Absolute: 0.9 10*3/uL (ref 0.1–1.0)
Monocytes Relative: 11 %
Neutro Abs: 6.1 10*3/uL (ref 1.7–7.7)
Neutrophils Relative %: 73 %
Platelets: 344 10*3/uL (ref 150–400)
RBC: 3.39 MIL/uL — ABNORMAL LOW (ref 3.87–5.11)
RDW: 14.5 % (ref 11.5–15.5)
WBC: 8.3 10*3/uL (ref 4.0–10.5)
nRBC: 0 % (ref 0.0–0.2)

## 2020-11-08 LAB — CBG MONITORING, ED
Glucose-Capillary: 42 mg/dL — CL (ref 70–99)
Glucose-Capillary: 105 mg/dL — ABNORMAL HIGH (ref 70–99)

## 2020-11-08 MED ORDER — LACTATED RINGERS IV BOLUS
1000.0000 mL | Freq: Once | INTRAVENOUS | Status: AC
  Administered 2020-11-08: 1000 mL via INTRAVENOUS

## 2020-11-08 MED ORDER — DEXTROSE 50 % IV SOLN
1.0000 | Freq: Once | INTRAVENOUS | Status: AC
  Administered 2020-11-08: 50 mL via INTRAVENOUS
  Filled 2020-11-08: qty 50

## 2020-11-08 MED ORDER — ONDANSETRON HCL 4 MG/2ML IJ SOLN
4.0000 mg | Freq: Once | INTRAMUSCULAR | Status: AC
  Administered 2020-11-08: 4 mg via INTRAVENOUS
  Filled 2020-11-08: qty 2

## 2020-11-08 NOTE — ED Triage Notes (Signed)
Pt BIB EMS from home c/o dehydration. Recently dx with lung cancer. Hx of breast cancer. Endorses N/V. Not able to tolerate PO intake x 4-5 days.

## 2020-11-08 NOTE — Telephone Encounter (Signed)
Weak ,vomiting/found a knot "the size of a baseball" behind her L thigh.   .Pt is not doing well. " I need help, I am weak and  vomiting again . It happened today 2 hours after I ate an egg. I think I need I need glucose". I called her friend Forensic psychologist and she will go to pts house to watch pts invalid husband.  Dtr contacted and is on her way -It takes her 30 minutes. I instructed pt to call EMS.She said she would .  She is scheduled to go to [facility] on Friday.

## 2020-11-08 NOTE — ED Provider Notes (Signed)
I provided a substantive portion of the care of this patient.  I personally performed the entirety of the medical decision making for this encounter.    80 year old female who presents with vomiting or diarrhea.  Currently is a cancer patient concern for dehydration.  Will IV hydrate here and likely admit   Lacretia Leigh, MD 11/08/20 [phone]

## 2020-11-08 NOTE — ED Provider Notes (Signed)
[facility] DEPT Provider Note   CSN: [id number] Arrival date & time: 11/08/20  1641     History Chief Complaint  Patient presents with  . Emesis  . Dehydration    [name] is a 80 y.o. female with history of metastatic breast cancer, presenting for evaluation of nausea and vomiting.  She states she began vomiting about 3 weeks ago and is gotten progressively worse.  She has not been able to manage her symptoms, was prescribed Ativan and Zyprexa without any relief.  Is unable to tell me any other additional medications she was given prior to the ativan and zyprexa, but states she has tried numerous medications. She states she has been having difficulty keeping any food or drink down.  She started to feel more weak.  Denies any bleeding. Denies abdominal pain, urinary sx, fever, URI sx.   Regarding her cancer treatment, she has received radiation therapy but not yet started chemotherapy. States Dr. Earlie Server recommended she come for treatment and likely need admission.    The history is provided by the patient.       Past Medical History:  Diagnosis Date  . Arthritis   . B12 deficiency anemia   . Diabetes mellitus ORAL AND INSULIN MEDS  . Fibrosis of knee joint RIGHT  . Heart palpitations SINCE 2008 TAKES BETA BLOCKER  . 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 Hyperlipidemia   . Hypertension   . 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 PONV (postoperative nausea and vomiting)     Patient Active Problem List   Diagnosis Date Noted  . Intractable nausea and vomiting 11/09/2020  . Hypoglycemia 11/09/2020  . Controlled type 2 diabetes mellitus with hypoglycemia ([place]) 11/09/2020  . Essential hypertension 11/09/2020  . Mass of left thigh 11/09/2020  . Nausea and vomiting 10/17/2020  . Cancer associated pain 10/17/2020  .  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 OA (osteoarthritis) of knee 10/23/2015  . Pre-operative cardiovascular examination 08/24/2015  . 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 ABDOMINAL HYSTERECTOMY    . BALLOON DILATION N/A 06/12/2015   Procedure: BALLOON DILATION;  Surgeon: [name], MD;  Location: [facility];  Service: Endoscopy;  Laterality: N/A;  . biopsy of lung    . BREAST LUMPECTOMY  07-27-2007   LEFT   . CARDIAC CATHETERIZATION  2001   MIMINAL PLAQUE RCA, CIRCUMFLEX, LAD;   NORMAL LVF  . CERVICAL FUSION  1996   C3 - 5  . ESOPHAGOGASTRODUODENOSCOPY (EGD) WITH PROPOFOL N/A 06/12/2015   Procedure: ESOPHAGOGASTRODUODENOSCOPY (EGD) WITH PROPOFOL;  Surgeon: [name], MD;  Location: [facility];  Service: Endoscopy;  Laterality: N/A;  . EXCHANGE LEFT BREAST IMPLANT/ CAPSULOTOMY/ RIGHT MASTOPEXY  11-28-2008  . IR IMAGING GUIDED PORT INSERTION  11/03/2020  . JOINT REPLACEMENT    . KNEE ARTHROSCOPY  1999   RIGHT  . KNEE ARTHROTOMY  04-03-2011   RIGHT KNEE/ SCAR EXCISION/ POLYTHYLENE EXCHANGE  . KNEE CLOSED REDUCTION  07/31/2011   Procedure: CLOSED MANIPULATION KNEE;  Surgeon: [name], MD;  Location: [facility];  Service: Orthopedics;  Laterality: Right;  . LEFT BREAST BX ;  X2  1976  . MANIPULATION KNEE JOINT  04-02-2006   CLOSED --  RIGHT   .  TOTAL ABDOMINAL HYSTERECTOMY W/ BILATERAL SALPINGOOPHORECTOMY  1989   AND APPENDECTOMY  . TOTAL KNEE ARTHROPLASTY  02-03-2006   RIGHT  . TOTAL KNEE ARTHROPLASTY Left 10/23/2015   Procedure: TOTAL LEFT KNEE ARTHROPLASTY;  Surgeon: [name], MD;  Location: [facility];  Service: Orthopedics;  Laterality: Left;  . TOTAL LEFT MASTECTOMY  09-29-2007  . TUBAL LIGATION  1976  . URETHRAL DILATION  1976     OB History   No obstetric history on file.     Family History  Problem Relation Age of Onset  . Heart disease  Mother   . Stroke Father   . Cancer Sister        stomach  . Cancer Brother        colon  . Breast cancer Neg Hx     Social History   Tobacco Use  . Smoking status: Never Smoker  . Smokeless tobacco: Never Used  Vaping Use  . Vaping Use: Never used  Substance Use Topics  . Alcohol use: No  . Drug use: No    Home Medications Prior to Admission medications   Medication Sig Start Date End Date Taking? Authorizing Provider  BD INSULIN SYRINGE U/F 31G X 5/16" 1 ML MISC USE WITH INJECTIONS 10/14/20  Yes [provider]  cetirizine (ZYRTEC) 10 MG tablet Take 10 mg by mouth daily.   Yes [provider]  Cholecalciferol 50 MCG (2000 UT) CAPS Take 1 capsule by mouth daily.   Yes [provider]  furosemide (LASIX) 40 MG tablet Take 20 mg by mouth daily.   Yes [provider]  gabapentin (NEURONTIN) 100 MG capsule Take 1 capsule (100 mg total) by mouth 3 (three) times daily. 09/21/20  Yes Curt Bears, MD  HYDROmorphone (DILAUDID) 4 MG tablet Take 1 tablet (4 mg total) by mouth every 4 (four) hours as needed for severe pain. 10/05/20  Yes Gery Pray, MD  insulin glargine (LANTUS) 100 UNIT/ML injection Inject 55 Units into the skin every morning.   Yes [provider]  JANUMET 50-500 MG tablet Take 1 tablet by mouth 2 (two) times daily. 04/18/15  Yes [provider]  lisinopril (PRINIVIL,ZESTRIL) 5 MG tablet Take 2.5 mg by mouth daily.    Yes [provider]  LORazepam (ATIVAN) 0.5 MG tablet Take 1 tablet (0.5 mg total) by mouth every 8 (eight) hours. Place sublingual as needed for nausea 10/12/20  Yes Gery Pray, MD  LYLLANA 0.025 MG/24HR Place 1 patch onto the skin 2 (two) times a week. 10/01/20  Yes [provider]  metoprolol succinate (TOPROL-XL) 50 MG 24 hr tablet Take 50 mg by mouth daily. Take with or immediately following a meal.   Yes [provider]  OLANZapine zydis (ZYPREXA) 10 MG disintegrating  tablet Take 1 tablet (10 mg total) by mouth at bedtime as needed. For nausea and /or vomiting. 10/21/20  Yes Curt Bears, MD  potassium chloride SA (KLOR-CON) 20 MEQ tablet Take 1 tablet (20 mEq total) by mouth daily. 10/03/20  Yes Curt Bears, MD  rosuvastatin (CRESTOR) 40 MG tablet Take 10 mg by mouth daily.   Yes [provider]  fentaNYL (DURAGESIC) 25 MCG/HR Place 1 patch onto the skin every 3 (three) days. 11/01/20   Heilingoetter, Cassandra L, PA-C  lidocaine-prilocaine (EMLA) cream Apply 1 application topically as needed. 11/01/20   Heilingoetter, Cassandra L, PA-C  methocarbamol (ROBAXIN) 500 MG tablet Take 1 tablet (500 mg total) by mouth every 6 (six) hours as  needed for muscle spasms. Patient not taking: Reported on 11/09/2020 10/24/15   Ardeen Jourdain, PA-C  oxyCODONE-acetaminophen (PERCOCET/ROXICET) 5-325 MG tablet Take 1 tablet by mouth every 6 (six) hours as needed for severe pain. 10/03/20   Curt Bears, MD    Allergies    Abaloparatide, Contrast media [iodinated diagnostic agents], Empagliflozin, Epinephrine, Exenatide, Liraglutide, Parathyroid hormone (recomb), and Sulfa antibiotics  Review of Systems   Review of Systems  All other systems reviewed and are negative.   Physical Exam Updated Vital Signs BP 126/66 (BP Location: Right Arm)   Pulse 98   Temp 98 F (36.7 C) (Oral)   Resp 17   Ht 5\' 7"  (1.702 m)   Wt 63.5 kg   SpO2 98%   BMI 21.93 kg/m   Physical Exam Vitals and nursing note reviewed.  Constitutional:      Appearance: She is well-developed.  HENT:     Head: Normocephalic and atraumatic.  Eyes:     Conjunctiva/sclera: Conjunctivae normal.  Cardiovascular:     Rate and Rhythm: Regular rhythm. Tachycardia present.  Pulmonary:     Effort: Pulmonary effort is normal. No respiratory distress.     Breath sounds: Normal breath sounds.  Abdominal:     General: Bowel sounds are normal.     Palpations: Abdomen is soft. There is mass  (RUQ (patient states she will have it biopsied at [facility])).     Tenderness: There is no abdominal tenderness.  Skin:    General: Skin is warm.  Neurological:     Mental Status: She is alert.  Psychiatric:        Behavior: Behavior normal.     ED Results / Procedures / Treatments   Labs (all labs ordered are listed, but only abnormal results are displayed) Labs Reviewed  COMPREHENSIVE METABOLIC PANEL - Abnormal; Notable for the following components:      Result Value   Glucose, Bld 67 (*)    Total Protein 6.4 (*)    Albumin 2.8 (*)    AST 13 (*)    All other components within normal limits  CBC WITH DIFFERENTIAL/PLATELET - Abnormal; Notable for the following components:   RBC 3.39 (*)    Hemoglobin 9.1 (*)    HCT 29.5 (*)    All other components within normal limits  BASIC METABOLIC PANEL - Abnormal; Notable for the following components:   Potassium 3.4 (*)    Glucose, Bld 67 (*)    All other components within normal limits  CBC - Abnormal; Notable for the following components:   RBC 3.11 (*)    Hemoglobin 8.4 (*)    HCT 27.2 (*)    All other components within normal limits  CBG MONITORING, ED - Abnormal; Notable for the following components:   Glucose-Capillary 42 (*)    All other components within normal limits  CBG MONITORING, ED - Abnormal; Notable for the following components:   Glucose-Capillary 105 (*)    All other components within normal limits  CBG MONITORING, ED - Abnormal; Notable for the following components:   Glucose-Capillary 53 (*)    All other components within normal limits  RESP PANEL BY RT-PCR (FLU A&B, COVID) ARPGX2  URINALYSIS, ROUTINE W REFLEX MICROSCOPIC    EKG None  Radiology [place] LT LOWER EXTREM LTD SOFT TISSUE NON VASCULAR  Result Date: 11/09/2020 CLINICAL DATA:  Left thigh mass EXAM: ULTRASOUND LEFT LOWER EXTREMITY LIMITED TECHNIQUE: Ultrasound examination of the lower extremity soft tissues was performed in the area  of clinical concern.  COMPARISON:  None. FINDINGS: There is a complex, thick walled mass with central debris measuring 6.3 x 4.5 x 5.5 cm IMPRESSION: Complex, thick-walled mass of the left thigh, measuring up to 6.3 cm. CT would be helpful for further characterization. This may be a necrotic mass or an abscess. Electronically Signed   By: Ulyses Jarred M.D.   On: 11/09/2020 01:45    Procedures Procedures   Medications Ordered in ED Medications                            lactated ringers bolus 1,000 mL (0 mLs Intravenous Stopped 11/08/20 1930)  ondansetron (ZOFRAN) injection 4 mg (4 mg Intravenous Given 11/08/20 1850)  dextrose 50 % solution 50 mL (50 mLs Intravenous Given 11/08/20 2018)    ED Course  I have reviewed the triage vital signs and the nursing notes.  Pertinent labs & imaging results that were available during my care of the patient were reviewed by me and considered in my medical decision making (see chart for details).    MDM Rules/Calculators/A&P                          Patient with history of metastatic breast cancer, presenting for dehydration, intractable nausea vomiting over the last few weeks.  Has received radiation therapy, has not yet started chemotherapy.  Has tried many antiemetics without relief, has now been taking Ativan and Zyprexa and still is having vomiting with any p.o. intake.  She is tachycardic here, she is given IVF, antiemetics. She is also noted to have severe hypoglycemia at 42, this is replaced with d50 with some improvement.    Patient will benefit from admission for intractable nausea vomiting at home with  p.o. intake, dehydration, hypoglycemia.   Final Clinical Impression(s) / ED Diagnoses Final diagnoses:  Intractable nausea and vomiting  Dehydration  Hypoglycemia    Rx / DC Orders ED Discharge Orders    None       Keonta Monceaux, [place] N, PA-C 11/09/20 1602    Lacretia Leigh, MD 11/13/20 1334

## 2020-11-09 ENCOUNTER — Other Ambulatory Visit: Payer: Self-pay

## 2020-11-09 ENCOUNTER — Ambulatory Visit: Payer: Medicare Other

## 2020-11-09 ENCOUNTER — Encounter (HOSPITAL_COMMUNITY): Payer: Self-pay | Admitting: Family Medicine

## 2020-11-09 ENCOUNTER — Telehealth: Payer: Self-pay | Admitting: Medical Oncology

## 2020-11-09 ENCOUNTER — Inpatient Hospital Stay (HOSPITAL_COMMUNITY): Payer: Medicare Other

## 2020-11-09 DIAGNOSIS — Z882 Allergy status to sulfonamides status: Secondary | ICD-10-CM | POA: Diagnosis not present

## 2020-11-09 DIAGNOSIS — D630 Anemia in neoplastic disease: Secondary | ICD-10-CM | POA: Diagnosis present

## 2020-11-09 DIAGNOSIS — C7989 Secondary malignant neoplasm of other specified sites: Secondary | ICD-10-CM | POA: Diagnosis present

## 2020-11-09 DIAGNOSIS — E873 Alkalosis: Secondary | ICD-10-CM | POA: Diagnosis present

## 2020-11-09 DIAGNOSIS — I10 Essential (primary) hypertension: Secondary | ICD-10-CM | POA: Diagnosis present

## 2020-11-09 DIAGNOSIS — E871 Hypo-osmolality and hyponatremia: Secondary | ICD-10-CM | POA: Diagnosis not present

## 2020-11-09 DIAGNOSIS — E11649 Type 2 diabetes mellitus with hypoglycemia without coma: Secondary | ICD-10-CM

## 2020-11-09 DIAGNOSIS — R6884 Jaw pain: Secondary | ICD-10-CM | POA: Diagnosis present

## 2020-11-09 DIAGNOSIS — R2242 Localized swelling, mass and lump, left lower limb: Secondary | ICD-10-CM | POA: Diagnosis not present

## 2020-11-09 DIAGNOSIS — Z800 Family history of malignant neoplasm of digestive organs: Secondary | ICD-10-CM | POA: Diagnosis not present

## 2020-11-09 DIAGNOSIS — Z794 Long term (current) use of insulin: Secondary | ICD-10-CM

## 2020-11-09 DIAGNOSIS — R112 Nausea with vomiting, unspecified: Secondary | ICD-10-CM | POA: Diagnosis present

## 2020-11-09 DIAGNOSIS — Z96653 Presence of artificial knee joint, bilateral: Secondary | ICD-10-CM | POA: Diagnosis present

## 2020-11-09 DIAGNOSIS — Z8249 Family history of ischemic heart disease and other diseases of the circulatory system: Secondary | ICD-10-CM | POA: Diagnosis not present

## 2020-11-09 DIAGNOSIS — Z91041 Radiographic dye allergy status: Secondary | ICD-10-CM | POA: Diagnosis not present

## 2020-11-09 DIAGNOSIS — E1140 Type 2 diabetes mellitus with diabetic neuropathy, unspecified: Secondary | ICD-10-CM | POA: Diagnosis present

## 2020-11-09 DIAGNOSIS — C7880 Secondary malignant neoplasm of unspecified digestive organ: Secondary | ICD-10-CM | POA: Diagnosis present

## 2020-11-09 DIAGNOSIS — E162 Hypoglycemia, unspecified: Secondary | ICD-10-CM | POA: Insufficient documentation

## 2020-11-09 DIAGNOSIS — E785 Hyperlipidemia, unspecified: Secondary | ICD-10-CM | POA: Diagnosis present

## 2020-11-09 DIAGNOSIS — Z9012 Acquired absence of left breast and nipple: Secondary | ICD-10-CM | POA: Diagnosis not present

## 2020-11-09 DIAGNOSIS — C50912 Malignant neoplasm of unspecified site of left female breast: Secondary | ICD-10-CM | POA: Diagnosis not present

## 2020-11-09 DIAGNOSIS — Z853 Personal history of malignant neoplasm of breast: Secondary | ICD-10-CM | POA: Diagnosis not present

## 2020-11-09 DIAGNOSIS — E1165 Type 2 diabetes mellitus with hyperglycemia: Secondary | ICD-10-CM | POA: Diagnosis present

## 2020-11-09 DIAGNOSIS — Z20822 Contact with and (suspected) exposure to covid-19: Secondary | ICD-10-CM | POA: Diagnosis present

## 2020-11-09 DIAGNOSIS — E876 Hypokalemia: Secondary | ICD-10-CM | POA: Diagnosis not present

## 2020-11-09 DIAGNOSIS — Z79899 Other long term (current) drug therapy: Secondary | ICD-10-CM | POA: Diagnosis not present

## 2020-11-09 DIAGNOSIS — Z888 Allergy status to other drugs, medicaments and biological substances status: Secondary | ICD-10-CM | POA: Diagnosis not present

## 2020-11-09 DIAGNOSIS — Z923 Personal history of irradiation: Secondary | ICD-10-CM | POA: Diagnosis not present

## 2020-11-09 LAB — CBC
HCT: 27.2 % — ABNORMAL LOW (ref 36.0–46.0)
Hemoglobin: 8.4 g/dL — ABNORMAL LOW (ref 12.0–15.0)
MCH: 27 pg (ref 26.0–34.0)
MCHC: 30.9 g/dL (ref 30.0–36.0)
MCV: 87.5 fL (ref 80.0–100.0)
Platelets: 297 10*3/uL (ref 150–400)
RBC: 3.11 MIL/uL — ABNORMAL LOW (ref 3.87–5.11)
RDW: 14.5 % (ref 11.5–15.5)
WBC: 6.4 10*3/uL (ref 4.0–10.5)
nRBC: 0 % (ref 0.0–0.2)

## 2020-11-09 LAB — URINALYSIS, ROUTINE W REFLEX MICROSCOPIC
Bilirubin Urine: NEGATIVE
Glucose, UA: NEGATIVE mg/dL
Hgb urine dipstick: NEGATIVE
Ketones, ur: NEGATIVE mg/dL
Leukocytes,Ua: NEGATIVE
Nitrite: NEGATIVE
Protein, ur: NEGATIVE mg/dL
Specific Gravity, Urine: 1.008 (ref 1.005–1.030)
pH: 5 (ref 5.0–8.0)

## 2020-11-09 LAB — BASIC METABOLIC PANEL
Anion gap: 9 (ref 5–15)
BUN: 10 mg/dL (ref 8–23)
CO2: 30 mmol/L (ref 22–32)
Calcium: 9.4 mg/dL (ref 8.9–10.3)
Chloride: 106 mmol/L (ref 98–111)
Creatinine, Ser: 0.72 mg/dL (ref 0.44–1.00)
GFR, Estimated: >60 mL/min (ref >60)
Glucose, Bld: 67 mg/dL — ABNORMAL LOW (ref 70–99)
Potassium: 3.4 mmol/L — ABNORMAL LOW (ref 3.5–5.1)
Sodium: 145 mmol/L (ref 135–145)

## 2020-11-09 LAB — CBG MONITORING, ED: Glucose-Capillary: 53 mg/dL — ABNORMAL LOW (ref 70–99)

## 2020-11-09 LAB — RESP PANEL BY RT-PCR (FLU A&B, COVID) ARPGX2
Influenza A by PCR: NEGATIVE
Influenza B by PCR: NEGATIVE
SARS Coronavirus 2 by RT PCR: NEGATIVE

## 2020-11-09 LAB — GLUCOSE, CAPILLARY: Glucose-Capillary: 88 mg/dL (ref 70–99)

## 2020-11-09 MED ORDER — METOPROLOL SUCCINATE ER 50 MG PO TB24
50.0000 mg | ORAL_TABLET | Freq: Every day | ORAL | Status: DC
  Administered 2020-11-09 – 2020-11-14 (×6): 50 mg via ORAL
  Filled 2020-11-09 (×6): qty 1

## 2020-11-09 MED ORDER — ROSUVASTATIN CALCIUM 10 MG PO TABS
10.0000 mg | ORAL_TABLET | Freq: Every day | ORAL | Status: DC
  Administered 2020-11-09 – 2020-11-14 (×6): 10 mg via ORAL
  Filled 2020-11-09 (×7): qty 1

## 2020-11-09 MED ORDER — CHLORHEXIDINE GLUCONATE CLOTH 2 % EX PADS
6.0000 | MEDICATED_PAD | Freq: Every day | CUTANEOUS | Status: DC
  Administered 2020-11-09 – 2020-11-14 (×6): 6 via TOPICAL

## 2020-11-09 MED ORDER — POTASSIUM CHLORIDE CRYS ER 20 MEQ PO TBCR
20.0000 meq | EXTENDED_RELEASE_TABLET | Freq: Every day | ORAL | Status: DC
  Administered 2020-11-09: 20 meq via ORAL
  Filled 2020-11-09: qty 1

## 2020-11-09 MED ORDER — ONDANSETRON HCL 4 MG PO TABS: 4.0000 mg | ORAL_TABLET | Freq: Four times a day (QID) | ORAL | Status: DC | PRN

## 2020-11-09 MED ORDER — PANTOPRAZOLE SODIUM 40 MG IV SOLR
40.0000 mg | INTRAVENOUS | Status: DC
  Administered 2020-11-09: 40 mg via INTRAVENOUS
  Filled 2020-11-09: qty 40

## 2020-11-09 MED ORDER — LACTATED RINGERS IV SOLN: INTRAVENOUS | Status: DC

## 2020-11-09 MED ORDER — ACETAMINOPHEN 325 MG PO TABS
650.0000 mg | ORAL_TABLET | Freq: Four times a day (QID) | ORAL | Status: DC | PRN
  Administered 2020-11-09 – 2020-11-12 (×8): 650 mg via ORAL
  Filled 2020-11-09 (×9): qty 2

## 2020-11-09 MED ORDER — SODIUM CHLORIDE 0.9 % IV SOLN
8.0000 mg | Freq: Three times a day (TID) | INTRAVENOUS | Status: DC | PRN
  Filled 2020-11-09: qty 4

## 2020-11-09 MED ORDER — LISINOPRIL 5 MG PO TABS
2.5000 mg | ORAL_TABLET | Freq: Every day | ORAL | Status: DC
  Administered 2020-11-10 – 2020-11-14 (×5): 2.5 mg via ORAL
  Filled 2020-11-09 (×7): qty 1

## 2020-11-09 MED ORDER — DOXYLAMINE SUCCINATE (SLEEP) 25 MG PO TABS
25.0000 mg | ORAL_TABLET | Freq: Every evening | ORAL | Status: DC | PRN
  Administered 2020-11-09: 25 mg via ORAL
  Filled 2020-11-09 (×2): qty 1

## 2020-11-09 MED ORDER — KCL IN DEXTROSE-NACL 40-5-0.45 MEQ/L-%-% IV SOLN
INTRAVENOUS | Status: DC
  Filled 2020-11-09 (×4): qty 1000

## 2020-11-09 MED ORDER — ENOXAPARIN SODIUM 40 MG/0.4ML IJ SOSY
40.0000 mg | PREFILLED_SYRINGE | INTRAMUSCULAR | Status: DC
  Administered 2020-11-09 – 2020-11-12 (×4): 40 mg via SUBCUTANEOUS
  Filled 2020-11-09 (×4): qty 0.4

## 2020-11-09 MED ORDER — ACETAMINOPHEN 650 MG RE SUPP: 650.0000 mg | Freq: Four times a day (QID) | RECTAL | Status: DC | PRN

## 2020-11-09 NOTE — ED Notes (Signed)
Provided pt with 8oz orange juice for CBG 53

## 2020-11-09 NOTE — Telephone Encounter (Signed)
[name] updated on pt and he is on his way to see his mother.   Biopsy rescheduled to May 14 at [facility].

## 2020-11-09 NOTE — H&P (Signed)
History and Physical    [name] CLE:[id number] DOB: [birth year] DOA: 11/08/2020  PCP: Ann Huddle, MD   Patient coming from:   Home  Chief Complaint: Nausea and vomiting  HPI: [name] is a 80 y.o. female with medical history significant for DMT2, HTN, metastatic breast cancer who presents for evaluation of tractable nausea and vomiting.  She reports that for the last 3 weeks she has been having progressively worsening nausea and vomiting.  She has been seen by her doctors for the symptoms and has been given multiple antiemetics which were not helping.  She was placed on Ativan and Zyprexa for the nausea and vomiting last week but continues to have symptoms and is not able to tolerate p.o. intake.  She states she has lost 20 pounds over the last month due to not being able to eat.  She reports she has been feeling weak and gets dizzy when she stands up for the last few days.  She has not had any syncope.  She does have a history of back pain secondary to the metastatic cancer but is improved since she underwent radiation treatments.  Monitor taking pain medication she reports.  She reports pain medications contributed to her nausea initially but for the last few weeks she has not been taking any.  ED Course: Patient is remained hemodynamically stable.  Initially found to have blood sugar of 67.  Blood sugars improved with IV fluid hydration.  She has been given antiemetics but continues to have nausea and vomiting and is to tolerate p.o. intake.  Hospitalist service has been asked to admit for further management  Review of Systems:  General: Denies weakness, fever, chills, weight loss, night sweats.  Denies dizziness.  Denies change in appetite HENT: Denies head trauma, headache, denies change in hearing, tinnitus.  Denies nasal congestion or bleeding.  Denies sore throat, sores in mouth.  Denies difficulty swallowing Eyes: Denies blurry vision, pain in eye, drainage.  Denies  discoloration of eyes. Neck: Denies pain.  Denies swelling.  Denies pain with movement. Cardiovascular: Denies chest pain, palpitations.  Denies edema.  Denies orthopnea Respiratory: Denies shortness of breath, cough.  Denies wheezing.  Denies sputum production Gastrointestinal: Reports nausea and vomiting. Denies abdominal pain, swelling.  Denies diarrhea.  Denies melena.  Denies hematemesis. Musculoskeletal: Denies limitation of movement.  Denies deformity or swelling. Reports history of back pain due to cancer that is improved after radiation treatment.  Genitourinary: Denies pelvic pain.  Denies urinary frequency or hesitancy.  Denies dysuria.  Skin: Denies rash.  Denies petechiae, purpura, ecchymosis. Neurological: Denies headache.  Denies syncope.  Denies seizure activity.  Denies weakness or paresthesia.  Denies slurred speech, drooping face.  Denies visual change. Psychiatric: Denies depression, anxiety.  Denies hallucinations.  Past Medical History:  Diagnosis Date  . Arthritis   . B12 deficiency anemia   . Diabetes mellitus ORAL AND INSULIN MEDS  . Fibrosis of knee joint RIGHT  . Heart palpitations SINCE 2008 TAKES BETA BLOCKER  . 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 Hyperlipidemia   . Hypertension   . Neuropathy, diabetic ([place]) RIGHT FOOT MILD NUMBNESS  . PONV (postoperative nausea and vomiting)     Past Surgical History:  Procedure Laterality Date  . ABDOMINAL HYSTERECTOMY    . BALLOON DILATION N/A 06/12/2015   Procedure: BALLOON DILATION;  Surgeon:  [name], MD;  Location: [facility];  Service: Endoscopy;  Laterality: N/A;  . biopsy of lung    . BREAST LUMPECTOMY  07-27-2007   LEFT   . CARDIAC CATHETERIZATION  2001   MIMINAL PLAQUE RCA, CIRCUMFLEX, LAD;   NORMAL LVF  . CERVICAL FUSION  1996   C3 - 5  . ESOPHAGOGASTRODUODENOSCOPY (EGD) WITH PROPOFOL  N/A 06/12/2015   Procedure: ESOPHAGOGASTRODUODENOSCOPY (EGD) WITH PROPOFOL;  Surgeon: [name], MD;  Location: [facility];  Service: Endoscopy;  Laterality: N/A;  . EXCHANGE LEFT BREAST IMPLANT/ CAPSULOTOMY/ RIGHT MASTOPEXY  11-28-2008  . IR IMAGING GUIDED PORT INSERTION  11/03/2020  . JOINT REPLACEMENT    . KNEE ARTHROSCOPY  1999   RIGHT  . KNEE ARTHROTOMY  04-03-2011   RIGHT KNEE/ SCAR EXCISION/ POLYTHYLENE EXCHANGE  . KNEE CLOSED REDUCTION  07/31/2011   Procedure: CLOSED MANIPULATION KNEE;  Surgeon: [name], MD;  Location: [facility];  Service: Orthopedics;  Laterality: Right;  . LEFT BREAST BX ;  X2  1976  . MANIPULATION KNEE JOINT  04-02-2006   CLOSED --  RIGHT   . TOTAL ABDOMINAL HYSTERECTOMY W/ BILATERAL SALPINGOOPHORECTOMY  1989   AND APPENDECTOMY  . TOTAL KNEE ARTHROPLASTY  02-03-2006   RIGHT  . TOTAL KNEE ARTHROPLASTY Left 10/23/2015   Procedure: TOTAL LEFT KNEE ARTHROPLASTY;  Surgeon: [name], MD;  Location: [facility];  Service: Orthopedics;  Laterality: Left;  . TOTAL LEFT MASTECTOMY  09-29-2007  . TUBAL LIGATION  1976  . [place]    Social History  reports that she has never smoked. She has never used smokeless tobacco. She reports that she does not drink alcohol and does not use drugs.  Allergies  Allergen Reactions  . Abaloparatide Other (See Comments)  . Contrast Media [Iodinated Diagnostic Agents] Hives and Nausea And Vomiting    Patient reports that reaction was hives at time on contrast administration  . Empagliflozin Other (See Comments)  . Epinephrine Other (See Comments)    DIZZY AND ALMOST SYNCOPE  . Exenatide Other (See Comments)  . Liraglutide Other (See Comments)  . Parathyroid Hormone (Recomb) Other (See Comments)  . Sulfa Antibiotics Hives    Family History  Problem Relation Age of Onset  . Heart disease Mother   . Stroke Father   . Cancer Sister        stomach  . Cancer Brother        colon   . Breast cancer Neg Hx      Prior to Admission medications   Medication Sig Start Date End Date Taking? Authorizing Provider  BD INSULIN SYRINGE U/F 31G X 5/16" 1 ML MISC USE WITH INJECTIONS 10/14/20   [provider]  cetirizine (ZYRTEC) 10 MG tablet Take 10 mg by mouth daily.    [provider]  Cholecalciferol 50 MCG (2000 UT) CAPS Take 1 capsule by mouth daily.    [provider]  fentaNYL (DURAGESIC) 25 MCG/HR Place 1 patch onto the skin every 3 (three) days. 11/01/20   Heilingoetter, Cassandra L, PA-C  furosemide (LASIX) 40 MG tablet Take 20 mg by mouth daily.    [provider]  gabapentin (NEURONTIN) 100 MG capsule Take 1 capsule (100 mg total) by mouth 3 (three) times daily. 09/21/20   Curt Bears, MD  HYDROmorphone (DILAUDID) 4 MG tablet Take 1 tablet (4 mg total) by mouth every 4 (four) hours as needed for severe pain. 10/05/20   Kinard,  Jeneen Rinks, MD  insulin glargine (LANTUS) 100 UNIT/ML injection Inject 55 Units into the skin every morning.    [provider]  JANUMET 50-500 MG tablet Take 1 tablet by mouth 2 (two) times daily. 04/18/15   [provider]  lidocaine-prilocaine (EMLA) cream Apply 1 application topically as needed. 11/01/20   Heilingoetter, Cassandra L, PA-C  lisinopril (PRINIVIL,ZESTRIL) 5 MG tablet Take 2.5 mg by mouth daily.     [provider]  LORazepam (ATIVAN) 0.5 MG tablet Take 1 tablet (0.5 mg total) by mouth every 8 (eight) hours. Place sublingual as needed for nausea 10/12/20   Gery Pray, MD  LYLLANA 0.025 MG/24HR Place 1 patch onto the skin 2 (two) times a week. 10/01/20   [provider]  methocarbamol (ROBAXIN) 500 MG tablet Take 1 tablet (500 mg total) by mouth every 6 (six) hours as needed for muscle spasms. 10/24/15   Constable, Amber, PA-C  metoprolol succinate (TOPROL-XL) 50 MG 24 hr tablet Take 50 mg by mouth daily. Take with or immediately following a meal.    [provider]  OLANZapine zydis (ZYPREXA) 10 MG disintegrating tablet Take 1 tablet (10 mg total) by mouth at bedtime as needed. For nausea and /or vomiting. 10/21/20   Curt Bears, MD  oxyCODONE-acetaminophen (PERCOCET/ROXICET) 5-325 MG tablet Take 1 tablet by mouth every 6 (six) hours as needed for severe pain. 10/03/20   Curt Bears, MD  potassium chloride SA (KLOR-CON) 20 MEQ tablet Take 1 tablet (20 mEq total) by mouth daily. 10/03/20   Curt Bears, MD  rosuvastatin (CRESTOR) 40 MG tablet Take 10 mg by mouth daily.    [provider]  senna (SENOKOT) 8.6 MG tablet Take 1 tablet by mouth daily.    [provider]    Physical Exam: Vitals:   11/08/20 2200 11/08/20 2215 11/08/20 2230 11/08/20 2330  BP: 117/64  120/68 119/61  Pulse: (!) 108 (!) 110 (!) 107 (!) 105  Resp: 18 20 (!) 26 (!) 28  Temp:      TempSrc:      SpO2: 99% 98% 100% 96%  Weight:      Height:        Constitutional: NAD, calm, comfortable Vitals:   11/08/20 2200 11/08/20 2215 11/08/20 2230 11/08/20 2330  BP: 117/64  120/68 119/61  Pulse: (!) 108 (!) 110 (!) 107 (!) 105  Resp: 18 20 (!) 26 (!) 28  Temp:      TempSrc:      SpO2: 99% 98% 100% 96%  Weight:      Height:       General: WDWN, Alert and oriented x3.  Eyes: EOMI, PERRL, conjunctivae normal.  Sclera nonicteric HENT:  [place]/AT, external ears normal.  Nares patent without epistasis.  Mucous membranes are dry. Neck: Soft, normal range of motion, supple, no masses, no thyromegaly.  Trachea midline Respiratory: clear to auscultation bilaterally, no wheezing, no crackles. Normal respiratory effort. No accessory muscle use.  Cardiovascular: Regular rate and rhythm, no murmurs / rubs / gallops. No extremity edema. 2+ pedal pulses.  Abdomen: Soft, no tenderness, nondistended, no rebound or guarding.  No masses palpated.  Bowel sounds normoactive Musculoskeletal: FROM. no cyanosis. No joint deformity upper and lower extremities.  Normal muscle tone.  Posterior left upper thigh with a 3 cm diameter firm nontender mass. Skin: Warm, dry, intact no rashes, lesions, ulcers. No induration Neurologic: CN 2-12 grossly intact.  Normal speech.  Sensation intact. Strength 5/5 in all extremities.  Psychiatric: Normal judgment and insight.  Normal mood.    Labs on Admission: I have personally reviewed following labs and imaging studies  CBC: Recent Labs  Lab 11/08/20 1758  WBC 8.3  NEUTROABS 6.1  HGB 9.1*  HCT 29.5*  MCV 87.0  PLT 941    Basic Metabolic Panel: Recent Labs  Lab 11/08/20 1758  NA 138  K 3.6  CL 101  CO2 27  GLUCOSE 67*  BUN 12  CREATININE 0.85  CALCIUM 9.4    GFR: Estimated Creatinine Clearance: 50.5 mL/min (by C-G formula based on SCr of 0.85 mg/dL).  Liver Function Tests: Recent Labs  Lab 11/08/20 1758  AST 13*  ALT 12  ALKPHOS 53  BILITOT 0.4  PROT 6.4*  ALBUMIN 2.8*    Urine analysis:    Component Value Date/Time   COLORURINE YELLOW 06/05/2019 1310   APPEARANCEUR CLEAR 06/05/2019 1310   LABSPEC 1.020 06/05/2019 1310   PHURINE 5.5 06/05/2019 1310   GLUCOSEU NEGATIVE 06/05/2019 1310   HGBUR NEGATIVE 06/05/2019 1310   BILIRUBINUR NEGATIVE 06/05/2019 1310   KETONESUR NEGATIVE 06/05/2019 1310   PROTEINUR NEGATIVE 06/05/2019 1310   UROBILINOGEN 0.2 03/27/2011 1105   NITRITE NEGATIVE 06/05/2019 1310   LEUKOCYTESUR NEGATIVE 06/05/2019 1310    Radiological Exams on Admission: No results found.   Assessment/Plan Principal Problem:   Intractable nausea and vomiting [name] is admitted to [place] floor.  She has been given Zofran in the emergency room which has helped. Continue with Zofran as needed for control of nausea and vomiting. IV fluid hydration with LR at 100 ml/hr overnight.  Active Problems:   Controlled type 2 diabetes mellitus with hypoglycemia  Hold Janumet and Lantus with patient having hypoglycemia when she arrived.  Patient has had poor p.o. intake  recently with nausea and vomiting.  Will provide clear liquids and advance slowly as tolerated    Essential hypertension Continue home medications of lisinopril and metoprolol.  Monitor blood pressure    Mass of left thigh Patient with a firm 3 cm diameter mass on the posterior upper left thigh that she states she first noticed a few days ago.  Will obtain ultrasound of the area to evaluate.  May need to have CT or MRI depending on ultrasound results.    Breast cancer-high grade sarcoma/phylloides tumor Followed by oncology    DVT prophylaxis: Lovenox for DVT prophylaxis Code Status:   Full code Family Communication:  Nursing plan discussed with patient.  She verbalized understanding agrees with plan.  Further recommendations to follow as clinically indicated Disposition Plan:   Patient is from:  Home  Anticipated DC to:  Home  Anticipated DC date:  Anticipate 2 midnight stay  Anticipated DC barriers: No barriers to discharge identified at this time  Admission status:  Inpatient  Yevonne Aline Ayaansh Smail MD [facility]  How to contact the [facility] Attending or Consulting provider [facility] or covering provider during after hours [place], for this patient?   1. Check the care team in [facility] and look for a) attending/consulting [facility] provider listed and b) the [facility] team listed 2. Log into [email or web address] and use [place]'s universal password to access. If you do not have the password, please contact the hospital operator. 3. Locate the [facility] provider you are looking for under [facility] and page to a number that you can be directly reached. 4. If you still have difficulty reaching the provider, please page the [facility] (Director on Call) for the Hospitalists  listed on amion for assistance.  11/09/2020, 12:54 AM

## 2020-11-09 NOTE — Progress Notes (Addendum)
PROGRESS NOTE    [name]  OQH:[id number] DOB: 1940-12-05 DOA: 11/08/2020 PCP: Ann Huddle, MD    Brief Narrative:  [name] was admitted to the hospital with a working diagnosis of intractable nausea and vomiting, in the setting of metastatic undifferentiated spindle cell neoplasm, affecting the upper gastrointestinal tract.   80 year old female past medical history for type 2 diabetes mellitus, hypertension, and breast cancer who presented with intractable nausea and vomiting.  She reports 3 weeks of progressive and worsening nausea and vomiting, it has been refractory to outpatient management with antiemetics, benzodiazepines and olanzapine.  Positive weight loss about 20 pounds over the last 4 weeks, associated with generalized weakness and dizziness.  On her initial physical examination blood pressure 117/64, heart rate 108, respiratory rate 18, oxygen saturation 98%, lungs clear to auscultation bilaterally, heart S1-S2, present, rhythmic, abdomen soft nontender, nondistended, no lower extremity edema.  Sodium 138, potassium 3.6, chloride 1 1, bicarb 27, glucose 67, BUN 12, creatinine 0.85, white count 8.3, hemoglobin 9.1, hematocrit 29.5, platelets 344. SARS COVID-19 negative.  Urinalysis specific gravity 1.008.  Assessment & Plan:   Principal Problem:   Intractable nausea and vomiting Active Problems:   Breast cancer-high grade sarcoma/phylloides tumor   Controlled type 2 diabetes mellitus with hypoglycemia (HCC)   Essential hypertension   Mass of left thigh   1. Metastatic poorly differentiated spindle cell neoplasm, affecting upper gastrointestinal tract. Complicated with intractable nausea, vomiting and diarrhea.  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  Metastatic disease diagnosed in  March 2022, positive right lung mass, right supraclavicular, hilar, pericardiac and left adrenal mass.  Hypermetabolic duodenal wall thickening and hypermetabolic periportal adenopathy, hypermetabolism in the proximal stomach and proximal jejunum.  Completed radiation therapy 10/24/2020  Plan for chemotherapy, first dose expected 11/09/2020.  Patient was referred to [facility] for possible stomach biopsy.   Continue supportive medical therapy with IV fluids, clear liquid diet and as needed antiemetics, along with scheduled antiacids. Consult Oncology, in regards of next GI work up, either EGD in [facility] system or wait for [facility] evaluation.   2. HTN. Continue blood pressure control with metoprolol and lisinopril.   3. Dyslipidemia. Continue statin therapy.   4. Hypokalemia, hypernatremia and contraction metabolic alkalosis. Na is up to 145, K is 3,4 and serum bicarbonate at 30.   Patient with very poor oral intake due to nausea and vomiting, positive diarrhea.   Change fluids to D5 0.45% saline with 40 kcl, at 100 ml per hr.  Follow up renal function in am, avoid hypotension and nephrotoxic medications.   5. Left thigh mass, [place] resulted in 6,3 cm mass, will need CT for further workup.   6. Chronic anemia, malignancy related. Hgb is 8,4 and hct at 27,2. No indication for prbc transfusion.   Patient continue to be at high risk for worsening nausea and vomiting.   Status is: Inpatient  Remains inpatient appropriate because:IV treatments appropriate due to intensity of illness or inability to take PO   Dispo: The patient is from: Home              Anticipated d/c is to: Home              Patient currently is not medically stable to d/c.   Difficult to place patient No   DVT prophylaxis: Enoxaparin   Code  Status:   full  Family Communication:  I spoke with patient's daughter at the bedside, we talked in detail about patient's condition, plan of care and prognosis and all questions were  addressed.     Consultants:   Oncology    Subjective: Patient continue to have nausea and vomiting, profuse diarrhea, no chest pain or dyspnea. Poor oral intake.   Objective: Vitals:   11/09/20 0724 11/09/20 0809 11/09/20 0953 11/09/20 1215  BP:  123/64 107/64 119/67  Pulse:  (!) 115 (!) 107 95  Resp:  19 18 18   Temp: 98.2 F (36.8 C) 98 F (36.7 C) 97.6 F (36.4 C) 98.2 F (36.8 C)  TempSrc: Oral Oral Oral Oral  SpO2:  100% 97% 96%  Weight:      Height:        Intake/Output Summary (Last 24 hours) at 11/09/2020 1429 Last data filed at 11/09/2020 1157 Gross per 24 hour  Intake 1472 ml  Output --  Net 1472 ml   Filed Weights   11/08/20 1655  Weight: 63.5 kg    Examination:   General: Not in pain or dyspnea, deconditioned and ill looking appearing  Neurology: Awake and alert, non focal  E ENT: mild pallor, no icterus, oral mucosa moist Cardiovascular: No JVD. S1-S2 present, rhythmic, no gallops, rubs, or murmurs. No lower extremity edema. Pulmonary: positive breath sounds bilaterally, with wheezing, rhonchi or rales. Gastrointestinal. Abdomen mild distended but not tender Skin. No rashes Musculoskeletal: no joint deformities     Data Reviewed: I have personally reviewed following labs and imaging studies  CBC: Recent Labs  Lab 11/08/20 1758 11/09/20 0527  WBC 8.3 6.4  NEUTROABS 6.1  --   HGB 9.1* 8.4*  HCT 29.5* 27.2*  MCV 87.0 87.5  PLT 344 299   Basic Metabolic Panel: Recent Labs  Lab 11/08/20 1758 11/09/20 0527  NA 138 145  K 3.6 3.4*  CL 101 106  CO2 27 30  GLUCOSE 67* 67*  BUN 12 10  CREATININE 0.85 0.72  CALCIUM 9.4 9.4   GFR: Estimated Creatinine Clearance: 53.6 mL/min (by C-G formula based on SCr of 0.72 mg/dL). Liver Function Tests: Recent Labs  Lab 11/08/20 1758  AST 13*  ALT 12  ALKPHOS 53  BILITOT 0.4  PROT 6.4*  ALBUMIN 2.8*   No results for input(s): LIPASE, AMYLASE in the last 168 hours. No results for input(s):  AMMONIA in the last 168 hours. Coagulation Profile: No results for input(s): INR, PROTIME in the last 168 hours. Cardiac Enzymes: No results for input(s): CKTOTAL, CKMB, CKMBINDEX, TROPONINI in the last 168 hours. BNP (last 3 results) No results for input(s): PROBNP in the last 8760 hours. HbA1C: No results for input(s): HGBA1C in the last 72 hours. CBG: Recent Labs  Lab 11/08/20 2015 11/08/20 2221 11/09/20 0719  GLUCAP 42* 105* 53*   Lipid Profile: No results for input(s): CHOL, HDL, LDLCALC, TRIG, CHOLHDL, LDLDIRECT in the last 72 hours. Thyroid Function Tests: No results for input(s): TSH, T4TOTAL, FREET4, T3FREE, THYROIDAB in the last 72 hours. Anemia Panel: No results for input(s): VITAMINB12, FOLATE, FERRITIN, TIBC, IRON, RETICCTPCT in the last 72 hours.    Radiology Studies: I have reviewed all of the imaging during this hospital visit personally     Scheduled Meds: . Chlorhexidine Gluconate Cloth  6 each Topical Daily  . enoxaparin (LOVENOX) injection  40 mg Subcutaneous Q24H  . lisinopril  2.5 mg Oral Daily  . metoprolol succinate  50 mg Oral  Daily  . potassium chloride SA  20 mEq Oral Daily  . rosuvastatin  10 mg Oral Daily   Continuous Infusions: . lactated ringers 100 mL/hr at 11/09/20 1034  . ondansetron (ZOFRAN) IV       LOS: 0 days        [name] Gerome Apley, MD

## 2020-11-09 NOTE — ED Notes (Signed)
Pt ambulatory to restroom. Unable to provide urine sample at this time.

## 2020-11-10 ENCOUNTER — Ambulatory Visit: Payer: Medicare Other | Admitting: Internal Medicine

## 2020-11-10 DIAGNOSIS — R112 Nausea with vomiting, unspecified: Secondary | ICD-10-CM | POA: Diagnosis not present

## 2020-11-10 DIAGNOSIS — R2242 Localized swelling, mass and lump, left lower limb: Secondary | ICD-10-CM | POA: Diagnosis not present

## 2020-11-10 LAB — GLUCOSE, CAPILLARY
Glucose-Capillary: 147 mg/dL — ABNORMAL HIGH (ref 70–99)
Glucose-Capillary: 182 mg/dL — ABNORMAL HIGH (ref 70–99)
Glucose-Capillary: 207 mg/dL — ABNORMAL HIGH (ref 70–99)
Glucose-Capillary: 208 mg/dL — ABNORMAL HIGH (ref 70–99)
Glucose-Capillary: 210 mg/dL — ABNORMAL HIGH (ref 70–99)
Glucose-Capillary: 216 mg/dL — ABNORMAL HIGH (ref 70–99)

## 2020-11-10 LAB — BASIC METABOLIC PANEL
Anion gap: 5 (ref 5–15)
BUN: 7 mg/dL — ABNORMAL LOW (ref 8–23)
CO2: 26 mmol/L (ref 22–32)
Calcium: 9.1 mg/dL (ref 8.9–10.3)
Chloride: 110 mmol/L (ref 98–111)
Creatinine, Ser: 0.76 mg/dL (ref 0.44–1.00)
GFR, Estimated: >60 mL/min (ref >60)
Glucose, Bld: 219 mg/dL — ABNORMAL HIGH (ref 70–99)
Potassium: 4.7 mmol/L (ref 3.5–5.1)
Sodium: 141 mmol/L (ref 135–145)

## 2020-11-10 LAB — MAGNESIUM: Magnesium: 1.6 mg/dL — ABNORMAL LOW (ref 1.7–2.4)

## 2020-11-10 MED ORDER — PANTOPRAZOLE SODIUM 40 MG PO TBEC
40.0000 mg | DELAYED_RELEASE_TABLET | Freq: Two times a day (BID) | ORAL | Status: DC
  Administered 2020-11-10 – 2020-11-14 (×8): 40 mg via ORAL
  Filled 2020-11-10 (×8): qty 1

## 2020-11-10 MED ORDER — SODIUM CHLORIDE 0.45 % IV SOLN: INTRAVENOUS | Status: DC

## 2020-11-10 MED ORDER — PANCRELIPASE (LIP-PROT-AMYL) 12000-38000 UNITS PO CPEP
12000.0000 [IU] | ORAL_CAPSULE | Freq: Three times a day (TID) | ORAL | Status: DC
  Administered 2020-11-10 – 2020-11-14 (×9): 12000 [IU] via ORAL
  Filled 2020-11-10 (×10): qty 1

## 2020-11-10 MED ORDER — MAGNESIUM SULFATE 2 GM/50ML IV SOLN
2.0000 g | Freq: Once | INTRAVENOUS | Status: AC
  Administered 2020-11-10: 2 g via INTRAVENOUS
  Filled 2020-11-10: qty 50

## 2020-11-10 MED ORDER — DIPHENOXYLATE-ATROPINE 2.5-0.025 MG PO TABS
1.0000 | ORAL_TABLET | Freq: Four times a day (QID) | ORAL | Status: DC | PRN
  Administered 2020-11-10: 1 via ORAL
  Filled 2020-11-10: qty 1

## 2020-11-10 MED ORDER — FLORANEX PO PACK
1.0000 g | PACK | Freq: Three times a day (TID) | ORAL | Status: DC
  Administered 2020-11-10 – 2020-11-14 (×7): 1 g via ORAL
  Filled 2020-11-10 (×14): qty 1

## 2020-11-10 NOTE — Progress Notes (Signed)
HEMATOLOGY-ONCOLOGY PROGRESS NOTE  SUBJECTIVE: [name] is followed by Dr. 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  She was also noted to have hypermetabolic duodenal wall thickening and hypermetabolic periportal adenopathy in addition to areas of hypermetabolism in the proximal stomach and proximal jejunum.  The patient has been seen at [facility] for second opinion and they recommended treatment with Doxil.  She was supposed to begin treatment on 11/09/2020.  She presented to the hospital with nausea and vomiting.  She has been taking antiemetics at home including Ativan and Zyprexa but continued to have symptoms and was unable to tolerate p.o. intake.  She has lost about 20 pounds over the past month.  She has also been experiencing dizziness when she stands.   The patient was seen in her hospital room today.  She reports ongoing nausea and vomiting.  She tells me that she is due for an upper GI today.  She notices a "knot" in her abdomen as well as an area in her left groin.  She notes that the area in her left groin has grown very rapidly.  This area is not causing her any pain.  She is not having any fevers or chills.  Ultrasound of this area was performed on admission which showed a complex, thick-walled mass of the left thigh measuring up to 6.3 cm and CT is recommended for further characterization of what may be a necrotic mass or abscess.  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  Denies fevers or chills. Eyes: Denies blurriness of vision Ears, nose, mouth, throat, and face: Denies mucositis or sore throat Respiratory: Denies cough, dyspnea or wheezes Cardiovascular: Denies palpitation, chest discomfort Gastrointestinal: Reports nausea, vomiting, diarrhea Skin: Denies abnormal skin rashes Lymphatics: Denies new lymphadenopathy or easy bruising Neurological:Denies numbness, tingling or new weaknesses Behavioral/Psych: Mood is stable, no new changes  Extremities: No lower extremity edema All other systems were reviewed with the patient and are negative.  I have reviewed the past medical history, past surgical history, social history and family history with the patient and they are unchanged from previous note.   PHYSICAL EXAMINATION: ECOG PERFORMANCE STATUS: 2 - Symptomatic, <50% confined to bed  Vitals:   11/09/20 1943 11/10/20 0534  BP: 129/60 131/69  Pulse: 95 98  Resp: 18 17  Temp: 98.9 F (37.2 C) 97.9 F (36.6 C)  SpO2: 95% 94%   Filed Weights   11/08/20 1655  Weight: 63.5 kg    Intake/Output from previous day: 05/05 0701 - 05/06 0700 In: 3001.8 [P.O.:708; I.V.:2293.8] Out: -   GENERAL:alert, no distress and comfortable SKIN: skin color, texture, turgor are normal, no rashes or significant lesions OROPHARYNX:no  exudate, no erythema and lips, buccal mucosa, and tongue normal  LYMPH: Large palpable mass in the left inguinal area measuring approximately 6-1/2 cm LUNGS: clear to auscultation and percussion with normal breathing effort HEART: regular rate & rhythm and no murmurs and no lower  extremity edema ABDOMEN:abdomen soft, non-tender and normal bowel sounds, palpable mass measuring approximately 2 cm Musculoskeletal:no cyanosis of digits and no clubbing  NEURO: alert & oriented x 3 with fluent speech, no focal motor/sensory deficits  LABORATORY DATA:  I have reviewed the data as listed CMP Latest Ref Rng & Units 11/10/2020 11/09/2020 11/08/2020  Glucose 70 - 99 mg/dL 219(H) 67(L) 67(L)  BUN 8 - 23 mg/dL 7(L) 10 12  Creatinine 0.44 - 1.00 mg/dL 0.76 0.72 0.85  Sodium 135 - 145 mmol/L 141 145 138  Potassium 3.5 - 5.1 mmol/L 4.7 3.4(L) 3.6  Chloride 98 - 111 mmol/L 110 106 101  CO2 22 - 32 mmol/L 26 30 27   Calcium 8.9 - 10.3 mg/dL 9.1 9.4 9.4  Total Protein 6.5 - 8.1 g/dL - - 6.4(L)  Total Bilirubin 0.3 - 1.2 mg/dL - - 0.4  Alkaline Phos 38 - 126 U/L - - 53  AST 15 - 41 U/L - - 13(L)  ALT 0 - 44 U/L - - 12    Lab Results  Component Value Date   WBC 6.4 11/09/2020   HGB 8.4 (L) 11/09/2020   HCT 27.2 (L) 11/09/2020   MCV 87.5 11/09/2020   PLT 297 11/09/2020   NEUTROABS 6.1 11/08/2020    [place] LT LOWER EXTREM LTD SOFT TISSUE NON VASCULAR  Result Date: 11/09/2020 CLINICAL DATA:  Left thigh mass EXAM: ULTRASOUND LEFT LOWER EXTREMITY LIMITED TECHNIQUE: Ultrasound examination of the lower extremity soft tissues was performed in the area of clinical concern. COMPARISON:  None. FINDINGS: There is a complex, thick walled mass with central debris measuring 6.3 x 4.5 x 5.5 cm IMPRESSION: Complex, thick-walled mass of the left thigh, measuring up to 6.3 cm. CT would be helpful for further characterization. This may be a necrotic mass or an abscess. Electronically Signed   By: Ulyses Jarred M.D.   On: 11/09/2020 01:45   IR IMAGING GUIDED PORT INSERTION  Result Date: 11/03/2020 CLINICAL DATA:  METASTATIC SARCOMA OF THE LEFT BREAST EXAM: RIGHT INTERNAL JUGULAR SINGLE LUMEN POWER PORT CATHETER INSERTION Date:  11/03/2020 11/03/2020 12:33 pm Radiologist:  Jerilynn Mages. Daryll Brod, MD Guidance:   Ultrasound and fluoroscopic MEDICATIONS: 1% lidocaine local ANESTHESIA/SEDATION: Versed 2.0 mg IV; Fentanyl 100 mcg IV; Moderate Sedation Time:  27 minutes The patient was continuously monitored during the procedure by the interventional radiology nurse under my direct supervision. FLUOROSCOPY TIME:  0 minutes, 42 seconds (7 mGy) COMPLICATIONS: None immediate. CONTRAST:  None. PROCEDURE: Informed consent was obtained from the patient following explanation of the procedure, risks, benefits and alternatives. The patient understands, agrees and consents for the procedure. All questions were addressed. A time out was performed. Maximal barrier sterile technique utilized including caps, mask, sterile gowns, sterile gloves, large sterile drape, hand hygiene, and 2% chlorhexidine scrub. Under sterile conditions and local anesthesia, right internal jugular micropuncture venous access was performed. Access was performed with ultrasound. Images were obtained for documentation of the patent right internal jugular vein. A guide wire was inserted followed by a transitional dilator. This allowed insertion of a guide wire and catheter into the IVC. Measurements were obtained from the SVC / RA junction back to the right IJ venotomy site. In the right infraclavicular  chest, a subcutaneous pocket was created over the second anterior rib. This was done under sterile conditions and local anesthesia. 1% lidocaine with epinephrine was utilized for this. A 2.5 cm incision was made in the skin. Blunt dissection was performed to create a subcutaneous pocket over the right pectoralis major muscle. The pocket was flushed with saline vigorously. There was adequate hemostasis. The port catheter was assembled and checked for leakage. The port catheter was secured in the pocket with two retention sutures. The tubing was tunneled subcutaneously to the right venotomy site and inserted into the SVC/RA junction through a valved peel-away sheath.  Position was confirmed with fluoroscopy. Images were obtained for documentation. The patient tolerated the procedure well. No immediate complications. Incisions were closed in a two layer fashion with 4 - 0 Vicryl suture. Dermabond was applied to the skin. The port catheter was accessed, blood was aspirated followed by saline and heparin flushes. Needle was removed. A dry sterile dressing was applied. IMPRESSION: Ultrasound and fluoroscopically guided right internal jugular single lumen power port catheter insertion. Tip in the SVC/RA junction. Catheter ready for use. Electronically Signed   By: Jerilynn Mages.  Shick M.D.   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  Unfortunately her PET scan showed hypermetabolic right lung mass with hypermetabolic right suprahilar/right hilar/pericardiac lymph node and left adrenal mass. There was also hypermetabolic duodenal wall thickening with hypermetabolic periportal adenopathy and additional focal areas of hypermetabolism in the proximal stomach and proximal jejunum.These findings are suggestive additional areas of metastatic disease or second malignancy in that area.  She completed palliative radiotherapy to the right lung mass with chest wall invasion under the care of Dr. Sondra Come.  This was completed in April 2022.  The patient recently met with Dr. Tonye Becket at [facility] on 10/27/20 who is going to discuss her case at a multidisciplinary conference on 11/02/20  to review the PET and distribution of sub-diaphragmatic disease, and consider if she would benefit from a biopsy of  stomach or adrenal, to ensure this does not represent a 2nd primary GI malignancy. If consistent with 2nd primary, she would consider referral for en bloc resection of right post chest wall mass if feasible, and then appropriate treatment of any GI malignancy. If 2nd primary she mentioned it may also be appropriate to consider genetics eval. However, they mostly discussed if this is felt to be c/w malignant phyllodes tumor by consensus, she would recommend Doxil (or alternatively gemcitabine), balancing her age, comorbidities and histology. She is going to continue to follow with [facility] concurrently.   At her last visit, she was given the option of referral to palliative care/hospice care versus starting on systemic treatment with Doxil 50 mg/m2 IV every 4 weeks with neulasta support.  The patient is interested in treatment with Doxil.   She was scheduled to receive her first treatment on 11/07/2020, but the patient did not show up for appointment because she was not notified about it.  She subsequently developed worsening nausea and vomiting and is now admitted.  The patient reports that she is feeling somewhat better in terms of her nausea and vomiting with Zofran.  If  nausea not fully controlled with Zofran, would recommend adding Compazine and lorazepam as needed.  She tells me that she is due for an upper GI today.  We will await the results of this.  I discussed the exam findings including the enlarged mass noted in her left inguinal area and noted on ultrasound.  I suspect this is a malignancy and less likely an abscess.  We discussed proceeding with a CT scan to further characterize this.  I will order this.  We will reschedule her first cycle of Doxil in our office.    LOS: 1 day   Ann Bussing, DNP, AGPCNP-BC, AOCNP 11/10/20

## 2020-11-10 NOTE — Progress Notes (Signed)
PROGRESS NOTE    [name]  JSE:[id number] DOB: 1940-09-23 DOA: 11/08/2020 PCP: [name], MD    Brief Narrative:  [name] was admitted to the hospital with a working diagnosis of intractable nausea and vomiting, in the setting of metastatic undifferentiated spindle cell neoplasm, affecting the upper gastrointestinal tract.   80 year old female past medical history for type 2 diabetes mellitus, hypertension, and breast cancer who presented with intractable nausea and vomiting.  She reports 3 weeks of progressive and worsening nausea and vomiting, it has been refractory to outpatient management with antiemetics, benzodiazepines and olanzapine.  Positive weight loss about 20 pounds over the last 4 weeks, associated with generalized weakness and dizziness.  On her initial physical examination blood pressure 117/64, heart rate 108, respiratory rate 18, oxygen saturation 98%, lungs clear to auscultation bilaterally, heart S1-S2, present, rhythmic, abdomen soft nontender, nondistended, no lower extremity edema.  Sodium 138, potassium 3.6, chloride 1 1, bicarb 27, glucose 67, BUN 12, creatinine 0.85, white count 8.3, hemoglobin 9.1, hematocrit 29.5, platelets 344. SARS COVID-19 negative.  Urinalysis specific gravity 1.008.  Patient placed on IV fluids and antiemetics with slow improvement in her symptoms, continue to have diarrhea.    Assessment & Plan:   Principal Problem:   Intractable nausea and vomiting Active Problems:   Breast cancer-high grade sarcoma/phylloides tumor   Controlled type 2 diabetes mellitus with hypoglycemia (HCC)   Essential hypertension   Mass of left thigh   1. Metastatic poorly differentiated spindle cell neoplasm, affecting upper gastrointestinal tract. Complicated with intractable nausea, vomiting and diarrhea.  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  Metastatic disease diagnosed in March 2022, positive right lung mass, right supraclavicular, hilar, pericardiac and left adrenal mass.  Hypermetabolic duodenal wall thickening and hypermetabolic periportal adenopathy, hypermetabolism in the proximal stomach and proximal jejunum.  Completed radiation therapy 10/24/2020  Plan for chemotherapy, first dose expected 11/09/2020.  Patient was referred to [facility] for possible stomach biopsy.   Nausea and vomiting have improved, but continue to have diarrhea. Plan to advance diet to full liquids, will add probiotics and pancreatic enzymes, as needed anti motility agents. \ Continue hydration with IV fluids.   If GI symptoms improve will plan to follow up with [facility] as outpatient for gastric biopsy. If persistent symptoms and not able to eat will call GI consultation.   2. HTN. On metoprolol and lisinopril for blood pressure control with good toleration.   3. Dyslipidemia. On statin therapy.   4. Hypokalemia, hypernatremia and contraction metabolic alkalosis.  Renal function with serum cr at 0,76, Na 141, K 4,7 and bicarbonate at 26. Mg is 1,6. Will change IV fluids with half normal saline ay 50 ml per hr and follow up renal function in am. Add 2 g Mag sulfate.   5. Left thigh mass, [place] resulted in 6,3 cm mass. Follow with CT, if positive for possible malignancy, may be a good option for biopsy   6. Chronic anemia, malignancy related. Cell count has been stable.    Patient continue to be at high risk for worsening nausea and vomiting,.   Status is: Inpatient  Remains inpatient appropriate because:Inpatient level of care appropriate due to severity of illness   Dispo: The patient is from: Home              Anticipated d/c is to:  Home              Patient currently is not medically stable to d/c.   Difficult to place patient No   DVT  prophylaxis: Enoxaparin   Code Status:   full  Family Communication:  I spoke with patient's daughter at the bedside, we talked in detail about patient's condition, plan of care and prognosis and all questions were addressed.     Consultants:   Oncology     Subjective: Patient with improvement in nausea and vomiting, but continue with diarrhea, no chest pain or dyspnea,.   Objective: Vitals:   11/09/20 1943 11/09/20 1943 11/10/20 0534 11/10/20 1252  BP: 129/60 129/60 131/69 135/80  Pulse: 96 95 98 99  Resp: 18 18 17 16   Temp: 98.9 F (37.2 C) 98.9 F (37.2 C) 97.9 F (36.6 C) 98.8 F (37.1 C)  TempSrc: Oral Oral Oral Oral  SpO2: 93% 95% 94% 97%  Weight:      Height:        Intake/Output Summary (Last 24 hours) at 11/10/2020 1604 Last data filed at 11/10/2020 1500 Gross per 24 hour  Intake 2529.77 ml  Output --  Net 2529.77 ml   Filed Weights   11/08/20 1655  Weight: 63.5 kg    Examination:   General: Not in pain or dyspnea, deconditioned  Neurology: Awake and alert, non focal  E ENT: mild pallor, no icterus, oral mucosa moist Cardiovascular: No JVD. S1-S2 present, rhythmic, no gallops, rubs, or murmurs. No lower extremity edema. Pulmonary: positive breath sounds bilaterally, with wheezing, rhonchi or rales. Gastrointestinal. Abdomen soft and non tender Skin. No rashes Musculoskeletal: no joint deformities     Data Reviewed: I have personally reviewed following labs and imaging studies  CBC: Recent Labs  Lab 11/08/20 1758 11/09/20 0527  WBC 8.3 6.4  NEUTROABS 6.1  --   HGB 9.1* 8.4*  HCT 29.5* 27.2*  MCV 87.0 87.5  PLT 344 588   Basic Metabolic Panel: Recent Labs  Lab 11/08/20 1758 11/09/20 0527 11/10/20 0853  NA 138 145 141  K 3.6 3.4* 4.7  CL 101 106 110  CO2 27 30 26   GLUCOSE 67* 67* 219*  BUN 12 10 7*  CREATININE 0.85 0.72 0.76  CALCIUM 9.4 9.4 9.1  MG  --   --  1.6*   GFR: Estimated Creatinine Clearance: 53.6 mL/min (by C-G  formula based on SCr of 0.76 mg/dL). Liver Function Tests: Recent Labs  Lab 11/08/20 1758  AST 13*  ALT 12  ALKPHOS 53  BILITOT 0.4  PROT 6.4*  ALBUMIN 2.8*   No results for input(s): LIPASE, AMYLASE in the last 168 hours. No results for input(s): AMMONIA in the last 168 hours. Coagulation Profile: No results for input(s): INR, PROTIME in the last 168 hours. Cardiac Enzymes: No results for input(s): CKTOTAL, CKMB, CKMBINDEX, TROPONINI in the last 168 hours. BNP (last 3 results) No results for input(s): PROBNP in the last 8760 hours. HbA1C: No results for input(s): HGBA1C in the last 72 hours. CBG: Recent Labs  Lab 11/09/20 2017 11/09/20 2359 11/10/20 0531 11/10/20 0736 11/10/20 1215  GLUCAP 88 147* 182* 208* 210*   Lipid Profile: No results for input(s): CHOL, HDL, LDLCALC, TRIG, CHOLHDL, LDLDIRECT in the last 72 hours. Thyroid Function Tests: No results for input(s): TSH, T4TOTAL, FREET4, T3FREE, THYROIDAB in the last 72 hours. Anemia Panel: No results for input(s): VITAMINB12, FOLATE, FERRITIN, TIBC, IRON, RETICCTPCT in the last 72 hours.  Radiology Studies: I have reviewed all of the imaging during this hospital visit personally     Scheduled Meds: . Chlorhexidine Gluconate Cloth  6 each Topical Daily  . enoxaparin (LOVENOX) injection  40 mg Subcutaneous Q24H  . lisinopril  2.5 mg Oral Daily  . metoprolol succinate  50 mg Oral Daily  . pantoprazole (PROTONIX) IV  40 mg Intravenous Q24H  . rosuvastatin  10 mg Oral Daily   Continuous Infusions: . dextrose 5 % and 0.45 % NaCl with KCl 40 mEq/L 100 mL/hr at 11/10/20 1230  . ondansetron (ZOFRAN) IV       LOS: 1 day        Justus Duerr Gerome Apley, MD

## 2020-11-10 NOTE — Progress Notes (Signed)
Called by Crystal in phlebotomy regarding pt having a port.  Flowsheet shows a port placed 11/03/20. Contacted RN, Mollie Germany regarding current status and she confirmed it is accessed.  RN to place routine line care orders.  Advised IVT will reaccess,  change dressing today, and do DBIV.

## 2020-11-11 ENCOUNTER — Inpatient Hospital Stay (HOSPITAL_COMMUNITY): Payer: Medicare Other

## 2020-11-11 LAB — BASIC METABOLIC PANEL
Anion gap: 10 (ref 5–15)
BUN: 7 mg/dL — ABNORMAL LOW (ref 8–23)
CO2: 23 mmol/L (ref 22–32)
Calcium: 8.6 mg/dL — ABNORMAL LOW (ref 8.9–10.3)
Chloride: 103 mmol/L (ref 98–111)
Creatinine, Ser: 0.71 mg/dL (ref 0.44–1.00)
GFR, Estimated: >60 mL/min (ref >60)
Glucose, Bld: 191 mg/dL — ABNORMAL HIGH (ref 70–99)
Potassium: 4.4 mmol/L (ref 3.5–5.1)
Sodium: 136 mmol/L (ref 135–145)

## 2020-11-11 LAB — GLUCOSE, CAPILLARY
Glucose-Capillary: 164 mg/dL — ABNORMAL HIGH (ref 70–99)
Glucose-Capillary: 168 mg/dL — ABNORMAL HIGH (ref 70–99)
Glucose-Capillary: 168 mg/dL — ABNORMAL HIGH (ref 70–99)
Glucose-Capillary: 168 mg/dL — ABNORMAL HIGH (ref 70–99)
Glucose-Capillary: 172 mg/dL — ABNORMAL HIGH (ref 70–99)
Glucose-Capillary: 178 mg/dL — ABNORMAL HIGH (ref 70–99)

## 2020-11-11 MED ORDER — TRAZODONE HCL 50 MG PO TABS
50.0000 mg | ORAL_TABLET | Freq: Once | ORAL | Status: AC
  Administered 2020-11-11: 50 mg via ORAL
  Filled 2020-11-11: qty 1

## 2020-11-11 NOTE — Progress Notes (Signed)
PROGRESS NOTE    Khady Vandenberg  TKW:[id number] DOB: 21-Sep-1940 DOA: 11/08/2020 PCP: [name], MD    Brief Narrative:  Mrs. Vannostrand was admitted to the hospital with a working diagnosis of intractable nausea and vomiting, in the setting of metastatic undifferentiated spindle cell neoplasm, affecting the upper gastrointestinal tract.  80 year old female past medical history for type 2 diabetes mellitus, hypertension, and breast cancer who presented with intractable nausea and vomiting. She reports 3 weeks of progressive and worsening nausea and vomiting, it has been refractory to outpatient management with antiemetics,benzodiazepines and olanzapine. Positive weight loss about 20 pounds over the last 4 weeks,associated with generalized weakness and dizziness. On her initial physical examination blood pressure 117/64, heart rate 108, respiratory rate 18, oxygen saturation 98%,lungs clear to auscultation bilaterally, heart S1-S2, present, rhythmic, abdomen soft nontender, nondistended,no lower extremity edema.  Sodium 138, potassium 3.6, chloride 1 1, bicarb 27, glucose 67, BUN 12, creatinine 0.85, white count 8.3, hemoglobin 9.1, hematocrit 29.5, platelets 344. SARS COVID-19 negative.  Urinalysis specific gravity 1.008.  Patient placed on IV fluids and antiemetics with slow improvement in her symptoms, continue to have diarrhea.   Slowly her GI symptoms are improving, diet has been advancing as tolerated.    Assessment & Plan:   Principal Problem:   Intractable nausea and vomiting Active Problems:   Breast cancer-high grade sarcoma/phylloides tumor   Controlled type 2 diabetes mellitus with hypoglycemia (HCC)   Essential hypertension   Mass of left thigh   1. Metastatic poorly differentiated spindle cell neoplasm, affecting upper gastrointestinal tract. Complicated with intractable nausea, vomiting and diarrhea.  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 Metastatic disease diagnosed in March 2022, positive right lung mass, right supraclavicular, hilar, pericardiac and left adrenal mass. Hypermetabolic duodenal wall thickening and hypermetabolic periportal adenopathy,hypermetabolism in the proximal stomach and proximal jejunum.  Completed radiation therapy 10/24/2020  Plan for chemotherapy, first dose expected 11/09/2020.  Patient was referred to [facility] for possible stomach biopsy.   Her gastrointestinal symptoms are improving, no further nausea or vomiting, diarrhea has improved. Continue supportive medical therapy with antiacids, antiemetics, probiotics and pancreatic enzymes.   Left thigh mass, CT with necrotic tissue, will discuss with oncology possible biopsy on this hospitalization.  Advance diet to regular.   2. HTN. Continue blood pressure control with metoprolol and lisinopril.  3. Dyslipidemia. Continue with statin therapy.   4. Hypokalemia, hypernatremia and contraction metabolic alkalosis.  Patient now tolerating po better., no nausea or vomiting and diarrhea improving.  Discontinue IV fluids.   5. Left thigh mass, [place] resulted in 6,3 cm mass, CT with necrotic tissue, plan for possible biopsy during this hospitalization.   6. Chronic anemia, malignancy related. stable cell counts.    Status is: Inpatient  Remains inpatient appropriate because:Inpatient level of care appropriate due to severity of illness   Dispo: The patient is from: Home              Anticipated d/c is to: Home              Patient currently is not medically stable to d/c.   Difficult to place patient No  DVT prophylaxis: Enoxaparin   Code Status:   full  Family Communication:  I spoke with patient's son at the bedside, we talked in detail about patient's condition, plan of care and  prognosis and all  questions were addressed.     Consultants:   Oncology    Subjective: Patient is tolerating well full liquids, no nausea or vomiting, diarrhea has improved, no chest pain or dyspnea, continue to be very weak and deconditioned.   Objective: Vitals:   11/10/20 1252 11/10/20 2046 11/11/20 0350 11/11/20 1324  BP: 135/80 140/74 131/87 121/75  Pulse: 99 91 98 87  Resp: 16 20 19 15   Temp: 98.8 F (37.1 C) 98.1 F (36.7 C) 97.8 F (36.6 C) 97.8 F (36.6 C)  TempSrc: Oral Oral Oral Oral  SpO2: 97% 98% 95% 97%  Weight:      Height:        Intake/Output Summary (Last 24 hours) at 11/11/2020 1355 Last data filed at 11/11/2020 0900 Gross per 24 hour  Intake 120 ml  Output --  Net 120 ml   Filed Weights   11/08/20 1655  Weight: 63.5 kg    Examination:   General: Not in pain or dyspnea, deconditioned  Neurology: Awake and alert, non focal  E ENT: mild pallor, no icterus, oral mucosa moist Cardiovascular: No JVD. S1-S2 present, rhythmic, no gallops, rubs, or murmurs. No lower extremity edema. Pulmonary: positive breath sounds bilaterally, adequate air movement, no wheezing, rhonchi or rales. Gastrointestinal. Abdomen soft and non tender Skin. No rashes Musculoskeletal: no joint deformities     Data Reviewed: I have personally reviewed following labs and imaging studies  CBC: Recent Labs  Lab 11/08/20 1758 11/09/20 0527  WBC 8.3 6.4  NEUTROABS 6.1  --   HGB 9.1* 8.4*  HCT 29.5* 27.2*  MCV 87.0 87.5  PLT 344 202   Basic Metabolic Panel: Recent Labs  Lab 11/08/20 1758 11/09/20 0527 11/10/20 0853 11/11/20 0501  NA 138 145 141 136  K 3.6 3.4* 4.7 4.4  CL 101 106 110 103  CO2 27 30 26 23   GLUCOSE 67* 67* 219* 191*  BUN 12 10 7* 7*  CREATININE 0.85 0.72 0.76 0.71  CALCIUM 9.4 9.4 9.1 8.6*  MG  --   --  1.6*  --    GFR: Estimated Creatinine Clearance: 53.6 mL/min (by C-G formula based on SCr of 0.71 mg/dL). Liver Function Tests: Recent Labs   Lab 11/08/20 1758  AST 13*  ALT 12  ALKPHOS 53  BILITOT 0.4  PROT 6.4*  ALBUMIN 2.8*   No results for input(s): LIPASE, AMYLASE in the last 168 hours. No results for input(s): AMMONIA in the last 168 hours. Coagulation Profile: No results for input(s): INR, PROTIME in the last 168 hours. Cardiac Enzymes: No results for input(s): CKTOTAL, CKMB, CKMBINDEX, TROPONINI in the last 168 hours. BNP (last 3 results) No results for input(s): PROBNP in the last 8760 hours. HbA1C: No results for input(s): HGBA1C in the last 72 hours. CBG: Recent Labs  Lab 11/10/20 2043 11/11/20 0005 11/11/20 0353 11/11/20 0726 11/11/20 1155  GLUCAP 207* 168* 168* 164* 178*   Lipid Profile: No results for input(s): CHOL, HDL, LDLCALC, TRIG, CHOLHDL, LDLDIRECT in the last 72 hours. Thyroid Function Tests: No results for input(s): TSH, T4TOTAL, FREET4, T3FREE, THYROIDAB in the last 72 hours. Anemia Panel: No results for input(s): VITAMINB12, FOLATE, FERRITIN, TIBC, IRON, RETICCTPCT in the last 72 hours.    Radiology Studies: I have reviewed all of the imaging during this hospital visit personally     Scheduled Meds: . Chlorhexidine Gluconate Cloth  6 each Topical Daily  . enoxaparin (LOVENOX) injection  40 mg Subcutaneous Q24H  . lactobacillus  1  g Oral TID WC  . lipase/protease/amylase  12,000 Units Oral TID AC  . lisinopril  2.5 mg Oral Daily  . metoprolol succinate  50 mg Oral Daily  . pantoprazole  40 mg Oral BID  . rosuvastatin  10 mg Oral Daily   Continuous Infusions: . sodium chloride 50 mL/hr at 11/10/20 1740  . ondansetron (ZOFRAN) IV       LOS: 2 days        Bexleigh Theriault Gerome Apley, MD

## 2020-11-11 NOTE — Progress Notes (Signed)
Pt requesting something for sleep. Reports med she took to help her sleep the night before did not work. NP on call notified. New order given. VWilliams,RN.

## 2020-11-12 LAB — GLUCOSE, CAPILLARY
Glucose-Capillary: 179 mg/dL — ABNORMAL HIGH (ref 70–99)
Glucose-Capillary: 181 mg/dL — ABNORMAL HIGH (ref 70–99)
Glucose-Capillary: 187 mg/dL — ABNORMAL HIGH (ref 70–99)
Glucose-Capillary: 193 mg/dL — ABNORMAL HIGH (ref 70–99)
Glucose-Capillary: 194 mg/dL — ABNORMAL HIGH (ref 70–99)
Glucose-Capillary: 246 mg/dL — ABNORMAL HIGH (ref 70–99)

## 2020-11-12 MED ORDER — ENOXAPARIN SODIUM 40 MG/0.4ML IJ SOSY
40.0000 mg | PREFILLED_SYRINGE | INTRAMUSCULAR | Status: DC
  Administered 2020-11-14: 40 mg via SUBCUTANEOUS
  Filled 2020-11-12: qty 0.4

## 2020-11-12 MED ORDER — IBUPROFEN 200 MG PO TABS
400.0000 mg | ORAL_TABLET | Freq: Three times a day (TID) | ORAL | Status: DC
  Administered 2020-11-12 – 2020-11-14 (×5): 400 mg via ORAL
  Filled 2020-11-12 (×6): qty 2

## 2020-11-12 MED ORDER — OXYCODONE HCL 5 MG PO TABS
5.0000 mg | ORAL_TABLET | ORAL | Status: DC | PRN
  Administered 2020-11-12: 5 mg via ORAL
  Filled 2020-11-12: qty 1

## 2020-11-12 MED ORDER — CYCLOBENZAPRINE HCL 5 MG PO TABS
5.0000 mg | ORAL_TABLET | Freq: Three times a day (TID) | ORAL | Status: DC | PRN
  Administered 2020-11-12: 5 mg via ORAL
  Filled 2020-11-12: qty 1

## 2020-11-12 MED ORDER — DEXTROSE IN LACTATED RINGERS 5 % IV SOLN: INTRAVENOUS | Status: DC

## 2020-11-12 NOTE — Progress Notes (Signed)
PROGRESS NOTE    Bonniejean Piano  OIN:[id number] DOB: [birth year] DOA: 11/08/2020 PCP: [name], MD    Brief Narrative:  Mrs. Teigen was admitted to the hospital with a working diagnosis of intractable nausea and vomiting, in the setting of metastatic undifferentiated spindle cell neoplasm, affecting the upper gastrointestinal tract.  80 year old female past medical history for type 2 diabetes mellitus, hypertension, and breast cancer who presented with intractable nausea and vomiting. She reports 3 weeks of progressive and worsening nausea and vomiting, it has been refractory to outpatient management with antiemetics,benzodiazepines and olanzapine. Positive weight loss about 20 pounds over the last 4 weeks,associated with generalized weakness and dizziness. On her initial physical examination blood pressure 117/64, heart rate 108, respiratory rate 18, oxygen saturation 98%,lungs clear to auscultation bilaterally, heart S1-S2, present, rhythmic, abdomen soft nontender, nondistended,no lower extremity edema.  Sodium 138, potassium 3.6, chloride 1 1, bicarb 27, glucose 67, BUN 12, creatinine 0.85, white count 8.3, hemoglobin 9.1, hematocrit 29.5, platelets 344. SARS COVID-19 negative.  Urinalysis specific gravity 1.008.  Patient placed on IV fluids and antiemetics with slow improvement in her symptoms, continue to have diarrhea.  Now tolerating well regular diet. Plan for possible left thigh mass biopsy.    Assessment & Plan:   Principal Problem:   Intractable nausea and vomiting Active Problems:   Breast cancer-high grade sarcoma/phylloides tumor   Controlled type 2 diabetes mellitus with hypoglycemia (HCC)   Essential hypertension   Mass of left thigh   1. Metastatic poorly differentiated spindle cell neoplasm, affecting upper gastrointestinal tract. Complicated with intractable nausea, vomiting and diarrhea.  NEW Left thigh mass 6,3 cm, necrotic.   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 Metastatic disease diagnosed in March 2022, positive right lung mass, right supraclavicular, hilar, pericardiac and left adrenal mass. Hypermetabolic duodenal wall thickening and hypermetabolic periportal adenopathy,hypermetabolism in the proximal stomach and proximal jejunum.  Completed radiation therapy 10/24/2020  Plan for chemotherapy, first dose expected 11/09/2020.  Patient was referred to [facility] for possible stomach biopsy.   Her gastrointestinal symptoms have improved and patient is tolerating po well, regular diet. This am with new left jaw pain, no odynophagia or frank dysphagia. Tender to palpation.  Will add scheduled ibuprofen along with as needed oxycodone and cyclobenzaprine.  Consulted IR for left thigh mass biopsy. Follow with oncology recommendations in am.   2. HTN. Continue blood pressure control with metoprolol and lisinopril  3. Dyslipidemia. On statin therapy.   4. Hypokalemia, hypernatremia and contraction metabolic alkalosis.  Electrolytes have been corrected. Continue to hold on IV fluids for now.  Patient with no further diarrhea and tolerating po well.    6. Chronic anemia, malignancy related. no current indication for blood products transfusion     Status is: Inpatient  Remains inpatient appropriate because:Inpatient level of care appropriate due to severity of illness   Dispo: The patient is from: Home              Anticipated d/c is to: Home              Patient currently is not medically stable to d/c.   Difficult to place patient No   DVT prophylaxis: Enoxaparin   Code Status:   full  Family Communication:  No family at the bedside       Consultants:   Oncology   IR  Subjective: Patient this am with left jaw pain,  worse with movement, no nausea or vomiting, diarrhea improving, no chest pain or dyspnea,   Objective: Vitals:   11/11/20 0350 11/11/20 1324 11/11/20 2009 11/12/20 0354  BP: 131/87 121/75 (!) 141/70 132/73  Pulse: 98 87 93 (!) 104  Resp: 19 15 17 20   Temp: 97.8 F (36.6 C) 97.8 F (36.6 C) 98.3 F (36.8 C) 98.2 F (36.8 C)  TempSrc: Oral Oral Oral   SpO2: 95% 97% 96% 99%  Weight:      Height:        Intake/Output Summary (Last 24 hours) at 11/12/2020 1237 Last data filed at 11/12/2020 0900 Gross per 24 hour  Intake 360 ml  Output --  Net 360 ml   Filed Weights   11/08/20 1655  Weight: 63.5 kg    Examination:   General: Not in pain or dyspnea, deconditioned  Neurology: Awake and alert, non focal  E ENT: positive pallor, no icterus, oral mucosa moist. Mild local edema at the left jaw, tender to palpation, no stridor, no oral lesions or bleeding Cardiovascular: No JVD. S1-S2 present, rhythmic, no gallops, rubs, or murmurs. No lower extremity edema. Pulmonary: vesicular breath sounds bilaterally, adequate air movement, no wheezing, rhonchi or rales. Gastrointestinal. Abdomen soft and non tender. Positive mass palpable at the right upper quadrant, non tender Skin. No rashes Musculoskeletal: no joint deformities     Data Reviewed: I have personally reviewed following labs and imaging studies  CBC: Recent Labs  Lab 11/08/20 1758 11/09/20 0527  WBC 8.3 6.4  NEUTROABS 6.1  --   HGB 9.1* 8.4*  HCT 29.5* 27.2*  MCV 87.0 87.5  PLT 344 426   Basic Metabolic Panel: Recent Labs  Lab 11/08/20 1758 11/09/20 0527 11/10/20 0853 11/11/20 0501  NA 138 145 141 136  K 3.6 3.4* 4.7 4.4  CL 101 106 110 103  CO2 27 30 26 23   GLUCOSE 67* 67* 219* 191*  BUN 12 10 7* 7*  CREATININE 0.85 0.72 0.76 0.71  CALCIUM 9.4 9.4 9.1 8.6*  MG  --   --  1.6*  --    GFR: Estimated Creatinine Clearance: 53.6 mL/min (by C-G formula based on SCr of 0.71 mg/dL). Liver Function  Tests: Recent Labs  Lab 11/08/20 1758  AST 13*  ALT 12  ALKPHOS 53  BILITOT 0.4  PROT 6.4*  ALBUMIN 2.8*   No results for input(s): LIPASE, AMYLASE in the last 168 hours. No results for input(s): AMMONIA in the last 168 hours. Coagulation Profile: No results for input(s): INR, PROTIME in the last 168 hours. Cardiac Enzymes: No results for input(s): CKTOTAL, CKMB, CKMBINDEX, TROPONINI in the last 168 hours. BNP (last 3 results) No results for input(s): PROBNP in the last 8760 hours. HbA1C: No results for input(s): HGBA1C in the last 72 hours. CBG: Recent Labs  Lab 11/11/20 2007 11/12/20 0002 11/12/20 0356 11/12/20 0725 11/12/20 1204  GLUCAP 168* 193* 179* 181* 187*   Lipid Profile: No results for input(s): CHOL, HDL, LDLCALC, TRIG, CHOLHDL, LDLDIRECT in the last 72 hours. Thyroid Function Tests: No results for input(s): TSH, T4TOTAL, FREET4, T3FREE, THYROIDAB in the last 72 hours. Anemia Panel: No results for input(s): VITAMINB12, FOLATE, FERRITIN, TIBC, IRON, RETICCTPCT in the last 72 hours.    Radiology Studies: I have reviewed all of the imaging during this hospital visit personally     Scheduled Meds: . Chlorhexidine Gluconate Cloth  6 each Topical Daily  . enoxaparin (  LOVENOX) injection  40 mg Subcutaneous Q24H  . lactobacillus  1 g Oral TID WC  . lipase/protease/amylase  12,000 Units Oral TID AC  . lisinopril  2.5 mg Oral Daily  . metoprolol succinate  50 mg Oral Daily  . pantoprazole  40 mg Oral BID  . rosuvastatin  10 mg Oral Daily   Continuous Infusions: . ondansetron (ZOFRAN) IV       LOS: 3 days        Christinna Sprung Gerome Apley, MD

## 2020-11-12 NOTE — Consult Note (Signed)
Chief Complaint: Patient was seen in consultation today for left thigh mass biopsy Chief Complaint  Patient presents with  . Emesis  . Dehydration   at the request of Dr Lurline Del  Referring Physician(s): [place] NP  Supervising Physician: Jacqulynn Cadet  Patient Status: [name]  History of Present Illness: [name] is a 80 y.o. 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       She was also noted to have hypermetabolic duodenal wall thickening and hypermetabolic periportal adenopathy in addition to areas of hypermetabolism in the proximal stomach and proximal jejunum.  The patient has been seen at [facility] for second opinion and they recommended treatment with Doxil.  She was supposed to begin treatment on 11/09/2020  Admitted to [facility] 11/08/20 with N/V and wt loss Has noted new abd mass and left thigh mass-- fast growing; not painful.  [place] 5/5: IMPRESSION: Complex, thick-walled mass of the left thigh, measuring up to 6.3 cm. CT would be helpful for further characterization. This may be a necrotic mass or an abscess.  CT yesterday: IMPRESSION: 1. The large necrotic mass medially in the left thigh is not further characterized by this noncontrast CT. However, this mass was not present on the PET-CT performed only 6 weeks ago, although there was a small focus of hypermetabolic activity in the left gracilis muscle on that study. Given the history of sarcoma, this is favored to reflect an aggressive rapidly enlarging metastasis. Soft tissue abscess could have this appearance. Correlate clinically and consider further evaluation with MRI  without and with contrast and/or percutaneous sampling.  Oncology and [facility] requesting left thigh mass biopsy Dr Laurence Ferrari has reviewed imaging and approves procedure    Past Medical History:  Diagnosis Date  . Arthritis   . B12 deficiency anemia   . Diabetes mellitus ORAL AND INSULIN MEDS  . Fibrosis of knee joint RIGHT  . Heart palpitations SINCE 2008 TAKES BETA BLOCKER  . 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 Hyperlipidemia   . Hypertension   . Neuropathy, diabetic ([place]) RIGHT FOOT MILD NUMBNESS  . PONV (postoperative nausea and vomiting)     Past Surgical History:  Procedure Laterality Date  . ABDOMINAL HYSTERECTOMY    . BALLOON DILATION N/A 06/12/2015   Procedure: BALLOON DILATION;  Surgeon: [name], MD;  Location: [facility];  Service: Endoscopy;  Laterality: N/A;  . biopsy of lung    . BREAST LUMPECTOMY  07-27-2007   LEFT   . CARDIAC CATHETERIZATION  2001   MIMINAL PLAQUE RCA, CIRCUMFLEX, LAD;   NORMAL LVF  . CERVICAL FUSION  1996   C3 - 5  . ESOPHAGOGASTRODUODENOSCOPY (EGD) WITH PROPOFOL N/A 06/12/2015   Procedure: ESOPHAGOGASTRODUODENOSCOPY (EGD) WITH PROPOFOL;  Surgeon: [name], MD;  Location: [facility];  Service: Endoscopy;  Laterality: N/A;  . EXCHANGE LEFT BREAST IMPLANT/ CAPSULOTOMY/ RIGHT MASTOPEXY  11-28-2008  . IR IMAGING GUIDED PORT INSERTION  11/03/2020  . JOINT REPLACEMENT    . KNEE ARTHROSCOPY  1999   RIGHT  . KNEE ARTHROTOMY  04-03-2011   RIGHT KNEE/ SCAR EXCISION/ POLYTHYLENE EXCHANGE  . KNEE CLOSED REDUCTION  07/31/2011   Procedure: CLOSED MANIPULATION KNEE;  Surgeon: [name], MD;  Location: [facility];  Service: Orthopedics;  Laterality: Right;  . LEFT BREAST BX ;  X2  1976  . MANIPULATION KNEE JOINT  04-02-2006   CLOSED --  RIGHT   . TOTAL ABDOMINAL HYSTERECTOMY W/ BILATERAL SALPINGOOPHORECTOMY   1989   AND APPENDECTOMY  . TOTAL KNEE ARTHROPLASTY  02-03-2006   RIGHT  . TOTAL KNEE ARTHROPLASTY Left 10/23/2015   Procedure: TOTAL LEFT KNEE ARTHROPLASTY;  Surgeon: [name], MD;  Location: [facility];  Service: Orthopedics;  Laterality: Left;  . TOTAL LEFT MASTECTOMY  09-29-2007  . TUBAL LIGATION  1976  . URETHRAL DILATION  1976    Allergies: Abaloparatide, Contrast media [iodinated diagnostic agents], Empagliflozin, Epinephrine, Exenatide, Liraglutide, Parathyroid hormone (recomb), and Sulfa antibiotics  Medications: Prior to Admission medications   Medication Sig Start Date End Date Taking? Authorizing Provider  BD INSULIN SYRINGE U/F 31G X 5/16" 1 ML MISC USE WITH INJECTIONS 10/14/20  Yes [provider]  cetirizine (ZYRTEC) 10 MG tablet Take 10 mg by mouth daily.   Yes [provider]  Cholecalciferol 50 MCG (2000 UT) CAPS Take 1 capsule by mouth daily.   Yes [provider]  furosemide (LASIX) 40 MG tablet Take 20 mg by mouth daily.   Yes [provider]  gabapentin (NEURONTIN) 100 MG capsule Take 1 capsule (100 mg total) by mouth 3 (three) times daily. 09/21/20  Yes Curt Bears, MD  HYDROmorphone (DILAUDID) 4 MG tablet Take 1 tablet (4 mg total) by mouth every 4 (four) hours as needed for severe pain. 10/05/20  Yes Gery Pray, MD  insulin glargine (LANTUS) 100 UNIT/ML injection Inject 55 Units into the skin every morning.   Yes [provider]  JANUMET 50-500 MG tablet Take 1 tablet by mouth 2 (two) times daily. 04/18/15  Yes [provider]  lisinopril (PRINIVIL,ZESTRIL) 5 MG tablet Take 2.5 mg by mouth daily.    Yes [provider]  LORazepam (ATIVAN) 0.5 MG tablet Take 1 tablet (0.5 mg total) by mouth every 8 (eight) hours. Place sublingual as needed for nausea 10/12/20  Yes Gery Pray, MD  LYLLANA 0.025 MG/24HR Place 1 patch onto the skin 2 (two) times a week. 10/01/20  Yes [provider]  metoprolol  succinate (TOPROL-XL) 50 MG 24 hr tablet Take 50 mg by mouth daily. Take with or immediately following a meal.   Yes [provider]  OLANZapine zydis (ZYPREXA) 10 MG disintegrating tablet Take 1 tablet (10 mg total) by mouth at bedtime as needed. For nausea and /or vomiting. 10/21/20  Yes Curt Bears, MD  potassium chloride SA (KLOR-CON) 20 MEQ tablet Take 1 tablet (20 mEq total) by mouth daily. 10/03/20  Yes Curt Bears, MD  rosuvastatin (CRESTOR) 40 MG tablet Take 10 mg by mouth daily.   Yes [provider]  fentaNYL (DURAGESIC) 25 MCG/HR Place 1 patch onto the skin every 3 (three) days. 11/01/20   Heilingoetter, Cassandra L, PA-C  lidocaine-prilocaine (EMLA) cream Apply 1 application topically as needed. 11/01/20   Heilingoetter, Cassandra L, PA-C  methocarbamol (ROBAXIN) 500 MG tablet Take 1 tablet (500 mg total) by mouth every 6 (six) hours as needed for muscle spasms. Patient not taking: Reported on 11/09/2020 10/24/15   [place], [employer], PA-C  oxyCODONE-acetaminophen (PERCOCET/ROXICET) 5-325 MG tablet Take 1 tablet by mouth every  6 (six) hours as needed for severe pain. 10/03/20   Curt Bears, MD     Family History  Problem Relation Age of Onset  . Heart disease Mother   . Stroke Father   . Cancer Sister        stomach  . Cancer Brother        colon  . Breast cancer Neg Hx     Social History   Socioeconomic History  . Marital status: Married    Spouse name: Not on file  . Number of children: Not on file  . Years of education: Not on file  . Highest education level: Not on file  Occupational History  . Not on file  Tobacco Use  . Smoking status: Never Smoker  . Smokeless tobacco: Never Used  Vaping Use  . Vaping Use: Never used  Substance and Sexual Activity  . Alcohol use: No  . Drug use: No  . Sexual activity: Not on file  Other Topics Concern  . Not on file  Social History Narrative  . Not on file   Social Determinants of Health    Financial Resource Strain: Not on file  Food Insecurity: Not on file  Transportation Needs: Not on file  Physical Activity: Not on file  Stress: Not on file  Social Connections: Not on file    Review of Systems: A 12 point ROS discussed and pertinent positives are indicated in the HPI above.  All other systems are negative.  Review of Systems  Constitutional: Positive for activity change, fatigue and unexpected weight change.  Respiratory: Negative for cough and shortness of breath.   Cardiovascular: Negative for chest pain.  Gastrointestinal: Positive for abdominal pain, nausea and vomiting.  Musculoskeletal: Negative for back pain.  Psychiatric/Behavioral: Negative for behavioral problems and confusion.    Vital Signs: BP 132/73 (BP Location: Right Arm)   Pulse (!) 104   Temp 98.2 F (36.8 C)   Resp 20   Ht 5\' 7"  (1.702 m)   Wt 140 lb (63.5 kg)   SpO2 99%   BMI 21.93 kg/m   Physical Exam Vitals reviewed.  HENT:     Mouth/Throat:     Mouth: Mucous membranes are moist.  Cardiovascular:     Rate and Rhythm: Normal rate and regular rhythm.     Heart sounds: Normal heart sounds.  Pulmonary:     Effort: Pulmonary effort is normal.     Breath sounds: Normal breath sounds.  Abdominal:     Palpations: Abdomen is soft.     Tenderness: There is abdominal tenderness.  Musculoskeletal:        General: Normal range of motion.     Comments: Left inner thigh mass palpable; NT  Neurological:     Mental Status: She is alert and oriented to person, place, and time.  Psychiatric:        Behavior: Behavior normal.     Imaging: CT HIP LEFT WO CONTRAST  Result Date: 11/11/2020 CLINICAL DATA:  Left thigh mass. History left breast sarcoma. Right upper lobe lung biopsy 09/25/2020 positive for poorly differentiated spindle cell malignant neoplasm, suspected metastasis. EXAM: CT OF THE LEFT HIP WITHOUT CONTRAST TECHNIQUE: Multidetector CT imaging of the left hip was performed  according to the standard protocol. Multiplanar CT image reconstructions were also generated. COMPARISON:  Left thigh ultrasound 11/09/2020.  PET-CT 10/02/2020. FINDINGS: Bones/Joint/Cartilage No evidence of acute fracture or dislocation. Minimal left hip degenerative changes with mild spurring of the greater trochanter.  No lytic or sclerotic osseous lesions are identified. There is no significant hip joint effusion. Ligaments Suboptimally assessed by CT. Muscles and Tendons Corresponding with the ultrasound finding is a large necrotic mass involving the left gracilis muscle. This mass is well-circumscribed, measuring 5.8 x 6.0 x 6.2 cm. It measures soft tissue attenuation similar to muscle, although there are lower density component centrally consistent with necrosis as correlated with the ultrasound. There is no associated soft tissue emphysema. This mass was not present on the PET-CT performed only 6 weeks ago, although in retrospect, there was a small focus of hypermetabolic activity in this area (image 15/606 of the PET-CT). No other soft tissue masses are identified. Soft tissues Mild subcutaneous edema medially in the left thigh without additional focal fluid collection or mass lesion. No inguinal adenopathy. The visualized internal pelvic contents are unremarkable aside from mild distal colonic diverticulosis. IMPRESSION: 1. The large necrotic mass medially in the left thigh is not further characterized by this noncontrast CT. However, this mass was not present on the PET-CT performed only 6 weeks ago, although there was a small focus of hypermetabolic activity in the left gracilis muscle on that study. Given the history of sarcoma, this is favored to reflect an aggressive rapidly enlarging metastasis. Soft tissue abscess could have this appearance. Correlate clinically and consider further evaluation with MRI without and with contrast and/or percutaneous sampling. 2. No acute osseous findings.  Electronically Signed   By: Richardean Sale M.D.   On: 11/11/2020 11:54   [place] LT LOWER EXTREM LTD SOFT TISSUE NON VASCULAR  Result Date: 11/09/2020 CLINICAL DATA:  Left thigh mass EXAM: ULTRASOUND LEFT LOWER EXTREMITY LIMITED TECHNIQUE: Ultrasound examination of the lower extremity soft tissues was performed in the area of clinical concern. COMPARISON:  None. FINDINGS: There is a complex, thick walled mass with central debris measuring 6.3 x 4.5 x 5.5 cm IMPRESSION: Complex, thick-walled mass of the left thigh, measuring up to 6.3 cm. CT would be helpful for further characterization. This may be a necrotic mass or an abscess. Electronically Signed   By: Ulyses Jarred M.D.   On: 11/09/2020 01:45   IR IMAGING GUIDED PORT INSERTION  Result Date: 11/03/2020 CLINICAL DATA:  METASTATIC SARCOMA OF THE LEFT BREAST EXAM: RIGHT INTERNAL JUGULAR SINGLE LUMEN POWER PORT CATHETER INSERTION Date:  11/03/2020 11/03/2020 12:33 pm Radiologist:  Jerilynn Mages. Daryll Brod, MD Guidance:  Ultrasound and fluoroscopic MEDICATIONS: 1% lidocaine local ANESTHESIA/SEDATION: Versed 2.0 mg IV; Fentanyl 100 mcg IV; Moderate Sedation Time:  27 minutes The patient was continuously monitored during the procedure by the interventional radiology nurse under my direct supervision. FLUOROSCOPY TIME:  0 minutes, 42 seconds (7 mGy) COMPLICATIONS: None immediate. CONTRAST:  None. PROCEDURE: Informed consent was obtained from the patient following explanation of the procedure, risks, benefits and alternatives. The patient understands, agrees and consents for the procedure. All questions were addressed. A time out was performed. Maximal barrier sterile technique utilized including caps, mask, sterile gowns, sterile gloves, large sterile drape, hand hygiene, and 2% chlorhexidine scrub. Under sterile conditions and local anesthesia, right internal jugular micropuncture venous access was performed. Access was performed with ultrasound. Images were obtained for  documentation of the patent right internal jugular vein. A guide wire was inserted followed by a transitional dilator. This allowed insertion of a guide wire and catheter into the IVC. Measurements were obtained from the SVC / RA junction back to the right IJ venotomy site. In the right infraclavicular chest, a subcutaneous pocket was  created over the second anterior rib. This was done under sterile conditions and local anesthesia. 1% lidocaine with epinephrine was utilized for this. A 2.5 cm incision was made in the skin. Blunt dissection was performed to create a subcutaneous pocket over the right pectoralis major muscle. The pocket was flushed with saline vigorously. There was adequate hemostasis. The port catheter was assembled and checked for leakage. The port catheter was secured in the pocket with two retention sutures. The tubing was tunneled subcutaneously to the right venotomy site and inserted into the SVC/RA junction through a valved peel-away sheath. Position was confirmed with fluoroscopy. Images were obtained for documentation. The patient tolerated the procedure well. No immediate complications. Incisions were closed in a two layer fashion with 4 - 0 Vicryl suture. Dermabond was applied to the skin. The port catheter was accessed, blood was aspirated followed by saline and heparin flushes. Needle was removed. A dry sterile dressing was applied. IMPRESSION: Ultrasound and fluoroscopically guided right internal jugular single lumen power port catheter insertion. Tip in the SVC/RA junction. Catheter ready for use. Electronically Signed   By: Jerilynn Mages.  Shick M.D.   On: 11/03/2020 13:09    Labs:  CBC: Recent Labs    10/17/20 1259 11/01/20 1059 11/08/20 1758 11/09/20 0527  WBC 9.9 8.6 8.3 6.4  HGB 11.5* 10.3* 9.1* 8.4*  HCT 36.3 33.1* 29.5* 27.2*  PLT 418* 369 344 297    COAGS: Recent Labs    09/25/20 0942  INR 1.1    BMP: Recent Labs    11/08/20 1758 11/09/20 0527 11/10/20 0853  11/11/20 0501  NA 138 145 141 136  K 3.6 3.4* 4.7 4.4  CL 101 106 110 103  CO2 27 30 26 23   GLUCOSE 67* 67* 219* 191*  BUN 12 10 7* 7*  CALCIUM 9.4 9.4 9.1 8.6*  CREATININE 0.85 0.72 0.76 0.71  GFRNONAA >60 >60 >60 >60    LIVER FUNCTION TESTS: Recent Labs    10/03/20 1256 10/17/20 1259 11/01/20 1059 11/08/20 1758  BILITOT 0.4 0.4 0.6 0.4  AST 11* 13* 12* 13*  ALT 8 6 7 12   ALKPHOS 69 65 64 53  PROT 6.6 6.7 7.0 6.4*  ALBUMIN 3.1* 2.9* 2.9* 2.8*    TUMOR MARKERS: No results for input(s): AFPTM, CEA, CA199, CHROMGRNA in the last 8760 hours.  Assessment and Plan:  Known breast cancer Metastasis disease Lung and LAN New fast growing Left inner thigh mass Scheduled for biopsy 5/9 in IR Risks and benefits of left inner thigh mass biopsy was discussed with the patient and/or patient's family including, but not limited to bleeding, infection, damage to adjacent structures or low yield requiring additional tests.  All of the questions were answered and there is agreement to proceed.  Consent signed and in chart.   Thank you for this interesting consult.  I greatly enjoyed meeting Timi Reeser and look forward to participating in their care.  A copy of this report was sent to the requesting provider on this date.  Electronically Signed: Lavonia Drafts, PA-C 11/12/2020, 1:48 PM   I spent a total of 20 Minutes    in face to face in clinical consultation, greater than 50% of which was counseling/coordinating care for left inner thigh mass bx

## 2020-11-13 ENCOUNTER — Inpatient Hospital Stay (HOSPITAL_COMMUNITY): Payer: Medicare Other

## 2020-11-13 LAB — GLUCOSE, CAPILLARY
Glucose-Capillary: 223 mg/dL — ABNORMAL HIGH (ref 70–99)
Glucose-Capillary: 233 mg/dL — ABNORMAL HIGH (ref 70–99)
Glucose-Capillary: 237 mg/dL — ABNORMAL HIGH (ref 70–99)
Glucose-Capillary: 238 mg/dL — ABNORMAL HIGH (ref 70–99)
Glucose-Capillary: 241 mg/dL — ABNORMAL HIGH (ref 70–99)
Glucose-Capillary: 251 mg/dL — ABNORMAL HIGH (ref 70–99)
Glucose-Capillary: 253 mg/dL — ABNORMAL HIGH (ref 70–99)
Glucose-Capillary: 259 mg/dL — ABNORMAL HIGH (ref 70–99)

## 2020-11-13 LAB — HEMOGLOBIN A1C
Hgb A1c MFr Bld: 5.8 % — ABNORMAL HIGH (ref 4.8–5.6)
Mean Plasma Glucose: 119.76 mg/dL

## 2020-11-13 LAB — PROTIME-INR
INR: 1.1 (ref 0.8–1.2)
Prothrombin Time: 14 seconds (ref 11.4–15.2)

## 2020-11-13 MED ORDER — LIDOCAINE HCL 1 % IJ SOLN
INTRAMUSCULAR | Status: AC
  Filled 2020-11-13: qty 20

## 2020-11-13 MED ORDER — INSULIN ASPART 100 UNIT/ML IJ SOLN
0.0000 [IU] | Freq: Three times a day (TID) | INTRAMUSCULAR | Status: DC
  Administered 2020-11-13: 5 [IU] via SUBCUTANEOUS
  Administered 2020-11-14 (×2): 3 [IU] via SUBCUTANEOUS

## 2020-11-13 MED ORDER — FENTANYL CITRATE (PF) 100 MCG/2ML IJ SOLN
INTRAMUSCULAR | Status: AC
  Filled 2020-11-13: qty 2

## 2020-11-13 MED ORDER — MIDAZOLAM HCL 2 MG/2ML IJ SOLN
INTRAMUSCULAR | Status: AC
  Filled 2020-11-13: qty 2

## 2020-11-13 MED ORDER — FENTANYL CITRATE (PF) 100 MCG/2ML IJ SOLN
INTRAMUSCULAR | Status: AC | PRN
  Administered 2020-11-13 (×2): 50 ug via INTRAVENOUS

## 2020-11-13 MED ORDER — MIDAZOLAM HCL 2 MG/2ML IJ SOLN
INTRAMUSCULAR | Status: AC | PRN
  Administered 2020-11-13 (×2): 1 mg via INTRAVENOUS

## 2020-11-13 MED ORDER — LIDOCAINE HCL (PF) 1 % IJ SOLN
INTRAMUSCULAR | Status: AC | PRN
Start: 2020-11-13 — End: 2020-11-13
  Administered 2020-11-13: 10 mL via INTRADERMAL

## 2020-11-13 NOTE — Procedures (Signed)
Pre Procedure Dx: Left thigh mass Post Procedural Dx: Same  Technically successful US guided biopsy of indeterminate mass within the medial proximal aspect of the left thigh. Approximately 30 cc of bloody tinged fluid aspirated from the central aspect of the partially necrotic mass prior to acquisition of multiple core biopsies.   EBL: None No immediate complications.   Ronny Bacon, MD Pager #: [phone]

## 2020-11-13 NOTE — Progress Notes (Addendum)
PROGRESS NOTE    [name]  DXA:[id number] DOB: 10/10/1940 DOA: 11/08/2020 PCP: [name], MD    Brief Narrative:  Mrs. [name] was admitted to the hospital with a working diagnosis of intractable nausea and vomiting, in the setting of metastatic undifferentiated spindle cell neoplasm, affecting the upper gastrointestinal tract.  80 year old female past medical history for type 2 diabetes mellitus, hypertension, and breast cancer who presented with intractable nausea and vomiting. She reports 3 weeks of progressive and worsening nausea and vomiting, it has been refractory to outpatient management with antiemetics,benzodiazepines and olanzapine. Positive weight loss about 20 pounds over the last 4 weeks,associated with generalized weakness and dizziness. On her initial physical examination blood pressure 117/64, heart rate 108, respiratory rate 18, oxygen saturation 98%,lungs clear to auscultation bilaterally, heart S1-S2, present, rhythmic, abdomen soft nontender, nondistended,no lower extremity edema.  Sodium 138, potassium 3.6, chloride 1 1, bicarb 27, glucose 67, BUN 12, creatinine 0.85, white count 8.3, hemoglobin 9.1, hematocrit 29.5, platelets 344. SARS COVID-19 negative.  Urinalysis specific gravity 1.008.  Patient placed on IV fluids and antiemetics with slow improvement in her symptoms, continue to have diarrhea.  Now tolerating well regular diet. Plan for possible left thigh mass biopsy.     Assessment & Plan:   Principal Problem:   Intractable nausea and vomiting Active Problems:   Breast cancer-high grade sarcoma/phylloides tumor   Controlled type 2 diabetes mellitus with hypoglycemia (HCC)   Essential hypertension   Mass of left thigh   1. Metastatic poorly differentiated spindle cell neoplasm, affecting upper gastrointestinal tract. Complicated with intractable nausea, vomiting and diarrhea.  NEW Left thigh mass 6,3 cm, necrotic.   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 Metastatic disease diagnosed in March 2022, positive right lung mass, right supraclavicular, hilar, pericardiac and left adrenal mass. Hypermetabolic duodenal wall thickening and hypermetabolic periportal adenopathy,hypermetabolism in the proximal stomach and proximal jejunum.  Completed radiation therapy 10/24/2020  Plan for chemotherapy, first dose expected 11/09/2020.  Patient was referred to [facility] for possible stomach biopsy.   No further nausea or vomiting, no diarrhea and tolerating small bites of regular food. Improved left jaw pain.  Continue pain control with scheduled ibuprofen along with as needed oxycodone and cyclobenzaprine.   Sp left thigh mass biopsy, pending result. Follow with oncology recommendations.   2. HTN.continue with metoprolol and lisinopril  3. Dyslipidemia. Continue with statin therapy.   4. Hypokalemia, hypernatremia and contraction metabolic alkalosis.Patient's po intake has improved.   6. Chronic anemia, malignancy related.follow cell count as outpatient.  7. T2DM. Hgb A1c was 7.1 in 2017, her glucose now has been elevated up to 259, will add insulin sliding scale and will discontinue IV fluids with dextrose.    Status is: Inpatient  Remains inpatient appropriate because:Inpatient level of care appropriate due to severity of illness   Dispo: The patient is from: Home              Anticipated d/c is to: Home              Patient currently is not medically stable to d/c.   Difficult to place patient No Possible dc home in the next 24 hrs.    DVT prophylaxis: Enoxaparin   Code Status:   full  Family Communication:  No family at the bedside      Consultants:   Oncology  IR   Procedures:   Left thigh mass biopsy      Subjective: Patient with improvement in po intake, no nausea or vomiting and no diarrhea, no chest pain or dyspnea. Left jaw pain has improved.   Objective: Vitals:   11/12/20 0354 11/12/20 1413 11/12/20 1949 11/13/20 0402  BP: 132/73 130/84 135/76 128/66  Pulse: (!) 104 99 100 97  Resp: 20 14 20 20   Temp: 98.2 F (36.8 C) 98.2 F (36.8 C) 98.1 F (36.7 C) 98 F (36.7 C)  TempSrc:  Oral Oral Oral  SpO2: 99% 98% 96% 96%  Weight:      Height:        Intake/Output Summary (Last 24 hours) at 11/13/2020 1313 Last data filed at 11/13/2020 3976 Gross per 24 hour  Intake 912.5 ml  Output --  Net 912.5 ml   Filed Weights   11/08/20 1655  Weight: 63.5 kg    Examination:   General: Not in pain or dyspnea, deconditioned  Neurology: Awake and alert, non focal  E ENT: mild pallor, no icterus, oral mucosa moist Cardiovascular: No JVD. S1-S2 present, rhythmic, no gallops, rubs, or murmurs. No lower extremity edema. Pulmonary:  Positive breath sounds bilaterally, with wheezing, rhonchi or rales. Gastrointestinal. Abdomen soft and non tender Skin. No rashes Musculoskeletal: no joint deformities     Data Reviewed: I have personally reviewed following labs and imaging studies  CBC: Recent Labs  Lab 11/08/20 1758 11/09/20 0527  WBC 8.3 6.4  NEUTROABS 6.1  --   HGB 9.1* 8.4*  HCT 29.5* 27.2*  MCV 87.0 87.5  PLT 344 734   Basic Metabolic Panel: Recent Labs  Lab 11/08/20 1758 11/09/20 0527 11/10/20 0853 11/11/20 0501  NA 138 145 141 136  K 3.6 3.4* 4.7 4.4  CL 101 106 110 103  CO2 27 30 26 23   GLUCOSE 67* 67* 219* 191*  BUN 12 10 7* 7*  CREATININE 0.85 0.72 0.76 0.71  CALCIUM 9.4 9.4 9.1 8.6*  MG  --   --  1.6*  --    GFR: Estimated Creatinine Clearance: 53.6 mL/min (by C-G formula based on SCr of 0.71 mg/dL). Liver Function Tests: Recent Labs  Lab 11/08/20 1758  AST 13*  ALT 12  ALKPHOS 53  BILITOT 0.4  PROT 6.4*  ALBUMIN 2.8*   No results for  input(s): LIPASE, AMYLASE in the last 168 hours. No results for input(s): AMMONIA in the last 168 hours. Coagulation Profile: Recent Labs  Lab 11/13/20 0324  INR 1.1   Cardiac Enzymes: No results for input(s): CKTOTAL, CKMB, CKMBINDEX, TROPONINI in the last 168 hours. BNP (last 3 results) No results for input(s): PROBNP in the last 8760 hours. HbA1C: No results for input(s): HGBA1C in the last 72 hours. CBG: Recent Labs  Lab 11/12/20 1952 11/13/20 0012 11/13/20 0404 11/13/20 0819 11/13/20 1135  GLUCAP 246* 241* 223* 251* 259*   Lipid Profile: No results for input(s): CHOL, HDL, LDLCALC, TRIG, CHOLHDL, LDLDIRECT in the last 72 hours. Thyroid Function Tests: No results for input(s): TSH, T4TOTAL, FREET4, T3FREE, THYROIDAB in the last 72 hours. Anemia Panel: No results for input(s): VITAMINB12, FOLATE, FERRITIN, TIBC, IRON, RETICCTPCT in the last 72 hours.    Radiology Studies: I have reviewed all of the imaging during this hospital visit personally     Scheduled Meds: . Chlorhexidine Gluconate Cloth  6 each Topical Daily  . [START ON 11/14/2020] enoxaparin (LOVENOX) injection  40 mg Subcutaneous Q24H  . fentaNYL      .  ibuprofen  400 mg Oral TID  . lactobacillus  1 g Oral TID WC  . lipase/protease/amylase  12,000 Units Oral TID AC  . lisinopril  2.5 mg Oral Daily  . metoprolol succinate  50 mg Oral Daily  . midazolam      . pantoprazole  40 mg Oral BID  . rosuvastatin  10 mg Oral Daily   Continuous Infusions: . dextrose 5% lactated ringers 50 mL/hr at 11/13/20 1229  . ondansetron (ZOFRAN) IV       LOS: 4 days        Charmon Thorson Gerome Apley, MD

## 2020-11-13 NOTE — Care Management Important Message (Signed)
Medicare IM printed remotely for Social Work team to give to the patient. 

## 2020-11-13 NOTE — Progress Notes (Signed)
MEDICATION-RELATED CONSULT NOTE   IR Procedure Consult - Anticoagulant/Antiplatelet PTA/Inpatient Med List Review by Pharmacist    Procedure: US guided biopsy of left thigh mass    Completed: 5/9 ~ 1400  Post-Procedural bleeding risk per IR MD assessment:  standard  Antithrombotic medications on inpatient or PTA profile prior to procedure:   Lovenox     Recommended restart time per IR Post-Procedure Guidelines:   Day + 1 (Next AM)   Other considerations:      Plan:     Scheduled to resume at 1000 tomorrow AM.   Thank you for allowing pharmacy to participate in this patient's care.    Ulice Dash D 11/13/20 2:14 PM

## 2020-11-14 ENCOUNTER — Telehealth: Payer: Self-pay | Admitting: Internal Medicine

## 2020-11-14 DIAGNOSIS — R2242 Localized swelling, mass and lump, left lower limb: Secondary | ICD-10-CM | POA: Diagnosis not present

## 2020-11-14 LAB — GLUCOSE, CAPILLARY
Glucose-Capillary: 223 mg/dL — ABNORMAL HIGH (ref 70–99)
Glucose-Capillary: 215 mg/dL — ABNORMAL HIGH (ref 70–99)
Glucose-Capillary: 215 mg/dL — ABNORMAL HIGH (ref 70–99)
Glucose-Capillary: 209 mg/dL — ABNORMAL HIGH (ref 70–99)

## 2020-11-14 LAB — BASIC METABOLIC PANEL
Anion gap: 5 (ref 5–15)
BUN: 9 mg/dL (ref 8–23)
CO2: 27 mmol/L (ref 22–32)
Calcium: 8.5 mg/dL — ABNORMAL LOW (ref 8.9–10.3)
Chloride: 103 mmol/L (ref 98–111)
Creatinine, Ser: 0.73 mg/dL (ref 0.44–1.00)
GFR, Estimated: >60 mL/min (ref >60)
Glucose, Bld: 248 mg/dL — ABNORMAL HIGH (ref 70–99)
Potassium: 3.9 mmol/L (ref 3.5–5.1)
Sodium: 135 mmol/L (ref 135–145)

## 2020-11-14 LAB — SURGICAL PATHOLOGY

## 2020-11-14 LAB — CYTOLOGY - NON PAP

## 2020-11-14 MED ORDER — NYSTATIN 100000 UNIT/ML MT SUSP: 5.0000 mL | Freq: Four times a day (QID) | OROMUCOSAL | 0 refills | Status: AC

## 2020-11-14 MED ORDER — IBUPROFEN 400 MG PO TABS: 400.0000 mg | ORAL_TABLET | Freq: Three times a day (TID) | ORAL | 0 refills | Status: AC | PRN

## 2020-11-14 MED ORDER — MAGIC MOUTHWASH W/LIDOCAINE
5.0000 mL | Freq: Four times a day (QID) | ORAL | Status: DC
  Administered 2020-11-14: 5 mL via ORAL
  Filled 2020-11-14 (×4): qty 5

## 2020-11-14 MED ORDER — FLORANEX PO PACK: 1.0000 g | PACK | Freq: Three times a day (TID) | ORAL | 0 refills | Status: AC

## 2020-11-14 MED ORDER — MAGIC MOUTHWASH W/LIDOCAINE: 5.0000 mL | Freq: Four times a day (QID) | ORAL | 0 refills | Status: AC

## 2020-11-14 MED ORDER — NYSTATIN 100000 UNIT/ML MT SUSP
5.0000 mL | Freq: Four times a day (QID) | OROMUCOSAL | Status: DC
  Administered 2020-11-14: 500000 [IU] via ORAL
  Filled 2020-11-14: qty 5

## 2020-11-14 MED ORDER — INSULIN GLARGINE 100 UNIT/ML ~~LOC~~ SOLN: 25.0000 [IU] | Freq: Every morning | SUBCUTANEOUS | 11 refills | Status: AC

## 2020-11-14 MED ORDER — SODIUM BICARBONATE/SODIUM CHLORIDE MOUTHWASH: 1.0000 "application " | Freq: Four times a day (QID) | OROMUCOSAL | 0 refills | Status: AC

## 2020-11-14 MED ORDER — FLUCONAZOLE 100 MG PO TABS
100.0000 mg | ORAL_TABLET | Freq: Every day | ORAL | Status: DC
  Administered 2020-11-14: 100 mg via ORAL
  Filled 2020-11-14: qty 1

## 2020-11-14 MED ORDER — HEPARIN SOD (PORK) LOCK FLUSH 100 UNIT/ML IV SOLN
500.0000 [IU] | INTRAVENOUS | Status: DC | PRN
  Administered 2020-11-14: 500 [IU]
  Filled 2020-11-14: qty 5

## 2020-11-14 MED ORDER — SODIUM BICARBONATE/SODIUM CHLORIDE MOUTHWASH
Freq: Four times a day (QID) | OROMUCOSAL | Status: DC
  Filled 2020-11-14: qty 1000

## 2020-11-14 MED ORDER — FLUCONAZOLE 100 MG PO TABS: 100.0000 mg | ORAL_TABLET | Freq: Every day | ORAL | 0 refills | Status: AC

## 2020-11-14 MED ORDER — PANCRELIPASE (LIP-PROT-AMYL) 12000-38000 UNITS PO CPEP: 12000.0000 [IU] | ORAL_CAPSULE | Freq: Three times a day (TID) | ORAL | 0 refills | Status: AC

## 2020-11-14 MED ORDER — OXYCODONE-ACETAMINOPHEN 5-325 MG PO TABS: 1.0000 | ORAL_TABLET | Freq: Four times a day (QID) | ORAL | 0 refills | Status: AC | PRN

## 2020-11-14 MED ORDER — HEPARIN SOD (PORK) LOCK FLUSH 100 UNIT/ML IV SOLN
500.0000 [IU] | INTRAVENOUS | Status: DC
  Filled 2020-11-14: qty 5

## 2020-11-14 NOTE — Progress Notes (Signed)
HEMATOLOGY-ONCOLOGY PROGRESS NOTE  SUBJECTIVE: [name] reports that she has mouth pain.  She states that she has not really been able to eat or drink very well because of this.  She is try to take in some clear liquids.  She reports that she is not currently having any nausea or vomiting but she is also not taking in much.  Has not moved her bowels in several days but again she has had very little in.  He notices an increase in the size of her mass in her abdomen and she has some tenderness to this area today.  She underwent a biopsy of her left groin mass yesterday.  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  Denies fevers or chills. Eyes: Denies blurriness of vision Ears, nose, mouth, throat, and face: Reports a sore mouth Respiratory: Denies cough, dyspnea or wheezes Cardiovascular: Denies palpitation, chest discomfort Gastrointestinal: Reports discomfort to her abdominal mass, denies nausea vomiting this morning, reports constipation but has had very little in. Skin: Denies abnormal skin rashes Lymphatics: Denies new lymphadenopathy or easy bruising Neurological:Denies numbness, tingling or new weaknesses Behavioral/Psych: Mood is stable, no new changes  Extremities: No lower extremity edema All other systems were reviewed with the patient and are negative.  I have reviewed the past medical history, past surgical history, social history and family history with  the patient and they are unchanged from previous note.   PHYSICAL EXAMINATION: ECOG PERFORMANCE STATUS: 2 - Symptomatic, <50% confined to bed  Vitals:   11/13/20 2010 11/14/20 0402  BP: 131/64 129/73  Pulse: 96 100  Resp: 18 18  Temp: 97.8 F (36.6 C) 97.7 F (36.5 C)  SpO2: 97% 96%   Filed Weights   11/08/20 1655  Weight: 63.5 kg    Intake/Output from previous day: No intake/output data recorded.  GENERAL:alert, no distress and comfortable SKIN: skin color, texture, turgor are normal, no rashes or significant lesions OROPHARYNX: Oral mucosa is dry, I did not see any obvious mucositis, thrush noted to tongue LYMPH: Large palpable mass in the left inguinal area measuring approximately 6-1/2 cm LUNGS: clear to auscultation and percussion with normal breathing effort HEART: regular rate & rhythm and no murmurs and no lower extremity edema ABDOMEN:abdomen soft, non-tender and normal bowel sounds, palpable mass measuring approximately 3 to 4 cm Musculoskeletal:no cyanosis of digits and no clubbing  NEURO: alert & oriented x 3 with fluent speech, no focal motor/sensory deficits  LABORATORY DATA:  I have reviewed the data as listed CMP Latest Ref Rng & Units 11/14/2020 11/11/2020 11/10/2020  Glucose 70 - 99 mg/dL 248(H) 191(H) 219(H)  BUN 8 - 23 mg/dL 9 7(L) 7(L)  Creatinine 0.44 - 1.00 mg/dL 0.73 0.71 0.76  Sodium 135 - 145 mmol/L 135 136 141  Potassium 3.5 - 5.1 mmol/L 3.9 4.4 4.7  Chloride 98 - 111 mmol/L 103 103 110  CO2 22 - 32 mmol/L _0 Calcium 8.9 -  10.3 mg/dL 8.5(L) 8.6(L) 9.1  Total Protein 6.5 - 8.1 g/dL - - -  Total Bilirubin 0.3 - 1.2 mg/dL - - -  Alkaline Phos 38 - 126 U/L - - -  AST 15 - 41 U/L - - -  ALT 0 - 44 U/L - - -    Lab Results  Component Value Date   WBC 6.4 11/09/2020   HGB 8.4 (L) 11/09/2020   HCT 27.2 (L) 11/09/2020   MCV 87.5 11/09/2020   PLT 297 11/09/2020   NEUTROABS 6.1 11/08/2020    CT HIP LEFT WO CONTRAST  Result Date:  11/11/2020 CLINICAL DATA:  Left thigh mass. History left breast sarcoma. Right upper lobe lung biopsy 09/25/2020 positive for poorly differentiated spindle cell malignant neoplasm, suspected metastasis. EXAM: CT OF THE LEFT HIP WITHOUT CONTRAST TECHNIQUE: Multidetector CT imaging of the left hip was performed according to the standard protocol. Multiplanar CT image reconstructions were also generated. COMPARISON:  Left thigh ultrasound 11/09/2020.  PET-CT 10/02/2020. FINDINGS: Bones/Joint/Cartilage No evidence of acute fracture or dislocation. Minimal left hip degenerative changes with mild spurring of the greater trochanter. No lytic or sclerotic osseous lesions are identified. There is no significant hip joint effusion. Ligaments Suboptimally assessed by CT. Muscles and Tendons Corresponding with the ultrasound finding is a large necrotic mass involving the left gracilis muscle. This mass is well-circumscribed, measuring 5.8 x 6.0 x 6.2 cm. It measures soft tissue attenuation similar to muscle, although there are lower density component centrally consistent with necrosis as correlated with the ultrasound. There is no associated soft tissue emphysema. This mass was not present on the PET-CT performed only 6 weeks ago, although in retrospect, there was a small focus of hypermetabolic activity in this area (image 15/606 of the PET-CT). No other soft tissue masses are identified. Soft tissues Mild subcutaneous edema medially in the left thigh without additional focal fluid collection or mass lesion. No inguinal adenopathy. The visualized internal pelvic contents are unremarkable aside from mild distal colonic diverticulosis. IMPRESSION: 1. The large necrotic mass medially in the left thigh is not further characterized by this noncontrast CT. However, this mass was not present on the PET-CT performed only 6 weeks ago, although there was a small focus of hypermetabolic activity in the left gracilis muscle on that study.  Given the history of sarcoma, this is favored to reflect an aggressive rapidly enlarging metastasis. Soft tissue abscess could have this appearance. Correlate clinically and consider further evaluation with MRI without and with contrast and/or percutaneous sampling. 2. No acute osseous findings. Electronically Signed   By: Richardean Sale M.D.   On: 11/11/2020 11:54   [place] LT LOWER EXTREM LTD SOFT TISSUE NON VASCULAR  Result Date: 11/09/2020 CLINICAL DATA:  Left thigh mass EXAM: ULTRASOUND LEFT LOWER EXTREMITY LIMITED TECHNIQUE: Ultrasound examination of the lower extremity soft tissues was performed in the area of clinical concern. COMPARISON:  None. FINDINGS: There is a complex, thick walled mass with central debris measuring 6.3 x 4.5 x 5.5 cm IMPRESSION: Complex, thick-walled mass of the left thigh, measuring up to 6.3 cm. CT would be helpful for further characterization. This may be a necrotic mass or an abscess. Electronically Signed   By: Ulyses Jarred M.D.   On: 11/09/2020 01:45   [place] CORE BIOPSY (SOFT TISSUE)  Result Date: 11/13/2020 INDICATION: History of poorly differentiated spindle-cell neoplasm now with rapidly enlarging mass within the subcutaneous tissues of the proximal medial aspect of the left thigh. Please perform ultrasound-guided biopsy  for tissue diagnostic purposes. EXAM: ULTRASOUND-GUIDED ASPIRATION AND CORE NEEDLE BIOPSY OF INDETERMINATE PARTIALLY NECROTIC MASS WITHIN THE SUBCUTANEOUS TISSUES OF THE PROXIMAL MEDIAL ASPECT OF THE LEFT THIGH. COMPARISON:  Extremity ultrasound-11/09/2020; hip CT-11/11/2020 MEDICATIONS: None ANESTHESIA/SEDATION: Moderate (conscious) sedation was employed during this procedure. A total of Versed 2 mg and Fentanyl 100 mcg was administered intravenously. Moderate Sedation Time: 14 minutes. The patient's level of consciousness and vital signs were monitored continuously by radiology nursing throughout the procedure under my direct supervision. COMPLICATIONS: None  immediate. TECHNIQUE: Informed written consent was obtained from the patient after a discussion of the risks, benefits and alternatives to treatment. Questions regarding the procedure were encouraged and answered. Initial ultrasound scanning demonstrated unchanged size and appearance of the centrally cystic mass within the subcutaneous tissues of the proximal medial aspect of the left thigh measuring at least 6.7 x 4.9 cm (image 10). An ultrasound image was saved for documentation purposes. The procedure was planned. A timeout was performed prior to the initiation of the procedure. The operative was prepped and draped in the usual sterile fashion, and a sterile drape was applied covering the operative field. A timeout was performed prior to the initiation of the procedure. Local anesthesia was provided with 1% lidocaine with epinephrine. Under direct ultrasound guidance, an 18 gauge core needle device was advanced into the centrally anechoic component of the mass and approximately 30 cc of blood tinged fluid was aspirated. All aspirated fluid was capped and sent to the laboratory for analysis Next, 7 core needle biopsy samples were obtained under direct ultrasound guidance. Acquired samples were placed in both saline as well as formalin and submitted to pathology analysis. The coaxial needle was removed and hemostasis was achieved with manual compression. Post procedure scan was negative for significant hematoma. A dressing was applied. The patient tolerated the procedure well without immediate postprocedural complication. IMPRESSION: Technically successful ultrasound guided biopsy of indeterminate centrally necrotic mass within the subcutaneous tissues the proximal medial aspect the left thigh yielding 30 cc of blood tinged fluid in addition to multiple core needle biopsies. All aspirated fluid was sent to the laboratory for both culture and cytologic analysis. Electronically Signed   By: Sandi Mariscal M.D.   On:  11/13/2020 14:49   IR IMAGING GUIDED PORT INSERTION  Result Date: 11/03/2020 CLINICAL DATA:  METASTATIC SARCOMA OF THE LEFT BREAST EXAM: RIGHT INTERNAL JUGULAR SINGLE LUMEN POWER PORT CATHETER INSERTION Date:  11/03/2020 11/03/2020 12:33 pm Radiologist:  Jerilynn Mages. Daryll Brod, MD Guidance:  Ultrasound and fluoroscopic MEDICATIONS: 1% lidocaine local ANESTHESIA/SEDATION: Versed 2.0 mg IV; Fentanyl 100 mcg IV; Moderate Sedation Time:  27 minutes The patient was continuously monitored during the procedure by the interventional radiology nurse under my direct supervision. FLUOROSCOPY TIME:  0 minutes, 42 seconds (7 mGy) COMPLICATIONS: None immediate. CONTRAST:  None. PROCEDURE: Informed consent was obtained from the patient following explanation of the procedure, risks, benefits and alternatives. The patient understands, agrees and consents for the procedure. All questions were addressed. A time out was performed. Maximal barrier sterile technique utilized including caps, mask, sterile gowns, sterile gloves, large sterile drape, hand hygiene, and 2% chlorhexidine scrub. Under sterile conditions and local anesthesia, right internal jugular micropuncture venous access was performed. Access was performed with ultrasound. Images were obtained for documentation of the patent right internal jugular vein. A guide wire was inserted followed by a transitional dilator. This allowed insertion of a guide wire and catheter into the IVC. Measurements were obtained from the SVC / RA  junction back to the right IJ venotomy site. In the right infraclavicular chest, a subcutaneous pocket was created over the second anterior rib. This was done under sterile conditions and local anesthesia. 1% lidocaine with epinephrine was utilized for this. A 2.5 cm incision was made in the skin. Blunt dissection was performed to create a subcutaneous pocket over the right pectoralis major muscle. The pocket was flushed with saline vigorously. There was  adequate hemostasis. The port catheter was assembled and checked for leakage. The port catheter was secured in the pocket with two retention sutures. The tubing was tunneled subcutaneously to the right venotomy site and inserted into the SVC/RA junction through a valved peel-away sheath. Position was confirmed with fluoroscopy. Images were obtained for documentation. The patient tolerated the procedure well. No immediate complications. Incisions were closed in a two layer fashion with 4 - 0 Vicryl suture. Dermabond was applied to the skin. The port catheter was accessed, blood was aspirated followed by saline and heparin flushes. Needle was removed. A dry sterile dressing was applied. IMPRESSION: Ultrasound and fluoroscopically guided right internal jugular single lumen power port catheter insertion. Tip in the SVC/RA junction. Catheter ready for use. Electronically Signed   By: Jerilynn Mages.  Shick M.D.   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  Unfortunately her PET scan showed hypermetabolic right lung mass with hypermetabolic right suprahilar/right hilar/pericardiac lymph node and left adrenal mass. There was also hypermetabolic duodenal wall thickening with hypermetabolic periportal adenopathy and additional focal areas of hypermetabolism in the proximal stomach and proximal jejunum.These findings are suggestive additional areas of metastatic disease or second malignancy in that area.  She completed palliative radiotherapy to the right lung mass with chest wall invasion under the care of Dr. Sondra Come.  This was  completed in April 2022.  The patient recently met with Dr. Tonye Becket at [facility] on 10/27/20 who is going to discuss her case at a multidisciplinary conference on 11/02/20  to review the PET and distribution of sub-diaphragmatic disease, and consider if she would benefit from a biopsy of stomach or adrenal, to ensure this does not represent a 2nd primary GI malignancy. If consistent with 2nd primary, she would consider referral for en bloc resection of right post chest wall mass if feasible, and then appropriate treatment of any GI malignancy. If 2nd primary she mentioned it may also be appropriate to consider genetics eval. However, they mostly discussed if this is felt to be c/w malignant phyllodes tumor by consensus, she would recommend Doxil (or alternatively gemcitabine), balancing her age, comorbidities and histology. She is going to continue to follow with [facility] concurrently. She is scheduled for EUS 11/16/2020.  At her last visit, she was given the option of referral to palliative care/hospice care versus starting on systemic treatment with Doxil 50 mg/m2 IV every 4 weeks with neulasta support.  The patient is interested in treatment with Doxil.   She was scheduled to receive her first treatment on 11/07/2020, but the patient did not show up for appointment because she was not notified about it.  She subsequently developed worsening nausea and vomiting and is now admitted.  The patient is  not currently having any nausea or vomiting but is not taking in much by mouth.  She reports mouth sores today.  She does appear to have some thrush on exam.  Will start the patient on sodium bicarbonate mouth rinses 4 times a day, Magic mouthwash, and fluconazole 100 mg daily x10 days.  The mass in her abdomen appears to be enlarging compared to my prior exam on 5/6.  I think it is important to try to get her to her appointment at [facility] later this week for her EUS if she is medically stable for discharge.  Biopsy of  the left inguinal mass has been completed.  I anticipate this will be consistent with malignancy.  If the patient feels well enough to discharge, she may go home from our standpoint and keep her appointment at [facility].  She is already scheduled for a visit on 5/16 in our office.  I have asked my schedulers to add a lab appointment and to also try to arrange for Doxil on that date.   LOS: 5 days   Mikey Bussing, DNP, AGPCNP-BC, AOCNP 11/14/20

## 2020-11-14 NOTE — Progress Notes (Signed)
Discharge instructions given with stated understanding 

## 2020-11-14 NOTE — Progress Notes (Signed)
Inpatient Diabetes Program Recommendations  AACE/ADA: New Consensus Statement on Inpatient Glycemic Control (2015)  Target Ranges:  Prepandial:   less than 140 mg/dL      Peak postprandial:   less than 180 mg/dL (1-2 hours)      Critically ill patients:  140 - 180 mg/dL   Lab Results  Component Value Date   GLUCAP 215 (H) 11/14/2020   HGBA1C 5.8 (H) 11/13/2020    Review of Glycemic Control Results for [name], [name] [facility] (MRN [id number]) as of 11/14/2020 12:16  Ref. Range 11/13/2020 21:33 11/13/2020 23:46 11/14/2020 04:00 11/14/2020 07:36 11/14/2020 11:13  Glucose-Capillary Latest Ref Range: 70 - 99 mg/dL 233 (H) 238 (H) 223 (H) 215 (H) 215 (H)   Diabetes history:  DM2 Outpatient Diabetes medications: Lantus 55 units QAM Janumet 50-500 mg BID Current orders for Inpatient glycemic control:  Novolog 0-9 units TID  Inpatient Diabetes Program Recommendations:     Lantus 10 units daily   Will continue to follow while inpatient.  Thank you, Reche Dixon, RN, BSN Diabetes Coordinator Inpatient Diabetes Program [phone] (team pager from 8a-5p)

## 2020-11-14 NOTE — Discharge Instructions (Signed)
Dehydration, Adult Dehydration is condition in which there is not enough water or other fluids in the body. This happens when a person loses more fluids than he or she takes in. Important body parts cannot work right without the right amount of fluids. Any loss of fluids from the body can cause dehydration. Dehydration can be mild, worse, or very bad. It should be treated right away to keep it from getting very bad. What are the causes? This condition may be caused by:  Conditions that cause loss of water or other fluids, such as: ? Watery poop (diarrhea). ? Vomiting. ? Sweating a lot. ? Peeing (urinating) a lot.  Not drinking enough fluids, especially when you: ? Are ill. ? Are doing things that take a lot of energy to do.  Other illnesses and conditions, such as fever or infection.  Certain medicines, such as medicines that take extra fluid out of the body (diuretics).  Lack of safe drinking water.  Not being able to get enough water and food. What increases the risk? The following factors may make you more likely to develop this condition:  Having a long-term (chronic) illness that has not been treated the right way, such as: ? Diabetes. ? Heart disease. ? Kidney disease.  Being 65 years of age or older.  Having a disability.  Living in a place that is high above the ground or sea (high in altitude). The thinner, dried air causes more fluid loss.  Doing exercises that put stress on your body for a long time. What are the signs or symptoms? Symptoms of dehydration depend on how bad it is. Mild or worse dehydration  Thirst.  Dry lips or dry mouth.  Feeling dizzy or light-headed, especially when you stand up from sitting.  Muscle cramps.  Your body making: ? Dark pee (urine). Pee may be the color of tea. ? Less pee than normal. ? Less tears than normal.  Headache. Very bad dehydration  Changes in skin. Skin may: ? Be cold to the touch (clammy). ? Be blotchy  or pale. ? Not go back to normal right after you lightly pinch it and let it go.  Little or no tears, pee, or sweat.  Changes in vital signs, such as: ? Fast breathing. ? Low blood pressure. ? Weak pulse. ? Pulse that is more than 100 beats a minute when you are sitting still.  Other changes, such as: ? Feeling very thirsty. ? Eyes that look hollow (sunken). ? Cold hands and feet. ? Being mixed up (confused). ? Being very tired (lethargic) or having trouble waking from sleep. ? Short-term weight loss. ? Loss of consciousness. How is this treated? Treatment for this condition depends on how bad it is. Treatment should start right away. Do not wait until your condition gets very bad. Very bad dehydration is an emergency. You will need to go to a hospital.  Mild or worse dehydration can be treated at home. You may be asked to: ? Drink more fluids. ? Drink an oral rehydration solution (ORS). This drink helps get the right amounts of fluids and salts and minerals in the blood (electrolytes).  Very bad dehydration can be treated: ? With fluids through an IV tube. ? By getting normal levels of salts and minerals in your blood. This is often done by giving salts and minerals through a tube. The tube is passed through your nose and into your stomach. ? By treating the root cause. Follow these instructions at   home: Oral rehydration solution If told by your doctor, drink an ORS:  Make an ORS. Use instructions on the package.  Start by drinking small amounts, about  cup (120 mL) every 5-10 minutes.  Slowly drink more until you have had the amount that your doctor said to have. Eating and drinking  Drink enough clear fluid to keep your pee pale yellow. If you were told to drink an ORS, finish the ORS first. Then, start slowly drinking other clear fluids. Drink fluids such as: ? Water. Do not drink only water. Doing that can make the salt (sodium) level in your body get too low. ? Water  from ice chips you suck on. ? Fruit juice that you have added water to (diluted). ? Low-calorie sports drinks.  Eat foods that have the right amounts of salts and minerals, such as: ? Bananas. ? Oranges. ? Potatoes. ? Tomatoes. ? Spinach.  Do not drink alcohol.  Avoid: ? Drinks that have a lot of sugar. These include:  High-calorie sports drinks.  Fruit juice that you did not add water to.  Soda.  Caffeine. ? Foods that are greasy or have a lot of fat or sugar.         General instructions  Take over-the-counter and prescription medicines only as told by your doctor.  Do not take salt tablets. Doing that can make the salt level in your body get too high.  Return to your normal activities as told by your doctor. Ask your doctor what activities are safe for you.  Keep all follow-up visits as told by your doctor. This is important. Contact a doctor if:  You have pain in your belly (abdomen) and the pain: ? Gets worse. ? Stays in one place.  You have a rash.  You have a stiff neck.  You get angry or annoyed (irritable) more easily than normal.  You are more tired or have a harder time waking than normal.  You feel: ? Weak or dizzy. ? Very thirsty. Get help right away if you have:  Any symptoms of very bad dehydration.  Symptoms of vomiting, such as: ? You cannot eat or drink without vomiting. ? Your vomiting gets worse or does not go away. ? Your vomit has blood or green stuff in it.  Symptoms that get worse with treatment.  A fever.  A very bad headache.  Problems with peeing or pooping (having a bowel movement), such as: ? Watery poop that gets worse or does not go away. ? Blood in your poop (stool). This may cause poop to look black and tarry. ? Not peeing in 6-8 hours. ? Peeing only a small amount of very dark pee in 6-8 hours.  Trouble breathing. These symptoms may be an emergency. Do not wait to see if the symptoms will go away. Get  medical help right away. Call your local emergency services (911 in the [place] Do not drive yourself to the hospital. Summary  Dehydration is a condition in which there is not enough water or other fluids in the body. This happens when a person loses more fluids than he or she takes in.  Treatment for this condition depends on how bad it is. Treatment should be started right away. Do not wait until your condition gets very bad.  Drink enough clear fluid to keep your pee pale yellow. If you were told to drink an oral rehydration solution (ORS), finish the ORS first. Then, start slowly drinking other clear fluids.    Take over-the-counter and prescription medicines only as told by your doctor.  Get help right away if you have any symptoms of very bad dehydration. This information is not intended to replace advice given to you by your health care provider. Make sure you discuss any questions you have with your health care provider. Document Revised: 02/04/2019 Document Reviewed: 02/04/2019 [employer] Patient Education  2021 [employer]  

## 2020-11-14 NOTE — Telephone Encounter (Signed)
Scheduled appts per 5/10 sch msg. Pt aware.

## 2020-11-14 NOTE — Plan of Care (Signed)

## 2020-11-14 NOTE — Discharge Summary (Signed)
Physician Discharge Summary  Jaleiah Asay KNL:[id number] DOB: 11-26-1940 DOA: 11/08/2020  PCP: [name], MD  Admit date: 11/08/2020 Discharge date: 11/14/2020  Admitted From: Home  Disposition:   Home   Recommendations for Outpatient Follow-up and new medication changes:  1. Follow up with Dr. Inda Merlin in 10 days.  2. Follow up with [facility] on 11/16/20 as scheduled 3. Follow up with Oncology Dr. Earlie Server as scheduled.  4. Patient placed on pancreatic enzymes and probiotics. 5. Ibuprofen for left jaw pain.  6. Reduced glargine dose to 25 units 7. [place]: no   Equipment/Devices: no    Discharge Condition: stable  CODE STATUS: full  Diet recommendation:  Regular as tolerated.   Brief/Interim Summary: Mrs. Manner was admitted to the hospital with a working diagnosis of intractable nausea and vomiting, in the setting of metastatic undifferentiated spindle cell neoplasm, affecting the upper gastrointestinal tract.  80 year old female past medical history for type 2 diabetes mellitus, hypertension, and breast cancer who presented with intractable nausea and vomiting. She reports 3 weeks of progressive and worsening nausea and vomiting, it has been refractory to outpatient management with antiemetics,benzodiazepines and olanzapine. Positive weight loss about 20 pounds over the last 4 weeks,associated with generalized weakness and dizziness. On her initial physical examination blood pressure 117/64, heart rate 108, respiratory rate 18, oxygen saturation 98%,lungs clear to auscultation bilaterally, heart S1-S2, present, rhythmic, abdomen soft nontender, nondistended,no lower extremity edema.  Sodium 138, potassium 3.6, chloride 1 1, bicarb 27, glucose 67, BUN 12, creatinine 0.85, white count 8.3, hemoglobin 9.1, hematocrit 29.5, platelets 344. SARS COVID-19 negative.  Urinalysis specific gravity 1.008.  Patient placed on IV fluids and antiemetics with  slow improvement in her symptoms.  Now tolerating well regular diet.  Noted to have rapid growing masses one in her right upper quadrant and the other one in the left groin, inner thigh. Patient underwent biopsy of left groin mass, resulted with malignancy/sarcoma.  Patient is being discharged to follow-up with [facility] 11/16/2020.  Currently tolerating p.o. diet adequately.  1.  Metastatic poorly differentiated spindle cell neoplasm, affecting the upper gastrointestinal tract.  Complicated with intractable nausea, vomiting and diarrhea. New left thigh mass 6.3 cm, necrotic. 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 Metastatic disease diagnosed in March 2022, positive right lung mass, right supraclavicular, hilar, pericardiac and left adrenal mass. Hypermetabolic duodenal wall thickening and hypermetabolic periportal adenopathy,hypermetabolism in the proximal stomach and proximal jejunum.  Completed radiation therapy 10/24/2020  Plan for chemotherapy, first dose expected 11/09/2020.  Patient was referred to [facility] for possible stomach biopsy.   Patient received supportive medical therapy with intravenous fluids, antiacids, analgesics and antiemetics.  She also received antidiarrheal agents.  She was placed on probiotics and pancreatic enzymes with slowly improvement of her gastrointestinal symptoms.  At her discharge she does have a left jaw pain, but still able to tolerate p.o., liquids and solids. Patient is being discharged home, follow-up with [facility] 5/12 for further work-up. The biopsy results from her left thigh mass was positive for malignant spindle cell neoplasm, the morphology and history consistent with metastasis of the patient's breast sarcoma.  2.  Hypertension.  Continue blood pressure  control metoprolol lisinopril, discontinue diuretics.  3.  Dyslipidemia.  Continue statin therapy.  4.  Hypokalemia/hyponatremia/contraction metabolic alkalosis.  Patient's p.o. intake slowly improved, patient received intravenous fluids along with  potassium chloride.  At her discharge sodium 135, potassium 3.9, chloride 103, bicarb 27, glucose 248, BUN 9, creatinine 0.73.  5.  Chronic anemia, malignancy related.  Her cell count remained stable, follow-up as an outpatient.  6.  Uncontrolled type 2 diabetes mellitus, hyperglycemia.  Hemoglobin A1c 7.1 in 2017.  Patient received insulin sliding scale during her hospitalization.  7.  Suspected oral candidiasis.  Patient placed on nystatin and oral fluconazole.  Follow-up as an outpatient.   Discharge Diagnoses:  Principal Problem:   Intractable nausea and vomiting Active Problems:   Breast cancer-high grade sarcoma/phylloides tumor   Controlled type 2 diabetes mellitus with hypoglycemia (HCC)   Essential hypertension   Mass of left thigh    Discharge Instructions   Allergies as of 11/14/2020      Reactions   Abaloparatide Other (See Comments)   Contrast Media [iodinated Diagnostic Agents] Hives, Nausea And Vomiting   Patient reports that reaction was hives at time on contrast administration   Empagliflozin Other (See Comments)   Epinephrine Other (See Comments)   DIZZY AND ALMOST SYNCOPE   Exenatide Other (See Comments)   Liraglutide Other (See Comments)   Parathyroid Hormone (recomb) Other (See Comments)   Sulfa Antibiotics Hives      Medication List    STOP taking these medications   furosemide 40 MG tablet Commonly known as: LASIX   Janumet 50-500 MG tablet Generic drug: sitaGLIPtin-metformin   lisinopril 5 MG tablet Commonly known as: ZESTRIL   LORazepam 0.5 MG tablet Commonly known as: ATIVAN   methocarbamol 500 MG tablet Commonly known as: ROBAXIN   OLANZapine zydis 10 MG disintegrating tablet Commonly  known as: ZYPREXA   potassium chloride SA 20 MEQ tablet Commonly known as: KLOR-CON     TAKE these medications   BD Insulin Syringe U/F 31G X 5/16" 1 ML Misc Generic drug: Insulin Syringe-Needle U-100 USE WITH INJECTIONS   cetirizine 10 MG tablet Commonly known as: ZYRTEC Take 10 mg by mouth daily.   Cholecalciferol 50 MCG (2000 UT) Caps Take 1 capsule by mouth daily.   fentaNYL 25 MCG/HR Commonly known as: Thayer 1 patch onto the skin every 3 (three) days.   fluconazole 100 MG tablet Commonly known as: DIFLUCAN Take 1 tablet (100 mg total) by mouth daily for 14 days. Start taking on: Nov 15, 2020   gabapentin 100 MG capsule Commonly known as: Neurontin Take 1 capsule (100 mg total) by mouth 3 (three) times daily.   HYDROmorphone 4 MG tablet Commonly known as: Dilaudid Take 1 tablet (4 mg total) by mouth every 4 (four) hours as needed for severe pain.   ibuprofen 400 MG tablet Commonly known as: ADVIL Take 1 tablet (400 mg total) by mouth every 8 (eight) hours as needed for moderate pain (facial pain).   insulin glargine 100 UNIT/ML injection Commonly known as: LANTUS Inject 0.25 mLs (25 Units total) into the skin every morning. What changed: how much to take   lactobacillus Pack Take 1 packet (1 g total) by mouth 3 (three) times daily with meals for 15 days.   lidocaine-prilocaine cream Commonly known as: EMLA Apply 1 application topically as needed.   lipase/protease/amylase 12000-38000 units Cpep capsule Commonly known as: CREON Take 1 capsule (12,000 Units total) by mouth 3 (three) times daily before meals for 15 days.   Lyllana 0.025 MG/24HR Generic drug: estradiol Place 1 patch onto the skin 2 (two) times a week.   magic mouthwash w/lidocaine Soln Take 5  mLs by mouth 4 (four) times daily.   metoprolol succinate 50 MG 24 hr tablet Commonly known as: TOPROL-XL Take 50 mg by mouth daily. Take with or immediately following a meal.    nystatin 100000 UNIT/ML suspension Commonly known as: MYCOSTATIN Take 5 mLs (500,000 Units total) by mouth 4 (four) times daily for 15 days.   oxyCODONE-acetaminophen 5-325 MG tablet Commonly known as: PERCOCET/ROXICET Take 1 tablet by mouth every 6 (six) hours as needed for moderate pain. What changed: reasons to take this   rosuvastatin 40 MG tablet Commonly known as: CRESTOR Take 10 mg by mouth daily.   sodium bicarbonate/sodium chloride Soln 1 application by Mouth Rinse route 4 (four) times daily.       Allergies  Allergen Reactions  . Abaloparatide Other (See Comments)  . Contrast Media [Iodinated Diagnostic Agents] Hives and Nausea And Vomiting    Patient reports that reaction was hives at time on contrast administration  . Empagliflozin Other (See Comments)  . Epinephrine Other (See Comments)    DIZZY AND ALMOST SYNCOPE  . Exenatide Other (See Comments)  . Liraglutide Other (See Comments)  . Parathyroid Hormone (Recomb) Other (See Comments)  . Sulfa Antibiotics Hives    Consultations:  Oncology   IR    Procedures/Studies: CT HIP LEFT WO CONTRAST  Result Date: 11/11/2020 CLINICAL DATA:  Left thigh mass. History left breast sarcoma. Right upper lobe lung biopsy 09/25/2020 positive for poorly differentiated spindle cell malignant neoplasm, suspected metastasis. EXAM: CT OF THE LEFT HIP WITHOUT CONTRAST TECHNIQUE: Multidetector CT imaging of the left hip was performed according to the standard protocol. Multiplanar CT image reconstructions were also generated. COMPARISON:  Left thigh ultrasound 11/09/2020.  PET-CT 10/02/2020. FINDINGS: Bones/Joint/Cartilage No evidence of acute fracture or dislocation. Minimal left hip degenerative changes with mild spurring of the greater trochanter. No lytic or sclerotic osseous lesions are identified. There is no significant hip joint effusion. Ligaments Suboptimally assessed by CT. Muscles and Tendons Corresponding with the  ultrasound finding is a large necrotic mass involving the left gracilis muscle. This mass is well-circumscribed, measuring 5.8 x 6.0 x 6.2 cm. It measures soft tissue attenuation similar to muscle, although there are lower density component centrally consistent with necrosis as correlated with the ultrasound. There is no associated soft tissue emphysema. This mass was not present on the PET-CT performed only 6 weeks ago, although in retrospect, there was a small focus of hypermetabolic activity in this area (image 15/606 of the PET-CT). No other soft tissue masses are identified. Soft tissues Mild subcutaneous edema medially in the left thigh without additional focal fluid collection or mass lesion. No inguinal adenopathy. The visualized internal pelvic contents are unremarkable aside from mild distal colonic diverticulosis. IMPRESSION: 1. The large necrotic mass medially in the left thigh is not further characterized by this noncontrast CT. However, this mass was not present on the PET-CT performed only 6 weeks ago, although there was a small focus of hypermetabolic activity in the left gracilis muscle on that study. Given the history of sarcoma, this is favored to reflect an aggressive rapidly enlarging metastasis. Soft tissue abscess could have this appearance. Correlate clinically and consider further evaluation with MRI without and with contrast and/or percutaneous sampling. 2. No acute osseous findings. Electronically Signed   By: Richardean Sale M.D.   On: 11/11/2020 11:54   [place] LT LOWER EXTREM LTD SOFT TISSUE NON VASCULAR  Result Date: 11/09/2020 CLINICAL DATA:  Left thigh mass EXAM: ULTRASOUND LEFT LOWER EXTREMITY  LIMITED TECHNIQUE: Ultrasound examination of the lower extremity soft tissues was performed in the area of clinical concern. COMPARISON:  None. FINDINGS: There is a complex, thick walled mass with central debris measuring 6.3 x 4.5 x 5.5 cm IMPRESSION: Complex, thick-walled mass of the left  thigh, measuring up to 6.3 cm. CT would be helpful for further characterization. This may be a necrotic mass or an abscess. Electronically Signed   By: Ulyses Jarred M.D.   On: 11/09/2020 01:45   [place] CORE BIOPSY (SOFT TISSUE)  Result Date: 11/13/2020 INDICATION: History of poorly differentiated spindle-cell neoplasm now with rapidly enlarging mass within the subcutaneous tissues of the proximal medial aspect of the left thigh. Please perform ultrasound-guided biopsy for tissue diagnostic purposes. EXAM: ULTRASOUND-GUIDED ASPIRATION AND CORE NEEDLE BIOPSY OF INDETERMINATE PARTIALLY NECROTIC MASS WITHIN THE SUBCUTANEOUS TISSUES OF THE PROXIMAL MEDIAL ASPECT OF THE LEFT THIGH. COMPARISON:  Extremity ultrasound-11/09/2020; hip CT-11/11/2020 MEDICATIONS: None ANESTHESIA/SEDATION: Moderate (conscious) sedation was employed during this procedure. A total of Versed 2 mg and Fentanyl 100 mcg was administered intravenously. Moderate Sedation Time: 14 minutes. The patient's level of consciousness and vital signs were monitored continuously by radiology nursing throughout the procedure under my direct supervision. COMPLICATIONS: None immediate. TECHNIQUE: Informed written consent was obtained from the patient after a discussion of the risks, benefits and alternatives to treatment. Questions regarding the procedure were encouraged and answered. Initial ultrasound scanning demonstrated unchanged size and appearance of the centrally cystic mass within the subcutaneous tissues of the proximal medial aspect of the left thigh measuring at least 6.7 x 4.9 cm (image 10). An ultrasound image was saved for documentation purposes. The procedure was planned. A timeout was performed prior to the initiation of the procedure. The operative was prepped and draped in the usual sterile fashion, and a sterile drape was applied covering the operative field. A timeout was performed prior to the initiation of the procedure. Local anesthesia was  provided with 1% lidocaine with epinephrine. Under direct ultrasound guidance, an 18 gauge core needle device was advanced into the centrally anechoic component of the mass and approximately 30 cc of blood tinged fluid was aspirated. All aspirated fluid was capped and sent to the laboratory for analysis Next, 7 core needle biopsy samples were obtained under direct ultrasound guidance. Acquired samples were placed in both saline as well as formalin and submitted to pathology analysis. The coaxial needle was removed and hemostasis was achieved with manual compression. Post procedure scan was negative for significant hematoma. A dressing was applied. The patient tolerated the procedure well without immediate postprocedural complication. IMPRESSION: Technically successful ultrasound guided biopsy of indeterminate centrally necrotic mass within the subcutaneous tissues the proximal medial aspect the left thigh yielding 30 cc of blood tinged fluid in addition to multiple core needle biopsies. All aspirated fluid was sent to the laboratory for both culture and cytologic analysis. Electronically Signed   By: Sandi Mariscal M.D.   On: 11/13/2020 14:49   IR IMAGING GUIDED PORT INSERTION  Result Date: 11/03/2020 CLINICAL DATA:  METASTATIC SARCOMA OF THE LEFT BREAST EXAM: RIGHT INTERNAL JUGULAR SINGLE LUMEN POWER PORT CATHETER INSERTION Date:  11/03/2020 11/03/2020 12:33 pm Radiologist:  Jerilynn Mages. Daryll Brod, MD Guidance:  Ultrasound and fluoroscopic MEDICATIONS: 1% lidocaine local ANESTHESIA/SEDATION: Versed 2.0 mg IV; Fentanyl 100 mcg IV; Moderate Sedation Time:  27 minutes The patient was continuously monitored during the procedure by the interventional radiology nurse under my direct supervision. FLUOROSCOPY TIME:  0 minutes, 42 seconds (7 mGy) COMPLICATIONS:  None immediate. CONTRAST:  None. PROCEDURE: Informed consent was obtained from the patient following explanation of the procedure, risks, benefits and alternatives. The  patient understands, agrees and consents for the procedure. All questions were addressed. A time out was performed. Maximal barrier sterile technique utilized including caps, mask, sterile gowns, sterile gloves, large sterile drape, hand hygiene, and 2% chlorhexidine scrub. Under sterile conditions and local anesthesia, right internal jugular micropuncture venous access was performed. Access was performed with ultrasound. Images were obtained for documentation of the patent right internal jugular vein. A guide wire was inserted followed by a transitional dilator. This allowed insertion of a guide wire and catheter into the IVC. Measurements were obtained from the SVC / RA junction back to the right IJ venotomy site. In the right infraclavicular chest, a subcutaneous pocket was created over the second anterior rib. This was done under sterile conditions and local anesthesia. 1% lidocaine with epinephrine was utilized for this. A 2.5 cm incision was made in the skin. Blunt dissection was performed to create a subcutaneous pocket over the right pectoralis major muscle. The pocket was flushed with saline vigorously. There was adequate hemostasis. The port catheter was assembled and checked for leakage. The port catheter was secured in the pocket with two retention sutures. The tubing was tunneled subcutaneously to the right venotomy site and inserted into the SVC/RA junction through a valved peel-away sheath. Position was confirmed with fluoroscopy. Images were obtained for documentation. The patient tolerated the procedure well. No immediate complications. Incisions were closed in a two layer fashion with 4 - 0 Vicryl suture. Dermabond was applied to the skin. The port catheter was accessed, blood was aspirated followed by saline and heparin flushes. Needle was removed. A dry sterile dressing was applied. IMPRESSION: Ultrasound and fluoroscopically guided right internal jugular single lumen power port catheter  insertion. Tip in the SVC/RA junction. Catheter ready for use. Electronically Signed   By: Jerilynn Mages.  Shick M.D.   On: 11/03/2020 13:09      Procedures: left thigh mass bi0psy   Subjective: Patient is feeling better, continue to have pain on left jaw, but able to eat and drink. No nausea or vomiting. No diarrhea.   Discharge Exam: Vitals:   11/13/20 2010 11/14/20 0402  BP: 131/64 129/73  Pulse: 96 100  Resp: 18 18  Temp: 97.8 F (36.6 C) 97.7 F (36.5 C)  SpO2: 97% 96%   Vitals:   11/13/20 1352 11/13/20 1424 11/13/20 2010 11/14/20 0402  BP: 126/61 (!) 131/101 131/64 129/73  Pulse:  99 96 100  Resp:  (!) 22 18 18   Temp:  98.2 F (36.8 C) 97.8 F (36.6 C) 97.7 F (36.5 C)  TempSrc:    Oral  SpO2: 97% 99% 97% 96%  Weight:      Height:        General: Not in pain or dyspnea  Neurology: Awake and alert, non focal  E ENT: positive pallor, no icterus, oral mucosa moist Cardiovascular: No JVD. S1-S2 present, rhythmic, no gallops, rubs, or murmurs. No lower extremity edema. Pulmonary: positive breath sounds bilaterally, adequate air movement, no wheezing, rhonchi or rales. Gastrointestinal. Abdomen soft and non tender Skin. No rashes Musculoskeletal: no joint deformities   The results of significant diagnostics from this hospitalization (including imaging, microbiology, ancillary and laboratory) are listed below for reference.     Microbiology: Recent Results (from the past 240 hour(s))  Resp Panel by RT-PCR (Flu A&B, Covid) Nasopharyngeal Swab     Status:  None   Collection Time: 11/08/20 11:10 PM   Specimen: Nasopharyngeal Swab; Nasopharyngeal(NP) swabs in vial transport medium  Result Value Ref Range Status   SARS Coronavirus 2 by RT PCR NEGATIVE NEGATIVE Final    Comment: (NOTE) SARS-CoV-2 target nucleic acids are NOT DETECTED.  The SARS-CoV-2 RNA is generally detectable in upper respiratory specimens during the acute phase of infection. The lowest concentration of  SARS-CoV-2 viral copies this assay can detect is 138 copies/mL. A negative result does not preclude SARS-Cov-2 infection and should not be used as the sole basis for treatment or other patient management decisions. A negative result may occur with  improper specimen collection/handling, submission of specimen other than nasopharyngeal swab, presence of viral mutation(s) within the areas targeted by this assay, and inadequate number of viral copies(<138 copies/mL). A negative result must be combined with clinical observations, patient history, and epidemiological information. The expected result is Negative.  Fact Sheet for Patients:  EntrepreneurPulse.com.au  Fact Sheet for Healthcare Providers:  IncredibleEmployment.be  This test is no t yet approved or cleared by the [place] FDA and  has been authorized for detection and/or diagnosis of SARS-CoV-2 by FDA under an Emergency Use Authorization (EUA). This EUA will remain  in effect (meaning this test can be used) for the duration of the COVID-19 declaration under Section 564(b)(1) of the Act, 21 U.S.C.section 360bbb-3(b)(1), unless the authorization is terminated  or revoked sooner.       Influenza A by PCR NEGATIVE NEGATIVE Final   Influenza B by PCR NEGATIVE NEGATIVE Final    Comment: (NOTE) The Xpert Xpress SARS-CoV-2/FLU/RSV plus assay is intended as an aid in the diagnosis of influenza from Nasopharyngeal swab specimens and should not be used as a sole basis for treatment. Nasal washings and aspirates are unacceptable for Xpert Xpress SARS-CoV-2/FLU/RSV testing.  Fact Sheet for Patients: EntrepreneurPulse.com.au  Fact Sheet for Healthcare Providers: IncredibleEmployment.be  This test is not yet approved or cleared by the [place] FDA and has been authorized for detection and/or diagnosis of SARS-CoV-2 by FDA under an Emergency Use  Authorization (EUA). This EUA will remain in effect (meaning this test can be used) for the duration of the COVID-19 declaration under Section 564(b)(1) of the Act, 21 U.S.C. section 360bbb-3(b)(1), unless the authorization is terminated or revoked.  Performed at [facility], [place] [address] [place], [place] [address]   Aerobic/Anaerobic Culture (surgical/deep wound)     Status: None (Preliminary result)   Collection Time: 11/13/20  1:55 PM   Specimen: Abscess  Result Value Ref Range Status   Specimen Description   Final    ABSCESS  THIGH Performed at [facility] [address], [place], [place] [address]    Special Requests   Final    NONE Performed at [facility], [place] [address], [place], [place] [address]    Gram Stain   Final    ABUNDANT WBC PRESENT,BOTH PMN AND MONONUCLEAR NO ORGANISMS SEEN    Culture   Final    NO GROWTH < 24 HOURS Performed at [facility] [address], [place],  [address]    Report Status PENDING  Incomplete     Labs: BNP (last 3 results) No results for input(s): BNP in the last 8760 hours. Basic Metabolic Panel: Recent Labs  Lab 11/08/20 1758 11/09/20 0527 11/10/20 0853 11/11/20 0501 11/14/20 0324  NA 138 145 141 136 135  K 3.6 3.4* 4.7 4.4 3.9  CL 101 106 110 103 103  CO2 27 30 26 23 27   GLUCOSE 67* 67* 219* 191* 248*  BUN 12 10 7* 7* 9  CREATININE 0.85 0.72 0.76 0.71 0.73  CALCIUM 9.4 9.4 9.1 8.6* 8.5*  MG  --   --  1.6*  --   --    Liver Function Tests: Recent Labs  Lab 11/08/20 1758  AST 13*  ALT 12  ALKPHOS 53  BILITOT 0.4  PROT 6.4*  ALBUMIN 2.8*   No results for input(s): LIPASE, AMYLASE in the last 168 hours. No results for input(s): AMMONIA in the last 168 hours. CBC: Recent Labs  Lab 11/08/20 1758 11/09/20 0527  WBC 8.3 6.4  NEUTROABS 6.1  --   HGB 9.1* 8.4*  HCT 29.5* 27.2*  MCV 87.0 87.5  PLT 344 297   Cardiac Enzymes: No  results for input(s): CKTOTAL, CKMB, CKMBINDEX, TROPONINI in the last 168 hours. BNP: Invalid input(s): POCBNP CBG: Recent Labs  Lab 11/13/20 2133 11/13/20 2346 11/14/20 0400 11/14/20 0736 11/14/20 1113  GLUCAP 233* 238* 223* 215* 215*   D-Dimer No results for input(s): DDIMER in the last 72 hours. Hgb A1c Recent Labs    11/13/20 1600  HGBA1C 5.8*   Lipid Profile No results for input(s): CHOL, HDL, LDLCALC, TRIG, CHOLHDL, LDLDIRECT in the last 72 hours. Thyroid function studies No results for input(s): TSH, T4TOTAL, T3FREE, THYROIDAB in the last 72 hours.  Invalid input(s): FREET3 Anemia work up No results for input(s): VITAMINB12, FOLATE, FERRITIN, TIBC, IRON, RETICCTPCT in the last 72 hours. Urinalysis    Component Value Date/Time   COLORURINE YELLOW 11/09/2020 0803   APPEARANCEUR CLEAR 11/09/2020 0803   LABSPEC 1.008 11/09/2020 0803   PHURINE 5.0 11/09/2020 0803   GLUCOSEU NEGATIVE 11/09/2020 0803   HGBUR NEGATIVE 11/09/2020 0803   BILIRUBINUR NEGATIVE 11/09/2020 0803   KETONESUR NEGATIVE 11/09/2020 0803   PROTEINUR NEGATIVE 11/09/2020 0803   UROBILINOGEN 0.2 03/27/2011 1105   NITRITE NEGATIVE 11/09/2020 0803   LEUKOCYTESUR NEGATIVE 11/09/2020 0803   [facility]eps[facility] Invalid input(s): PROCALCITONIN,  WBC,  LACTICIDVEN Microbiology Recent Results (from the past 240 hour(s))  Resp Panel by RT-PCR (Flu A&B, Covid) Nasopharyngeal Swab     Status: None   Collection Time: 11/08/20 11:10 PM   Specimen: Nasopharyngeal Swab; Nasopharyngeal(NP) swabs in vial transport medium  Result Value Ref Range Status   SARS Coronavirus 2 by RT PCR NEGATIVE NEGATIVE Final    Comment: (NOTE) SARS-CoV-2 target nucleic acids are NOT DETECTED.  The SARS-CoV-2 RNA is generally detectable in upper respiratory specimens during the acute phase of infection. The lowest concentration of SARS-CoV-2 viral copies this assay can detect is 138 copies/mL. A negative result does not preclude  SARS-Cov-2 infection and should not be used as the sole basis for treatment or other patient management decisions. A negative result may occur with  improper specimen collection/handling, submission of specimen other than nasopharyngeal swab, presence of viral mutation(s) within the areas targeted by this assay, and inadequate number of viral copies(<138 copies/mL). A negative result must be combined with clinical observations, patient history, and epidemiological information. The expected result is Negative.  Fact Sheet for Patients:  EntrepreneurPulse.com.au  Fact Sheet for Healthcare Providers:  IncredibleEmployment.be  This test is no t yet approved or cleared by the [place] FDA and  has been authorized for detection and/or diagnosis of SARS-CoV-2 by FDA under an Emergency Use Authorization (EUA). This EUA will remain  in effect (meaning this test can  be used) for the duration of the COVID-19 declaration under Section 564(b)(1) of the Act, 21 U.S.C.section 360bbb-3(b)(1), unless the authorization is terminated  or revoked sooner.       Influenza A by PCR NEGATIVE NEGATIVE Final   Influenza B by PCR NEGATIVE NEGATIVE Final    Comment: (NOTE) The Xpert Xpress SARS-CoV-2/FLU/RSV plus assay is intended as an aid in the diagnosis of influenza from Nasopharyngeal swab specimens and should not be used as a sole basis for treatment. Nasal washings and aspirates are unacceptable for Xpert Xpress SARS-CoV-2/FLU/RSV testing.  Fact Sheet for Patients: EntrepreneurPulse.com.au  Fact Sheet for Healthcare Providers: IncredibleEmployment.be  This test is not yet approved or cleared by the [place] FDA and has been authorized for detection and/or diagnosis of SARS-CoV-2 by FDA under an Emergency Use Authorization (EUA). This EUA will remain in effect (meaning this test can be used) for the duration of  the COVID-19 declaration under Section 564(b)(1) of the Act, 21 U.S.C. section 360bbb-3(b)(1), unless the authorization is terminated or revoked.  Performed at [facility], [place] [address] [place], [place] [address]   Aerobic/Anaerobic Culture (surgical/deep wound)     Status: None (Preliminary result)   Collection Time: 11/13/20  1:55 PM   Specimen: Abscess  Result Value Ref Range Status   Specimen Description   Final    ABSCESS  THIGH Performed at [facility] [address], [place], [place] [address]    Special Requests   Final    NONE Performed at [facility], [place] [address], [place], [place] [address]    Gram Stain   Final    ABUNDANT WBC PRESENT,BOTH PMN AND MONONUCLEAR NO ORGANISMS SEEN    Culture   Final    NO GROWTH < 24 HOURS Performed at [facility] [address] [place], [place] [address]    Report Status PENDING  Incomplete     Time coordinating discharge: 45 minutes  SIGNED:   Tawni Millers, MD  [facility] 11/14/2020, 2:44 PM

## 2020-11-18 LAB — AEROBIC/ANAEROBIC CULTURE W GRAM STAIN (SURGICAL/DEEP WOUND): Culture: NO GROWTH

## 2020-11-20 ENCOUNTER — Inpatient Hospital Stay: Payer: Medicare Other | Admitting: Physician Assistant

## 2020-11-20 ENCOUNTER — Encounter: Payer: Self-pay | Admitting: Radiation Oncology

## 2020-11-20 ENCOUNTER — Inpatient Hospital Stay: Payer: Medicare Other

## 2020-11-20 ENCOUNTER — Telehealth: Payer: Self-pay | Admitting: Medical Oncology

## 2020-11-20 NOTE — Telephone Encounter (Deleted)
[name] is

## 2020-11-20 NOTE — Telephone Encounter (Signed)
[name] is still at [facility] and will be going on Hospice after discharge.  She requested FMLA form  f/u .

## 2020-11-21 ENCOUNTER — Inpatient Hospital Stay: Payer: Medicare Other

## 2020-11-23 ENCOUNTER — Telehealth: Payer: Self-pay | Admitting: Medical Oncology

## 2020-11-27 ENCOUNTER — Ambulatory Visit: Payer: Medicare Other | Admitting: Radiation Oncology

## 2020-11-28 ENCOUNTER — Ambulatory Visit: Payer: Medicare Other | Admitting: Gastroenterology

## 2020-11-30 ENCOUNTER — Telehealth: Payer: Self-pay | Admitting: Medical Oncology

## 2020-11-30 NOTE — Telephone Encounter (Signed)
Faxed FMLA form to [employer]

## 2020-12-06 ENCOUNTER — Ambulatory Visit: Payer: Medicare Other

## 2020-12-06 ENCOUNTER — Other Ambulatory Visit: Payer: Medicare Other

## 2020-12-06 ENCOUNTER — Ambulatory Visit: Payer: Medicare Other | Admitting: Internal Medicine

## 2020-12-06 NOTE — Telephone Encounter (Signed)
Pt died this am at [facility].

## 2020-12-06 DEATH — deceased

## 2020-12-08 ENCOUNTER — Ambulatory Visit: Payer: Medicare Other

## 2021-01-03 ENCOUNTER — Other Ambulatory Visit: Payer: Medicare Other

## 2021-01-03 ENCOUNTER — Ambulatory Visit: Payer: Medicare Other | Admitting: Internal Medicine

## 2021-01-03 ENCOUNTER — Ambulatory Visit: Payer: Medicare Other

## 2021-01-05 ENCOUNTER — Ambulatory Visit: Payer: Medicare Other

## 2022-10-11 IMAGING — CT CT BIOPY CORE LUNG/MEDIASTINUM
1 of 2 series · 14 of 32 positions shown, 19 images · non-contrast
Comparison: none

INDICATION: 79-year-old with right lung mass and needs a tissue diagnosis.

[Series 2: i-spiral 5.0 b40f · axial · 0.59mm/px · z∈[+1020,+1237]mm · 14 of 70 slices shown, 19 images]
[im 4/70  soft-tissue]
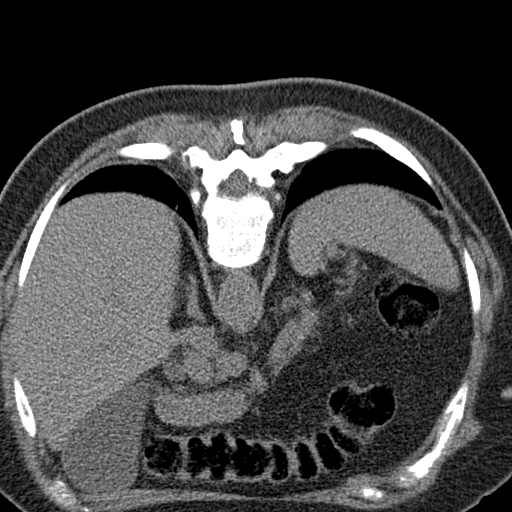
[im 4/70  bone]
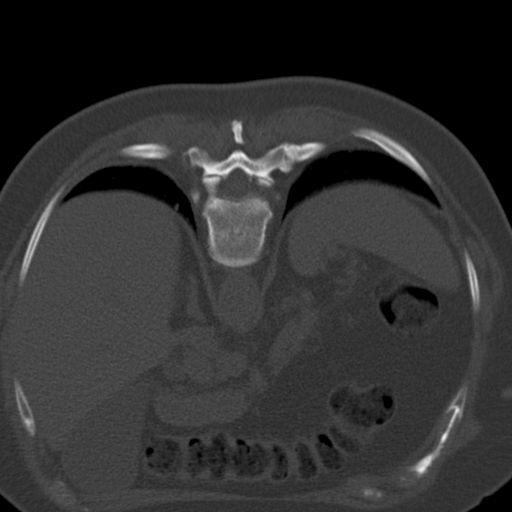
[im 11/70  soft-tissue]
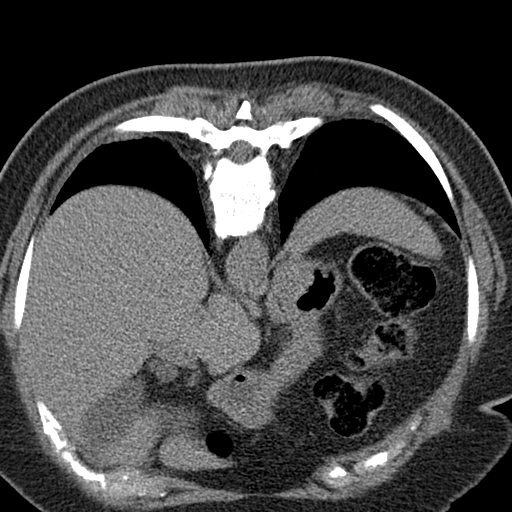
[im 14/70  soft-tissue]
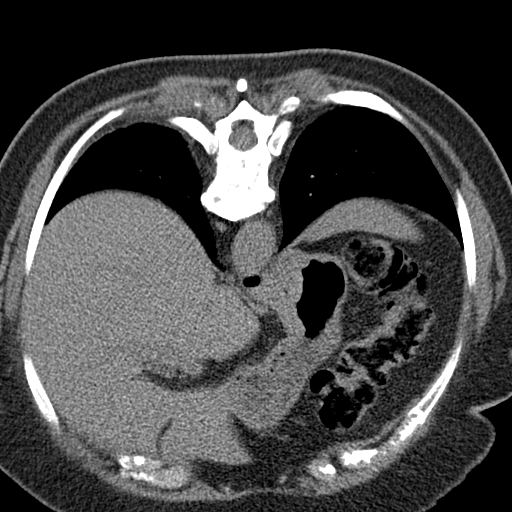
[im 21/70  soft-tissue]
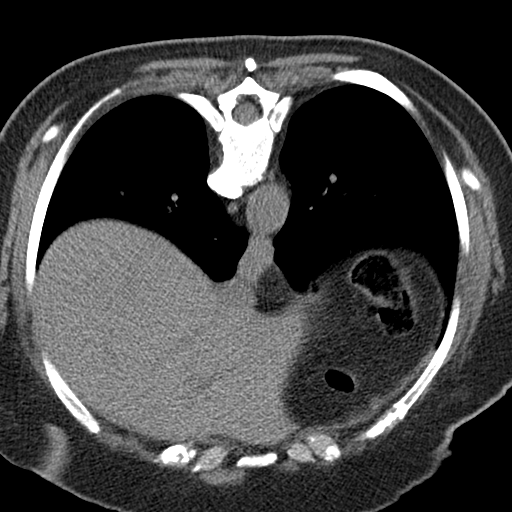
[im 25/70  soft-tissue]
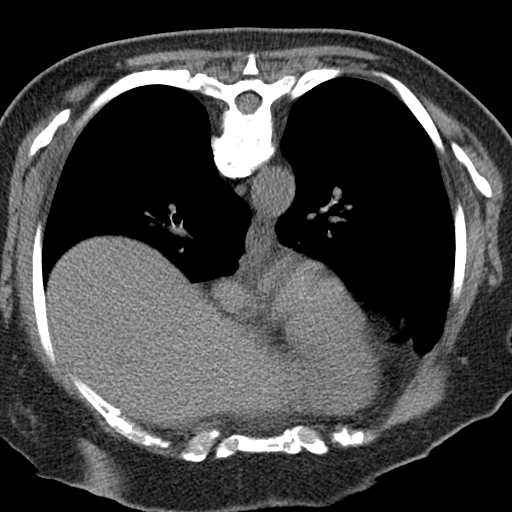
[im 32/70  soft-tissue]
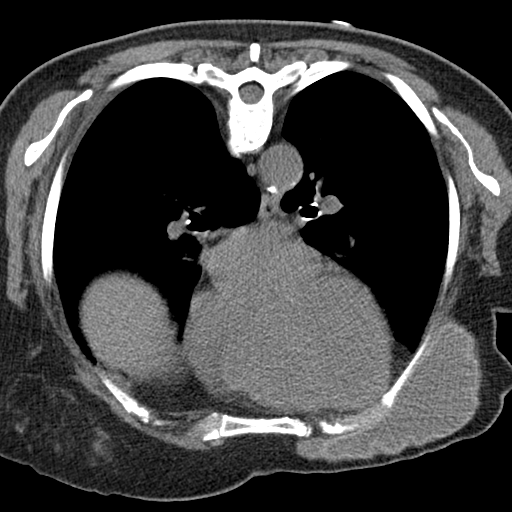
[im 35/70  soft-tissue]
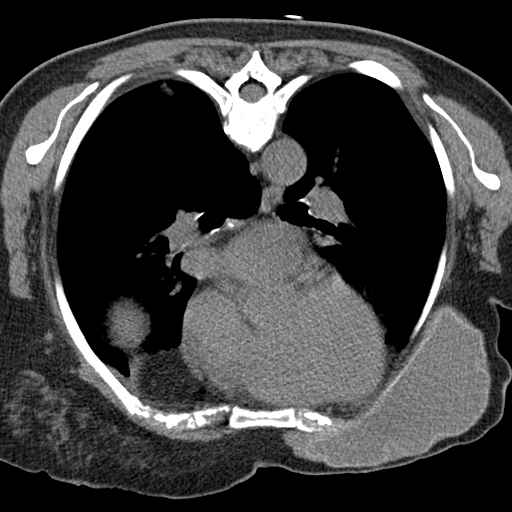
[im 38/70  soft-tissue]
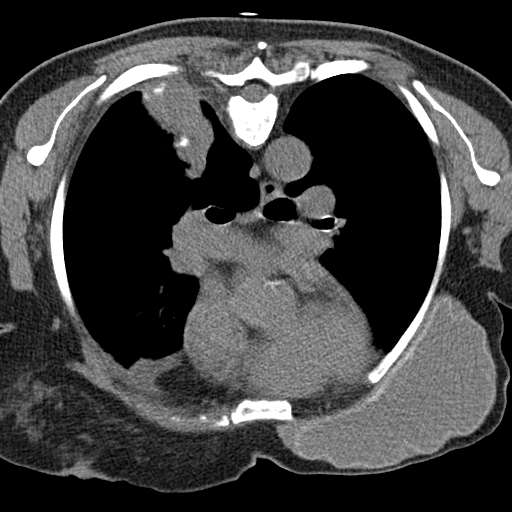
[im 45/70  soft-tissue]
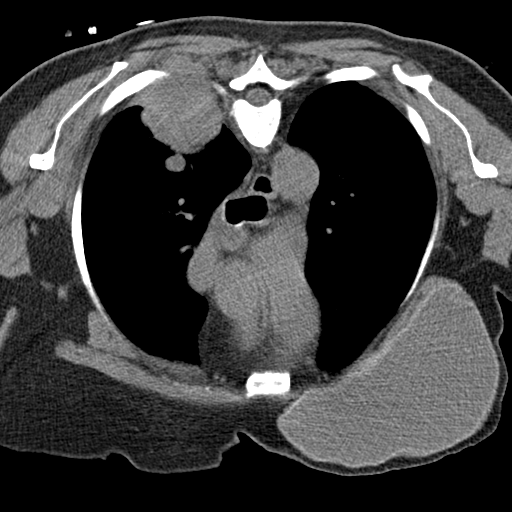
[im 45/70  bone]
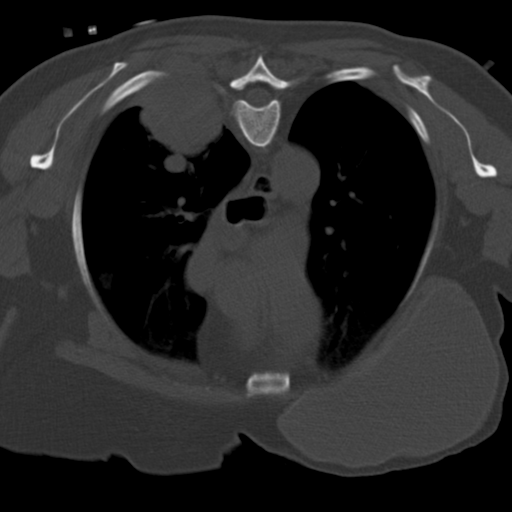
[im 49/70  soft-tissue]
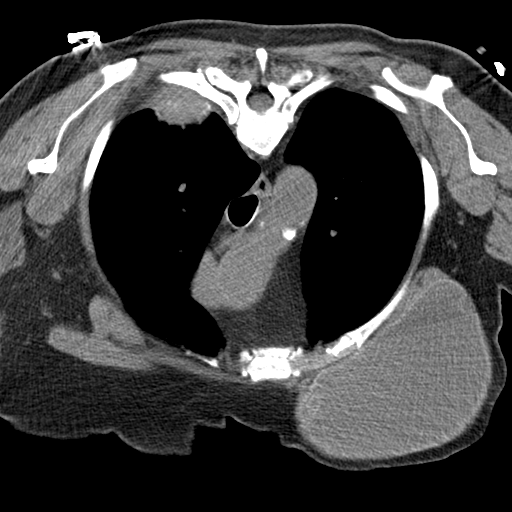
[im 56/70  soft-tissue]
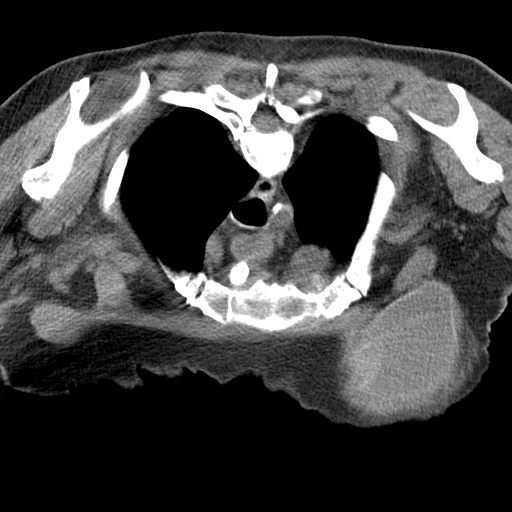
[im 56/70  lung]
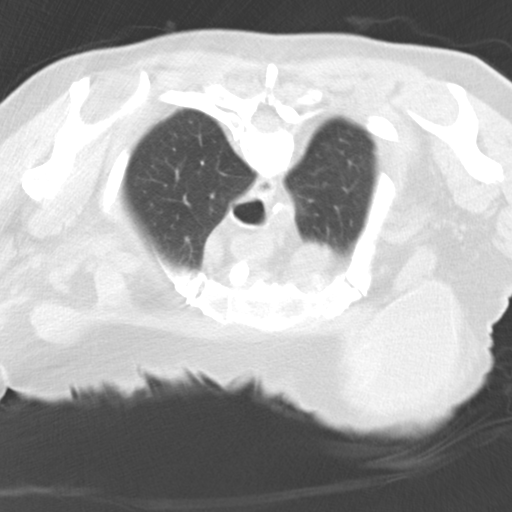
[im 59/70  soft-tissue]
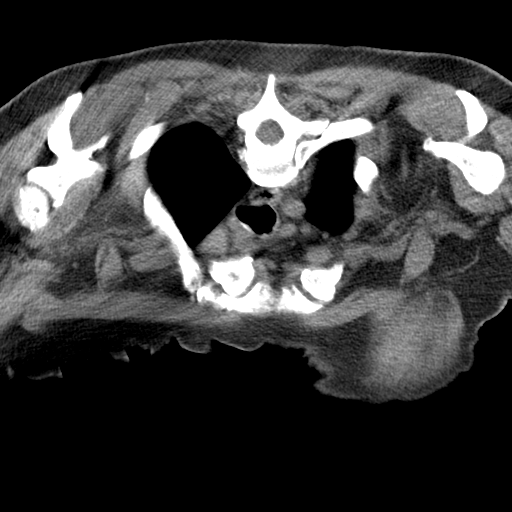
[im 59/70  lung]
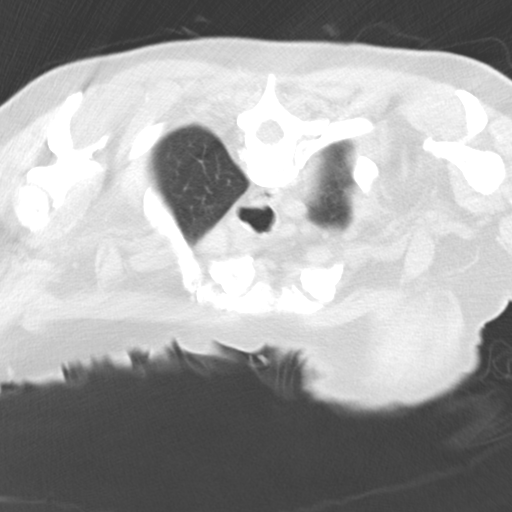
[im 63/70  lung]
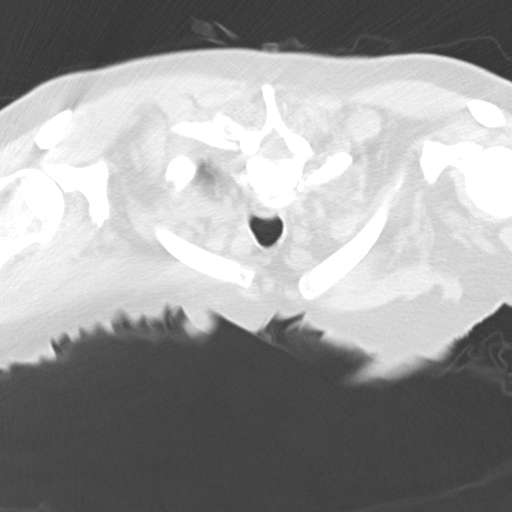
[im 66/70  soft-tissue]
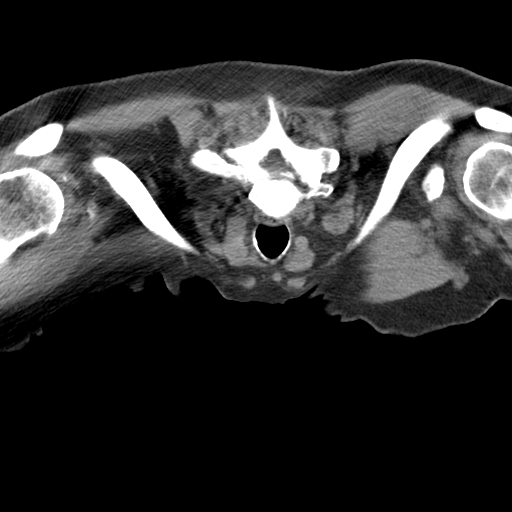
[im 66/70  lung]
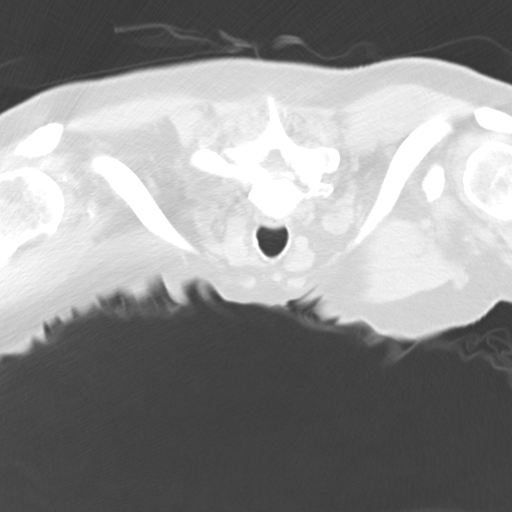

[14 of 32 positions shown; findings below may reference images not displayed]

EXAM:
CT-GUIDED CORE BIOPSY OF RIGHT LUNG MASS

MEDICATIONS:
Moderate sedation

ANESTHESIA/SEDATION:
Moderate (conscious) sedation was employed during this procedure. A
total of Versed 2.0 mg and Fentanyl 100 mcg was administered
intravenously.

Moderate Sedation Time: 20 minutes. The patient's level of
consciousness and vital signs were monitored continuously by
radiology nursing throughout the procedure under my direct
supervision.

FLUOROSCOPY TIME:  None

COMPLICATIONS:
None immediate.

PROCEDURE:
Informed written consent was obtained from the patient after a
thorough discussion of the procedural risks, benefits and
alternatives. All questions were addressed. A timeout was performed
prior to the initiation of the procedure.

Patient was placed prone. CT images through the chest were obtained.
Right posterior lung mass was identified and targeted. The right
side of the back was prepped with chlorhexidine and sterile field
was created. Skin and soft tissues were anesthetized using 1%
lidocaine. Using CT guidance, a 17 gauge coaxial needle was directed
into the right lung mass. Three core biopsies were obtained with an
18 gauge core device. Specimens placed in formalin. 17 gauge coaxial
needle was removed without complication. Bandage placed over the
puncture site.
FINDINGS: Large pleural-based mass in the posterior right upper lobe. Mass
appears to be extending into the posterior chest wall. Coaxial
needle was directed into the mass lesion. Three core samples were
obtained. No evidence for pneumothorax on the final images.
IMPRESSION: CT-guided core biopsy of the right lung mass.
